# Patient Record
Sex: Female | Born: 1935 | Race: White | Hispanic: No | Smoking: Never smoker
Health system: Southern US, Community
[De-identification: ages and names within clinical notes are randomized; demographics above are authoritative.]

## PROBLEM LIST (undated history)

## (undated) DIAGNOSIS — N6019 Diffuse cystic mastopathy of unspecified breast: Secondary | ICD-10-CM

## (undated) DIAGNOSIS — F0390 Unspecified dementia without behavioral disturbance: Secondary | ICD-10-CM

## (undated) DIAGNOSIS — M6250 Muscle wasting and atrophy, not elsewhere classified, unspecified site: Secondary | ICD-10-CM

## (undated) DIAGNOSIS — M0600 Rheumatoid arthritis without rheumatoid factor, unspecified site: Secondary | ICD-10-CM

## (undated) DIAGNOSIS — C801 Malignant (primary) neoplasm, unspecified: Secondary | ICD-10-CM

## (undated) DIAGNOSIS — M1990 Unspecified osteoarthritis, unspecified site: Secondary | ICD-10-CM

## (undated) DIAGNOSIS — F03A0 Unspecified dementia, mild, without behavioral disturbance, psychotic disturbance, mood disturbance, and anxiety: Secondary | ICD-10-CM

## (undated) DIAGNOSIS — Z8669 Personal history of other diseases of the nervous system and sense organs: Secondary | ICD-10-CM

## (undated) DIAGNOSIS — Z87898 Personal history of other specified conditions: Secondary | ICD-10-CM

## (undated) DIAGNOSIS — K219 Gastro-esophageal reflux disease without esophagitis: Secondary | ICD-10-CM

## (undated) DIAGNOSIS — Z8619 Personal history of other infectious and parasitic diseases: Secondary | ICD-10-CM

## (undated) DIAGNOSIS — I82409 Acute embolism and thrombosis of unspecified deep veins of unspecified lower extremity: Secondary | ICD-10-CM

## (undated) DIAGNOSIS — I10 Essential (primary) hypertension: Secondary | ICD-10-CM

## (undated) DIAGNOSIS — Z853 Personal history of malignant neoplasm of breast: Secondary | ICD-10-CM

## (undated) DIAGNOSIS — E785 Hyperlipidemia, unspecified: Secondary | ICD-10-CM

## (undated) DIAGNOSIS — M810 Age-related osteoporosis without current pathological fracture: Secondary | ICD-10-CM

## (undated) HISTORY — DX: Muscle wasting and atrophy, not elsewhere classified, unspecified site: M62.50

## (undated) HISTORY — DX: Unspecified osteoarthritis, unspecified site: M19.90

## (undated) HISTORY — DX: Personal history of other specified conditions: Z87.898

## (undated) HISTORY — PX: TOTAL ABDOMINAL HYSTERECTOMY W/ BILATERAL SALPINGOOPHORECTOMY: SHX83

## (undated) HISTORY — PX: CHOLECYSTECTOMY: SHX55

## (undated) HISTORY — DX: Unspecified dementia without behavioral disturbance: F03.90

## (undated) HISTORY — DX: Personal history of malignant neoplasm of breast: Z85.3

## (undated) HISTORY — DX: Age-related osteoporosis without current pathological fracture: M81.0

## (undated) HISTORY — DX: Personal history of other diseases of the nervous system and sense organs: Z86.69

## (undated) HISTORY — DX: Rheumatoid arthritis without rheumatoid factor, unspecified site: M06.00

## (undated) HISTORY — PX: LUMBAR LAMINECTOMY: SHX95

## (undated) HISTORY — DX: Diffuse cystic mastopathy of unspecified breast: N60.19

## (undated) HISTORY — PX: APPENDECTOMY: SHX54

## (undated) HISTORY — PX: MASTECTOMY: SHX3

## (undated) HISTORY — DX: Unspecified dementia, mild, without behavioral disturbance, psychotic disturbance, mood disturbance, and anxiety: F03.A0

## (undated) HISTORY — PX: VASCULAR SURGERY: SHX849

## (undated) HISTORY — DX: Hyperlipidemia, unspecified: E78.5

## (undated) HISTORY — DX: Gastro-esophageal reflux disease without esophagitis: K21.9

## (undated) HISTORY — DX: Personal history of other infectious and parasitic diseases: Z86.19

## (undated) HISTORY — DX: Acute embolism and thrombosis of unspecified deep veins of unspecified lower extremity: I82.409

---

## 1995-06-28 HISTORY — PX: LAPAROSCOPIC NISSEN FUNDOPLICATION: SHX1932

## 1999-12-29 DIAGNOSIS — Z87898 Personal history of other specified conditions: Secondary | ICD-10-CM

## 1999-12-29 HISTORY — DX: Personal history of other specified conditions: Z87.898

## 2004-06-03 ENCOUNTER — Ambulatory Visit: Payer: Self-pay

## 2004-10-09 ENCOUNTER — Emergency Department: Payer: Self-pay | Admitting: Unknown Physician Specialty

## 2004-10-10 ENCOUNTER — Other Ambulatory Visit: Payer: Self-pay

## 2005-05-30 ENCOUNTER — Ambulatory Visit: Payer: Self-pay | Admitting: Ophthalmology

## 2005-06-27 DIAGNOSIS — I82409 Acute embolism and thrombosis of unspecified deep veins of unspecified lower extremity: Secondary | ICD-10-CM

## 2005-06-27 HISTORY — DX: Acute embolism and thrombosis of unspecified deep veins of unspecified lower extremity: I82.409

## 2005-07-12 ENCOUNTER — Observation Stay: Payer: Self-pay | Admitting: Internal Medicine

## 2005-07-17 ENCOUNTER — Ambulatory Visit: Payer: Self-pay | Admitting: Internal Medicine

## 2006-03-01 ENCOUNTER — Ambulatory Visit: Payer: Self-pay | Admitting: Internal Medicine

## 2006-05-04 ENCOUNTER — Ambulatory Visit: Payer: Self-pay | Admitting: Rheumatology

## 2007-01-09 ENCOUNTER — Ambulatory Visit: Payer: Self-pay

## 2007-01-18 ENCOUNTER — Ambulatory Visit: Payer: Self-pay

## 2007-09-03 ENCOUNTER — Ambulatory Visit: Payer: Self-pay | Admitting: Internal Medicine

## 2007-11-18 ENCOUNTER — Encounter: Admission: RE | Admit: 2007-11-18 | Discharge: 2007-11-18 | Payer: Self-pay | Admitting: Orthopedic Surgery

## 2007-12-12 ENCOUNTER — Observation Stay (HOSPITAL_COMMUNITY): Admission: RE | Admit: 2007-12-12 | Discharge: 2007-12-14 | Payer: Self-pay | Admitting: Orthopedic Surgery

## 2008-01-14 ENCOUNTER — Encounter: Payer: Self-pay | Admitting: Orthopedic Surgery

## 2008-01-28 ENCOUNTER — Encounter: Payer: Self-pay | Admitting: Orthopedic Surgery

## 2008-07-08 ENCOUNTER — Ambulatory Visit: Payer: Self-pay | Admitting: Internal Medicine

## 2008-07-24 ENCOUNTER — Ambulatory Visit: Payer: Self-pay | Admitting: Unknown Physician Specialty

## 2008-07-27 ENCOUNTER — Ambulatory Visit: Payer: Self-pay | Admitting: Unknown Physician Specialty

## 2008-07-27 HISTORY — PX: ORIF DISTAL RADIUS FRACTURE: SUR927

## 2009-01-05 ENCOUNTER — Ambulatory Visit: Payer: Self-pay | Admitting: Rheumatology

## 2009-09-04 ENCOUNTER — Emergency Department: Payer: Self-pay | Admitting: Emergency Medicine

## 2010-07-12 NOTE — H&P (Signed)
NAME:  [name] [name], [name] [name]             ACCOUNT NO.:  [id number]   MEDICAL RECORD NO.:  [id number]          PATIENT TYPE:  INP   LOCATION:                               FACILITY:  [facility]   PHYSICIAN:  Georges Lynch. Gioffre, M.D.DATE OF BIRTH:  05/18/1930   DATE OF ADMISSION:  12/12/2007  DATE OF DISCHARGE:                              HISTORY & PHYSICAL   CHIEF COMPLAINT:  Lower back with left leg pain.   HISTORY OF PRESENT ILLNESS:  The patient is a 77 year old female with  long term history related to lower back pain.  She developed left leg  pain.  She also has a little bit of right leg pain.  She has a chronic  tired feeling in both legs.  Evaluation with a CT myelogram shows that  she has severe spinal stenosis at L4-5 with a herniated disk at L4-5 on  the left migrating caudally.  The patient has elected to proceed with  the central decompression at L4-5 with a microdiskectomy at L4-5 on the  left by Dr. Darrelyn Hillock at [facility] on October 15.   LABORATORY DATA:  CODEINE, SULFA, PENICILLIN.   CURRENT MEDICATIONS:  1. Amitriptyline 25 mg p.o. q.h.s.  2. Methotrexate 2.5 mg tablets 6 tablets every Tuesday.  3. Folic acid 1 mg a day.  4. Cozaar 100 mg once a day.  5. Metoprolol 100 mg b.i.d.  6. Aspirin 81 mg a day.  7. Vitamin E.  8. Vitamin C.  9. Calcium plus vitamin D.   PAST MEDICAL HISTORY:  1. Hypertension.  Last stress test 5 years previous.  2. Difficulty sleeping, insomnia.  3. 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  4. History of right lower extremity blood clot in 2007 treated with      heparin and Coumadin.  5. History of breast cancer with bilateral mastectomy.  6. History of jaundice 40 years previous, no history of hepatitis.   REVIEW OF SYSTEMS:  NEUROLOGIC:  Negative for any neurologic issues at  this time.  PULMONARY:  Unremarkable.  CARDIOVASCULAR:  Unremarkable for  any recent chest pain, shortness of breath, last stress test 5 years  previous  was reported normal.  Blood pressure medicines have been  stable.  GI:  She has had esophageal hernia repaired in the past.  She  has had jaundice in the past without any other complaints.  GU:  Unremarkable.  ENDOCRINE:  Unremarkable.  HEMATOLOGIC:  She has had a  blood clot, right lower extremity in 2007, treated with heparin and  Coumadin.   PAST SURGICAL HISTORY:  Includes bilateral mastectomy, cholecystectomy,  hysterectomy, esophageal hernia repair venous stripping in her right  lower extremity and bilateral cataracts removed.  Only complication with  anesthesia was significant nausea and vomiting.   FAMILY MEDICAL HISTORY:  Father is deceased from pancreatic cancer.  Mother is deceased from congestive heart failure.   SOCIAL HISTORY:  The patient is a widow.  She has never smoked or  alcohol.  She lives alone in a [place] home.  She will have friends  helping her postoperatively.   PHYSICAL  EXAMINATION:  VITAL SIGNS:  Height 5 feet 5 inches, weight 162,  blood pressure 140/70, pulse 74 and regular, respiratory rate 12 and  unlabored.  The patient is afebrile.  GENERAL:  The patient is a healthy-appearing female, conscious, alert  and appropriate.  She ambulates with a slow gait.  She easily gets  herself on and off the exam table.  HEENT:  Head was normocephalic.  Pupils equal.  Gross hearing was  intact.  NECK:  Supple, no palpable lymphadenopathy, good range of motion.  CHEST:  She had a slightly hyperkyphotic type appearance.  She had equal  and clear lung sounds.  HEART:  Regular rate and rhythm.  ABDOMEN:  Soft, bowel sounds present.  EXTREMITIES:  Upper extremities had good range of motion at shoulders,  elbows and wrists.  Lower extremities had good range of motion of both  hips, knees and ankles.  Straight leg raises were unremarkable.  NEUROLOGIC:  The patient was conscious, alert and appropriate.  She did  have pain in the lower back with range of motion.  She  was intact to  light touch sensation.  She did have a slight weakness in her hip  flexors on the left and knee extension on the left.  She did have a  jerky type muscle strength release in both lower extremities and upper  extremities.  PERIPHERAL VASCULAR:  Carotid pulses were 2+ no bruits.  Radial pulses  were 2+.  Dorsalis pedis pulses were 1+.  She had some diffuse  varicosities.  Calves were soft and nontender.  She had no pigmentation  changes.  BREAST, RECTAL, GU: Exams deferred at this time.   IMPRESSION:  1. Severe spinal stenosis at L4-5 with disk at L4-5 on the left.  2. Hypertension.  3. Insomnia.  4. 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  5. History of deep venous thrombosis right lower extremity in 2007      treated with Coumadin and heparin.  6. History of breast cancer with bilateral mastectomy.  7. Esophageal hernia repair.  8. History of jaundice 40 years previous.  9. Stress test 5 years previous, normal.   PLAN:  The patient will undergo all routine labs and tests prior to  having a central decompressive lumbar laminectomy at L4-5 for spinal  stenosis and a microdiskectomy at L4-5 on the left for a herniated disk  by Windy Fast A. Darrelyn Hillock, M.D. at [facility] on December 12, 2007.  This patient has been evaluated by her primary care physician at  [facility] and she has been cleared for this surgical procedure.      Jamelle Rushing, P.A.    ______________________________  Georges Lynch Darrelyn Hillock, M.D.    RWK/MEDQ  D:  12/10/2007  T:  12/10/2007  Job:  [id number]

## 2010-07-12 NOTE — Op Note (Signed)
NAMESHONI, QUIJAS              ACCOUNT NO.:  [id number]   MEDICAL RECORD NO.:  [id number]          PATIENT TYPE:  INP   LOCATION:  1621                         FACILITY:  [facility]   PHYSICIAN:  Georges Lynch. Gioffre, M.D.DATE OF BIRTH:  1931-01-28   DATE OF PROCEDURE:  12/12/2007  DATE OF DISCHARGE:                               OPERATIVE REPORT   SURGEON:  Georges Lynch. Darrelyn Hillock, M.D.   ASSISTANT:  Marlowe Kays, M.D.   PREOPERATIVE DIAGNOSES:  1. Spinal stenosis at L3-4.  2. Spinal stenosis at L4-5.  3. Questionable soft herniated disk at L4-5.   POSTOPERATIVE DIAGNOSES:  1. Severe spinal stenosis at L3-4.  2. Severe spinal stenosis at L4-5.   OPERATIONS:  1. Decompressive lumbar laminectomy at L3-4.  2. Decompressive lumbar laminectomy at L4-5.  3. Bilateral foraminotomies at both levels.   PROCEDURE IN DETAIL:  Under general anesthesia with the patient on  spinal frame she had 1 gram of IV Ancef.  At this time a sterile prep  and draping of the back was carried out.  Two needles were placed in the  back for localization purposes and an x-ray was taken.  We first  identified the L3-4, L4-5 spaces.  We then separated the muscle from the  lamina and spinous processes bilaterally.  Following that we went down  and first took another x-ray to verify our position.  We then went down  and inserted self-retaining McCullough retractors.  We then removed the  spinous processes of L3 and L4 at this time.  We then went down and  completed our central hemilaminectomies and identified the ligamentum  flavum.  It was quite tight at L3-4 as well as at 4-5.  Following that  we went down and brought the microscope in, removed the ligamentum  flavum, identified the dura and protected the dura.  We then went and  made sure we decompressed both levels.  We were able now to easily get  the hockey-stick proximally and distally without any resistance.  So we  were wide open proximally and distally.   We went out laterally and  cleaned out the lateral recesses.  We did foraminotomies at bilaterally  as well.  We inspected the 4-5 disk.  There was no herniated disk.  This  was all hard material and mainly from the facets.  We cleaned that out  well.  The nerve roots now were free.  There were no other obstructions.  We thoroughly irrigated out the area, loosely applied some  thrombin-soaked Gelfoam and closed the wound in layers in the usual  fashion except I left a small deep distal part of the wound open for  drainage purposes.  Subcu was closed with 0 Vicryl, skin was closed with  metal staples.  A sterile Neosporin dressing was applied.           ______________________________  Georges Lynch Darrelyn Hillock, M.D.     RAG/MEDQ  D:  12/12/2007  T:  12/13/2007  Job:  [id number]   cc:   Dr Alonna Buckler  [facility]  [place]

## 2010-08-03 ENCOUNTER — Ambulatory Visit: Payer: Self-pay | Admitting: Internal Medicine

## 2010-11-29 LAB — COMPREHENSIVE METABOLIC PANEL
Potassium: 3.9
Sodium: 142
Total Bilirubin: 0.6
Total Protein: 6.2

## 2010-11-29 LAB — URINE CULTURE: Colony Count: NO GROWTH

## 2010-11-29 LAB — URINALYSIS, ROUTINE W REFLEX MICROSCOPIC
Glucose, UA: NEGATIVE
Ketones, ur: NEGATIVE
Specific Gravity, Urine: 1.015
Urobilinogen, UA: 0.2

## 2010-11-29 LAB — HEMOGLOBIN AND HEMATOCRIT, BLOOD
HCT: 36.1
HCT: 39.6
Hemoglobin: 12.3
Hemoglobin: 13.3

## 2010-11-29 LAB — CBC
MCV: 116.5 — ABNORMAL HIGH
Platelets: 213
RDW: 19.8 — ABNORMAL HIGH
WBC: 7

## 2010-11-29 LAB — DIFFERENTIAL
Eosinophils Absolute: 0.1
Neutro Abs: 5
Neutrophils Relative %: 71

## 2010-11-29 LAB — URINE MICROSCOPIC-ADD ON

## 2010-11-29 LAB — PROTIME-INR: INR: 1

## 2011-02-17 ENCOUNTER — Emergency Department: Payer: Self-pay | Admitting: Emergency Medicine

## 2011-02-24 ENCOUNTER — Inpatient Hospital Stay: Payer: Self-pay | Admitting: Internal Medicine

## 2011-03-01 LAB — CREATININE, SERUM: EGFR (African American): >60

## 2011-11-17 ENCOUNTER — Emergency Department: Payer: Self-pay | Admitting: Unknown Physician Specialty

## 2011-11-17 LAB — CBC
MCH: 40.4 pg — ABNORMAL HIGH (ref 26.0–34.0)
MCHC: 35 g/dL (ref 32.0–36.0)
MCV: 116 fL — ABNORMAL HIGH (ref 80–100)
Platelet: 142 10*3/uL — ABNORMAL LOW (ref 150–440)
RDW: 15.5 % — ABNORMAL HIGH (ref 11.5–14.5)
WBC: 6.8 10*3/uL (ref 3.6–11.0)

## 2011-11-17 LAB — URINALYSIS, COMPLETE
Glucose,UR: NEGATIVE mg/dL (ref 0–75)
Protein: 30
RBC,UR: 8 /HPF (ref 0–5)
Specific Gravity: 1.028 (ref 1.003–1.030)
WBC UR: 9 /HPF (ref 0–5)

## 2011-11-17 LAB — COMPREHENSIVE METABOLIC PANEL
Alkaline Phosphatase: 79 U/L (ref 50–136)
BUN: 20 mg/dL — ABNORMAL HIGH (ref 7–18)
Bilirubin,Total: 0.5 mg/dL (ref 0.2–1.0)
Chloride: 99 mmol/L (ref 98–107)
Co2: 28 mmol/L (ref 21–32)
Creatinine: 0.91 mg/dL (ref 0.60–1.30)
EGFR (African American): >60
EGFR (Non-African Amer.): >60
Glucose: 103 mg/dL — ABNORMAL HIGH (ref 65–99)
Osmolality: 275 (ref 275–301)
Sodium: 136 mmol/L (ref 136–145)
Total Protein: 7.5 g/dL (ref 6.4–8.2)

## 2012-05-04 ENCOUNTER — Emergency Department: Payer: Self-pay | Admitting: Internal Medicine

## 2012-05-04 LAB — URINALYSIS, COMPLETE
Blood: NEGATIVE
Glucose,UR: NEGATIVE mg/dL (ref 0–75)
Ketone: NEGATIVE
Nitrite: NEGATIVE
Ph: 7 (ref 4.5–8.0)
Protein: NEGATIVE
RBC,UR: <1 /HPF (ref 0–5)
Squamous Epithelial: <1

## 2012-05-04 LAB — COMPREHENSIVE METABOLIC PANEL
Albumin: 2.7 g/dL — ABNORMAL LOW (ref 3.4–5.0)
Alkaline Phosphatase: 64 U/L (ref 50–136)
BUN: 14 mg/dL (ref 7–18)
Calcium, Total: 6.7 mg/dL — CL (ref 8.5–10.1)
EGFR (African American): >60
SGOT(AST): 24 U/L (ref 15–37)
SGPT (ALT): 23 U/L (ref 12–78)

## 2012-05-04 LAB — CBC
HGB: 12 g/dL (ref 12.0–16.0)
MCH: 40.1 pg — ABNORMAL HIGH (ref 26.0–34.0)
MCHC: 33.8 g/dL (ref 32.0–36.0)
MCV: 118 fL — ABNORMAL HIGH (ref 80–100)
RDW: 15.1 % — ABNORMAL HIGH (ref 11.5–14.5)
WBC: 10.1 10*3/uL (ref 3.6–11.0)

## 2012-05-04 LAB — CK TOTAL AND CKMB (NOT AT ARMC): CK, Total: 48 U/L (ref 21–215)

## 2013-05-13 ENCOUNTER — Ambulatory Visit: Payer: Self-pay | Admitting: Rheumatology

## 2013-07-08 DIAGNOSIS — M48062 Spinal stenosis, lumbar region with neurogenic claudication: Secondary | ICD-10-CM | POA: Insufficient documentation

## 2013-07-08 DIAGNOSIS — M47816 Spondylosis without myelopathy or radiculopathy, lumbar region: Secondary | ICD-10-CM | POA: Insufficient documentation

## 2013-07-08 DIAGNOSIS — M5416 Radiculopathy, lumbar region: Secondary | ICD-10-CM | POA: Insufficient documentation

## 2013-10-01 ENCOUNTER — Emergency Department: Payer: Self-pay | Admitting: Emergency Medicine

## 2013-10-01 LAB — CBC WITH DIFFERENTIAL/PLATELET
BASOS ABS: 0.2 10*3/uL — AB (ref 0.0–0.1)
Basophil %: 2.2 %
EOS PCT: 3.3 %
Eosinophil #: 0.3 10*3/uL (ref 0.0–0.7)
HCT: 44.8 % (ref 35.0–47.0)
HGB: 14.5 g/dL (ref 12.0–16.0)
Lymphocyte #: 1.7 10*3/uL (ref 1.0–3.6)
Lymphocyte %: 18 %
MCH: 38.7 pg — ABNORMAL HIGH (ref 26.0–34.0)
MCHC: 32.4 g/dL (ref 32.0–36.0)
MCV: 120 fL — ABNORMAL HIGH (ref 80–100)
MONO ABS: 1.1 x10 3/mm — AB (ref 0.2–0.9)
Monocyte %: 12.2 %
NEUTROS ABS: 6 10*3/uL (ref 1.4–6.5)
Neutrophil %: 64.3 %
Platelet: 210 10*3/uL (ref 150–440)
RBC: 3.75 10*6/uL — AB (ref 3.80–5.20)
RDW: 15.6 % — ABNORMAL HIGH (ref 11.5–14.5)
WBC: 9.3 10*3/uL (ref 3.6–11.0)

## 2013-10-01 LAB — URINALYSIS, COMPLETE
Bacteria: NONE SEEN
Bilirubin,UR: NEGATIVE
Blood: NEGATIVE
Glucose,UR: NEGATIVE mg/dL (ref 0–75)
Ketone: NEGATIVE
Leukocyte Esterase: NEGATIVE
Nitrite: NEGATIVE
PROTEIN: NEGATIVE
Ph: 7 (ref 4.5–8.0)
SPECIFIC GRAVITY: 1.006 (ref 1.003–1.030)
Squamous Epithelial: NONE SEEN

## 2013-10-01 LAB — BASIC METABOLIC PANEL
Anion Gap: 8 (ref 7–16)
BUN: 13 mg/dL (ref 7–18)
CALCIUM: 8.6 mg/dL (ref 8.5–10.1)
CHLORIDE: 105 mmol/L (ref 98–107)
CREATININE: 0.91 mg/dL (ref 0.60–1.30)
Co2: 25 mmol/L (ref 21–32)
GFR CALC NON AF AMER: 59 — AB
Glucose: 95 mg/dL (ref 65–99)
OSMOLALITY: 276 (ref 275–301)
Potassium: 3.8 mmol/L (ref 3.5–5.1)
Sodium: 138 mmol/L (ref 136–145)

## 2013-10-01 LAB — TROPONIN I

## 2013-10-06 DIAGNOSIS — M810 Age-related osteoporosis without current pathological fracture: Secondary | ICD-10-CM | POA: Insufficient documentation

## 2013-10-06 DIAGNOSIS — K219 Gastro-esophageal reflux disease without esophagitis: Secondary | ICD-10-CM | POA: Insufficient documentation

## 2013-10-06 DIAGNOSIS — I10 Essential (primary) hypertension: Secondary | ICD-10-CM | POA: Insufficient documentation

## 2013-10-06 DIAGNOSIS — E785 Hyperlipidemia, unspecified: Secondary | ICD-10-CM | POA: Insufficient documentation

## 2013-10-21 ENCOUNTER — Emergency Department: Payer: Self-pay | Admitting: Emergency Medicine

## 2013-11-26 ENCOUNTER — Ambulatory Visit: Payer: Self-pay | Admitting: Internal Medicine

## 2013-12-03 ENCOUNTER — Ambulatory Visit: Payer: Self-pay | Admitting: Internal Medicine

## 2014-06-21 NOTE — H&P (Signed)
PATIENT NAME:  [name] [name], [name] [name] MR#:  [id number] DATE OF BIRTH:  Jul 17, 1930  DATE OF ADMISSION:  02/24/2011  ER REFERRING PHYSICIAN: Lenise Arena, MD   PRIMARY CARE PHYSICIAN: Apolonio Schneiders, MD    CHIEF COMPLAINT: Unsteady gait, weakness, generalized weakness, status post fall last week.   HISTORY OF PRESENT ILLNESS: The patient is a 81 year old white female who reports that she was thought to have early dementia and was started on Aricept and Namenda a few weeks ago. She had significant side effects to these medications, and Dr. Arline Asp told her to stop these; but she reports that she had the medications in her medication box and is not sure if she is still taking these or not. However, last week she reports that she was trying to walk and carrying a whole bunch of stuff in both hands and lost balance and fell backwards on her head. She came to the Emergency Department and had a CT scan of the head which was negative. She was sent home. The patient lives at home alone and since then has not done well. The patient reports that her blood pressure has been elevated in the 200s. She reports that she is not able to ambulate, and when she does ambulate she is very unsteady on her feet and she is very weak. She came to the ED today and reported that she had difficulty with voiding. They checked a bladder scan, and she was noted to have urinary retention greater than 400 mL, so a Foley was placed. The patient also had a CT scan of the head which did not show any acute abnormalities. Due to her having difficulty with walking, unsteady gait with a fall last week, I am asked to admit the patient. She otherwise reports that she has not had any fevers.  She had chills earlier this morning. She was unsure if it was chills or just shaking from being very weak. She has not had any cough, no chest pains, no palpitations, no PND. No orthopnea. No abdominal pain. She does report liquid bowel movements for the past few days,  but she reports that she goes once or twice. She denies any urinary burning. She does report that earlier today she had felt like she was unable to void. She otherwise denies any swelling in the legs. No asymmetrical weakness. No visual difficulties.   PAST MEDICAL HISTORY: Significant for: 1. Breast cancer, status post bilateral mastectomy. 2. Osteoporosis. 3. Rheumatoid arthritis. 4. Hypertension.  5. Possible early dementia.   PAST SURGICAL HISTORY:  1. Status post bilateral mastectomy.  2. Status post right leg vein stripping. 3. Status post cholecystectomy.  4. Status post hysterectomy.  5. Status post appendectomy.    ALLERGIES: Codeine, Disalcid, Evista,  Fosamax, Lipitor, penicillin, sulfa, Voltaren and tape.   HOME MEDICATIONS:    1. Folic acid 1 mg daily.  2. Celexa 10 mg daily.  3. Amitriptyline 75 mg daily.  4. Losartan 100 mg, 1 tab p.o. daily.  5. Methotrexate 2.5 mg tabs weekly.  6. Metoprolol tartrate 100 mg, 1 tab p.o. b.i.d.  7. Vitamin C 1000 mg, 1 tab p.o. daily.   SOCIAL HISTORY: She does not smoke. She does not drink. No drugs.   FAMILY HISTORY: Mother died from heart failure. Father died of a massive heart attack.   REVIEW OF SYSTEMS: CONSTITUTIONAL: Denies any fevers. Complains of generalized fatigue, weakness. No pain. No weight loss. No weight gain. EYES: No blurred or double vision. No pain.  No redness. No inflammation. No glaucoma. No cataracts. ENT: No tinnitus. No ear pain. No hearing loss. No seasonal or year-round allergies. No nasal discharge. No snoring. No difficulty with swallowing. RESPIRATORY: No cough. No wheezing. No hemoptysis. No chronic obstructive pulmonary disease. No tuberculosis. CARDIOVASCULAR: No chest pain. No orthopnea. No edema. No arrhythmia. No syncope. GASTROINTESTINAL: No nausea, vomiting, diarrhea. No abdominal pain. No hematemesis. No melena. No ulcers. GU: Denies any dysuria, hematuria, renal calculus, frequency or  incontinence. ENDOCRINE: Denies any polydipsia, nocturia, or thyroid problems. No increase in sweating, heat or cold intolerance. HEME/LYMPH: Denies any anemia, easy bruisability, or bleeding. SKIN: No acne. No rash. No changes in mole, hair or skin.  MUSCULOSKELETAL: Denies pain in the neck, back or shoulder. NEUROLOGICAL: No numbness. No cerebrovascular accident. No transient ischemic attack. No seizure. Does have some memory loss. PSYCHIATRIC: No anxiety. No insomnia. No ADD.   PHYSICAL EXAMINATION:  VITAL SIGNS: Temperature 98, pulse 74, respiratory rate 22. Initial blood pressure on presentation was 224/87.   GENERAL: The patient appears her stated age, appears very weak.   HEENT: Head atraumatic, normocephalic. Pupils are equally round, reactive to light and accommodation. Extraocular movements are intact. Oropharynx is clear without any exudates. Nasal exam shows no erythema or swelling. Ear exam shows no erythema or swelling.   NECK: Atraumatic. No carotid bruits. No thyromegaly.   CARDIOVASCULAR: Regular rate and rhythm. No murmurs, rubs, clicks, or gallops. PMI is not displaced.   LUNGS: Clear to auscultation bilaterally without any rales, rhonchi, wheezing. No accessory muscle usage.   ABDOMEN: Soft, nontender, nondistended. Positive bowel sounds x4.   EXTREMITIES: No clubbing, cyanosis, or edema.   NEUROLOGICAL: Awake, alert, oriented x3. Cranial nerves II through XII are grossly intact. Has generalized weakness but no asymmetrical weakness. Reflexes are 2+.   SKIN: There is no rash.   LYMPHATICS: No lymph nodes palpable.   VASCULAR: Good DP, PT pulses.   GU: Deferred.   RECTAL: Deferred.  PSYCHIATRIC: Not anxious or depressed.  ASSESSMENT AND PLAN: The patient is a 81 year old white female with possible early dementia who has had generalized weakness, difficulty with walking, and fell last week.   1. Progressive weakness and unsteadiness on her feet:  Possibly due to  medications. The patient reports that she was recently started on Namenda and Aricept, which she was told to stop due to side effects, but was possibly still taking this due to these medications being in her medication box, so we will not continue these. The patient also could have a generalized weakness and unsteadiness due to some dehydration. We will give her IV fluids. I will check a TSH and B12 level to make sure there is no metabolic cause for her symptoms.  2. Urinary retention: Could be just due to the patient not being mobile and not getting around too much. At this time she has a Foley in place. She will need a voiding trial in the a.m. She has been on amitriptyline, which can cause urinary retention. I will hold amitriptyline for the time being.  3. Hypertension with wide pulse pressures: Chest x-ray is negative for any mediastinal enlargement. At this time, this could be related to the patient being anxious about her condition. We will continue losartan, metoprolol. We will place her on p.r.n. hydralazine, adjust her regimen as needed.  4. Possible early dementia: Hold Aricept and Namenda for the time being.  5. Rheumatoid arthritis: Continue methotrexate and folic acid as taking at home.  CODE STATUS: FULL CODE.   TIME SPENT: 35 minutes.  ____________________________ Lafonda Mosses Posey Pronto, MD shp:cbb D: 02/24/2011 14:32:04 ET T: 02/24/2011 15:11:17 ET JOB#: [id number]  cc: Rasheedah Reis H. Posey Pronto, MD, <Dictator> Vianne Bulls. Arline Asp, MD Alric Seton MD ELECTRONICALLY SIGNED 03/04/2011 15:22

## 2014-06-21 NOTE — Discharge Summary (Signed)
PATIENT NAME:  [name] [name], [name] [name] MR#:  [id number] DATE OF BIRTH:  [birth year]  DATE OF ADMISSION:  02/27/2011 DATE OF DISCHARGE:  03/01/2011  ADMITTING DIAGNOSIS: Generalized weakness.   DISCHARGE DIAGNOSES:  1. Generalized weakness of unclear etiology, probably deconditioning, questionable brain concussion related. No CVA on MRI status post fall.  2. Abnormal gait. 3. Hypertension, malignant.  4. Renal insufficiency, resolved on IV fluid rehydration.  5. History of breast carcinoma.  6. Osteoporosis.  7. Rheumatoid arthritis. 8. Osteoarthritis. 9. Dementia.  10. Depression.   DISCHARGE CONDITION: Stable.   DISCHARGE MEDICATIONS: The patient is to resume her outpatient medications which are:  1. Methotrexate 2.5 mg tablet 6 pills once a week. 2. Folic acid 1 mg p.o. daily. 3. Vitamin C 1000 mg daily. 4. Losartan 100 mg p.o. daily. 5. Metoprolol tartrate 100 mg p.o. twice daily. 6. Alendronate, unknown dose, weekly.   ADDITIONAL MEDICATIONS:  1. Celexa 20 mg p.o. daily. This is a new dose up from 10 mg p.o. daily.  2. Norvasc 10 mg p.o. daily.  3. Tylenol 650 mg p.o. every four hours as needed.  4. Catapres-TTS one topically weekly.  5. Hydralazine 50 mg p.o. 4 times daily.  DO NOT TAKE: The patient was advised not to take amitriptyline because of concerns of falls and weakness.   HOME OXYGEN: None.   DIET: 2 gram salt, mechanical soft.   PHYSICAL ACTIVITY LIMITATIONS: As tolerated.   REFERRAL: Physical therapy 2 to 7 times a week.  FOLLOW-UP: Follow-up appointment with Dr. Apolonio Schneiders in two days after discharge.    CONSULTANTS:  1. Care Management   2. Physical therapy    RADIOLOGIC STUDIES:  1. Chest x-ray, portable, single view, 02/24/2011, no acute cardiopulmonary abnormality.  2. CT of head without contrast 02/24/2011 showed chronic involutional changes without evidence of acute abnormalities. A scalp hematoma is appreciated along the posterior aspect on the  left. 3. MRI of brain without contrast 02/26/2011 showed no acute intracranial abnormality.  REASON FOR ADMISSION: The patient is a 81 year old Caucasian female with past medical history significant for history of rheumatoid arthritis and osteoarthritis who presented to the hospital with complaints of unsteady gait, weakness, generalized weakness as well as fall a week ago prior to coming to the hospital. Please refer to Dr. Serita Grit admission note on 02/24/2011.   PHYSICAL EXAMINATION: On arrival to the Emergency Room, the patient's temperature was 98, pulse 74, respiratory rate 22, blood pressure 224/87. Physical exam was unremarkable.   LABORATORY, DIAGNOSTIC, AND RADIOLOGICAL DATA: The patient's chest x-ray was unremarkable as well as her head CT. Lab data 02/24/2011 showed glucose elevated to 100, otherwise unremarkable BMP. The patient's liver enzymes were remarkable for mildly elevated AST to 43 and total bilirubin to 1.2. Cardiac enzymes, one set, was within normal limits. CK total was normal at 141. TSH was normal at 0.976. CBC white blood cell count 9.8, hemoglobin 16.0, platelets 209 with high MCV at 117. Influenza test was negative. Urinalysis was unremarkable. Vitamin B12 level was normal at 472. EKG showed normal sinus rhythm at 68 beats per minute, left axis deviation, moderate voltage criteria for LVH, no acute ST-T changes noted.   HOSPITAL COURSE: The patient was admitted to the hospital. Because of her generalized weakness as well as poor gait, physical therapist was consulted who recommended rehab placement. The patient was exercised while she was in the hospital. It was felt that the patient's generalized weakness possibly was related to rheumatoid arthritis, osteoarthritis, deconditioning,  as well as fall and questionable concussion. However, no stroke was noted on CT scan of her head as well as MRI of her brain. The patient is to continue aspirin therapy as well as physical therapy  eventually with hopes of returning back home. She will be discharged to skilled nursing facility today on 03/01/2011. On the day of discharge, the patient's temperature is 98.7, pulse 66, respiration rate 20, blood pressure 131/66, saturation 96% on room air at rest.  1. The patient was noted to have malignant hypertension on arrival to the hospital. Her blood pressure was poorly controlled very long time even despite advancing her blood pressure medications. It was felt that the patient's severe malignant hypertension requiring multiple medications should be addressed maybe even radiologically. The patient may benefit from evaluation of her renal arteries at some point. However, with the addition of all new blood pressure medications her blood pressure improved and today, as mentioned above, the patient's blood pressure systolic was around 976. It is recommended to advance the patient's blood pressure medications as necessary to maintain her at reasonable blood pressure readings.  2. In regards to osteoarthritis as well as rheumatoid arthritis, no changes were made in medication management. The patient did complain of some intermittent pains such as right foot, however, those pains did not preclude her to ambulate and it seemed to be that it was well controlled with Tylenol as needed.  3. The patient also seemed to be depressed. The patient's Celexa was advanced to 20 mg p.o. daily dose with improvement of her depressive mood.  4. As mentioned above, the patient had mild elevation of AST. Initially on arrival to the hospital, 02/24/2011, the patient's AST was slightly elevated to 43. With following the patient's liver enzymes, the patient's AST normalized to 35 by 02/26/2011. It was unclear why the patient's AST was elevated, however, it could have been that patient had mild elevation of CK total which was reflected by elevation of AST.   The patient is being discharged in stable condition with the  above-mentioned medications and follow-up.      TIME SPENT: 40 minutes.   ____________________________ Theodoro Grist, MD rv:drc D: 03/01/2011 12:46:06 ET T: 03/01/2011 13:12:53 ET JOB#: [id number]  cc: Theodoro Grist, MD, <Dictator> Vianne Bulls. Arline Asp, MD Theodoro Grist MD ELECTRONICALLY SIGNED 03/27/2011 7:15

## 2015-03-10 DIAGNOSIS — M0600 Rheumatoid arthritis without rheumatoid factor, unspecified site: Secondary | ICD-10-CM | POA: Diagnosis not present

## 2015-04-05 DIAGNOSIS — H1859 Other hereditary corneal dystrophies: Secondary | ICD-10-CM | POA: Diagnosis not present

## 2015-04-05 DIAGNOSIS — H018 Other specified inflammations of eyelid: Secondary | ICD-10-CM | POA: Diagnosis not present

## 2015-05-26 DIAGNOSIS — M0600 Rheumatoid arthritis without rheumatoid factor, unspecified site: Secondary | ICD-10-CM | POA: Diagnosis not present

## 2015-05-27 DIAGNOSIS — M0600 Rheumatoid arthritis without rheumatoid factor, unspecified site: Secondary | ICD-10-CM | POA: Diagnosis not present

## 2015-06-02 DIAGNOSIS — G5601 Carpal tunnel syndrome, right upper limb: Secondary | ICD-10-CM | POA: Diagnosis not present

## 2015-06-02 DIAGNOSIS — M150 Primary generalized (osteo)arthritis: Secondary | ICD-10-CM | POA: Diagnosis not present

## 2015-06-02 DIAGNOSIS — M0600 Rheumatoid arthritis without rheumatoid factor, unspecified site: Secondary | ICD-10-CM | POA: Diagnosis not present

## 2015-06-02 DIAGNOSIS — M810 Age-related osteoporosis without current pathological fracture: Secondary | ICD-10-CM | POA: Diagnosis not present

## 2015-09-08 DIAGNOSIS — M0600 Rheumatoid arthritis without rheumatoid factor, unspecified site: Secondary | ICD-10-CM | POA: Diagnosis not present

## 2015-11-09 ENCOUNTER — Emergency Department: Payer: PPO

## 2015-11-09 ENCOUNTER — Observation Stay
Admission: EM | Admit: 2015-11-09 | Discharge: 2015-11-10 | Disposition: A | Discharge status: Home / Self Care | Payer: PPO | Attending: Internal Medicine | Admitting: Internal Medicine

## 2015-11-09 DIAGNOSIS — Z90710 Acquired absence of both cervix and uterus: Secondary | ICD-10-CM | POA: Diagnosis not present

## 2015-11-09 DIAGNOSIS — E785 Hyperlipidemia, unspecified: Secondary | ICD-10-CM | POA: Insufficient documentation

## 2015-11-09 DIAGNOSIS — K219 Gastro-esophageal reflux disease without esophagitis: Secondary | ICD-10-CM | POA: Diagnosis not present

## 2015-11-09 DIAGNOSIS — R079 Chest pain, unspecified: Secondary | ICD-10-CM | POA: Diagnosis present

## 2015-11-09 DIAGNOSIS — R0789 Other chest pain: Principal | ICD-10-CM | POA: Insufficient documentation

## 2015-11-09 DIAGNOSIS — Z880 Allergy status to penicillin: Secondary | ICD-10-CM | POA: Insufficient documentation

## 2015-11-09 DIAGNOSIS — M069 Rheumatoid arthritis, unspecified: Secondary | ICD-10-CM | POA: Insufficient documentation

## 2015-11-09 DIAGNOSIS — Z809 Family history of malignant neoplasm, unspecified: Secondary | ICD-10-CM | POA: Diagnosis not present

## 2015-11-09 DIAGNOSIS — Z86718 Personal history of other venous thrombosis and embolism: Secondary | ICD-10-CM | POA: Diagnosis not present

## 2015-11-09 DIAGNOSIS — F329 Major depressive disorder, single episode, unspecified: Secondary | ICD-10-CM | POA: Insufficient documentation

## 2015-11-09 DIAGNOSIS — I700 Atherosclerosis of aorta: Secondary | ICD-10-CM | POA: Diagnosis not present

## 2015-11-09 DIAGNOSIS — Z882 Allergy status to sulfonamides status: Secondary | ICD-10-CM | POA: Diagnosis not present

## 2015-11-09 DIAGNOSIS — I517 Cardiomegaly: Secondary | ICD-10-CM | POA: Diagnosis not present

## 2015-11-09 DIAGNOSIS — E119 Type 2 diabetes mellitus without complications: Secondary | ICD-10-CM | POA: Diagnosis not present

## 2015-11-09 DIAGNOSIS — I209 Angina pectoris, unspecified: Secondary | ICD-10-CM | POA: Diagnosis not present

## 2015-11-09 DIAGNOSIS — Z8249 Family history of ischemic heart disease and other diseases of the circulatory system: Secondary | ICD-10-CM | POA: Insufficient documentation

## 2015-11-09 DIAGNOSIS — Z7982 Long term (current) use of aspirin: Secondary | ICD-10-CM | POA: Insufficient documentation

## 2015-11-09 DIAGNOSIS — Z79899 Other long term (current) drug therapy: Secondary | ICD-10-CM | POA: Diagnosis not present

## 2015-11-09 DIAGNOSIS — I10 Essential (primary) hypertension: Secondary | ICD-10-CM | POA: Insufficient documentation

## 2015-11-09 DIAGNOSIS — Z9049 Acquired absence of other specified parts of digestive tract: Secondary | ICD-10-CM | POA: Diagnosis not present

## 2015-11-09 DIAGNOSIS — R0602 Shortness of breath: Secondary | ICD-10-CM | POA: Diagnosis not present

## 2015-11-09 HISTORY — DX: Essential (primary) hypertension: I10

## 2015-11-09 HISTORY — DX: Malignant (primary) neoplasm, unspecified: C80.1

## 2015-11-09 LAB — BASIC METABOLIC PANEL
Anion gap: 9 (ref 5–15)
BUN: 13 mg/dL (ref 6–20)
CHLORIDE: 103 mmol/L (ref 101–111)
CO2: 26 mmol/L (ref 22–32)
CREATININE: 0.66 mg/dL (ref 0.44–1.00)
Calcium: 9.3 mg/dL (ref 8.9–10.3)
GFR calc Af Amer: >60 mL/min (ref >60)
GFR calc non Af Amer: >60 mL/min (ref >60)
Glucose, Bld: 114 mg/dL — ABNORMAL HIGH (ref 65–99)
Potassium: 3.9 mmol/L (ref 3.5–5.1)
SODIUM: 138 mmol/L (ref 135–145)

## 2015-11-09 LAB — TROPONIN I
Troponin I: <0.03 ng/mL (ref <0.03)
Troponin I: <0.03 ng/mL (ref <0.03)
Troponin I: <0.03 ng/mL (ref <0.03)

## 2015-11-09 LAB — CBC
HCT: 47.1 % — ABNORMAL HIGH (ref 35.0–47.0)
Hemoglobin: 16.6 g/dL — ABNORMAL HIGH (ref 12.0–16.0)
MCH: 40.9 pg — AB (ref 26.0–34.0)
MCHC: 35.2 g/dL (ref 32.0–36.0)
MCV: 116.3 fL — AB (ref 80.0–100.0)
PLATELETS: 196 10*3/uL (ref 150–440)
RBC: 4.05 MIL/uL (ref 3.80–5.20)
RDW: 15.2 % — AB (ref 11.5–14.5)
WBC: 13.6 10*3/uL — ABNORMAL HIGH (ref 3.6–11.0)

## 2015-11-09 MED ORDER — ASPIRIN EC 81 MG PO TBEC
81.0000 mg | DELAYED_RELEASE_TABLET | Freq: Every day | ORAL | Status: DC
  Administered 2015-11-09: 81 mg via ORAL
  Filled 2015-11-09 (×2): qty 1

## 2015-11-09 MED ORDER — FOLIC ACID 1 MG PO TABS
1.0000 mg | ORAL_TABLET | Freq: Every day | ORAL | Status: DC
  Administered 2015-11-10: 1 mg via ORAL
  Filled 2015-11-09: qty 1

## 2015-11-09 MED ORDER — HYDRALAZINE HCL 20 MG/ML IJ SOLN
10.0000 mg | Freq: Four times a day (QID) | INTRAMUSCULAR | Status: DC | PRN
  Administered 2015-11-09: 10 mg via INTRAVENOUS
  Filled 2015-11-09: qty 1

## 2015-11-09 MED ORDER — ENOXAPARIN SODIUM 40 MG/0.4ML ~~LOC~~ SOLN
40.0000 mg | SUBCUTANEOUS | Status: DC
  Administered 2015-11-09: 40 mg via SUBCUTANEOUS
  Filled 2015-11-09: qty 0.4

## 2015-11-09 MED ORDER — ASPIRIN EC 325 MG PO TBEC
325.0000 mg | DELAYED_RELEASE_TABLET | Freq: Once | ORAL | Status: AC
Start: 2015-11-09 — End: 2015-11-09
  Administered 2015-11-09: 325 mg via ORAL
  Filled 2015-11-09: qty 1

## 2015-11-09 MED ORDER — LOSARTAN POTASSIUM 50 MG PO TABS
100.0000 mg | ORAL_TABLET | Freq: Every day | ORAL | Status: DC
  Administered 2015-11-10: 100 mg via ORAL
  Filled 2015-11-09: qty 2

## 2015-11-09 MED ORDER — ONDANSETRON HCL 4 MG/2ML IJ SOLN: 4.0000 mg | Freq: Four times a day (QID) | INTRAMUSCULAR | Status: DC | PRN

## 2015-11-09 MED ORDER — ACETAMINOPHEN 325 MG PO TABS: 650.0000 mg | ORAL_TABLET | ORAL | Status: DC | PRN

## 2015-11-09 MED ORDER — ISOSORBIDE MONONITRATE ER 30 MG PO TB24: 30.0000 mg | ORAL_TABLET | Freq: Every day | ORAL | Status: DC

## 2015-11-09 MED ORDER — AMLODIPINE BESYLATE 10 MG PO TABS
10.0000 mg | ORAL_TABLET | Freq: Every day | ORAL | Status: DC
  Administered 2015-11-10: 10 mg via ORAL
  Filled 2015-11-09: qty 1

## 2015-11-09 MED ORDER — METHOTREXATE 2.5 MG PO TABS: 15.0000 mg | ORAL_TABLET | ORAL | Status: DC

## 2015-11-09 MED ORDER — METOPROLOL TARTRATE 50 MG PO TABS
100.0000 mg | ORAL_TABLET | Freq: Two times a day (BID) | ORAL | Status: DC
  Administered 2015-11-09 – 2015-11-10 (×2): 100 mg via ORAL
  Filled 2015-11-09 (×2): qty 2

## 2015-11-09 MED ORDER — SERTRALINE HCL 100 MG PO TABS
100.0000 mg | ORAL_TABLET | Freq: Every day | ORAL | Status: DC
  Administered 2015-11-10: 100 mg via ORAL
  Filled 2015-11-09: qty 1

## 2015-11-09 MED ORDER — NITROGLYCERIN 2 % TD OINT
1.0000 [in_us] | TOPICAL_OINTMENT | Freq: Once | TRANSDERMAL | Status: AC
  Administered 2015-11-09: 1 [in_us] via TOPICAL
  Filled 2015-11-09: qty 1

## 2015-11-09 MED ORDER — VITAMIN C 500 MG PO TABS
1000.0000 mg | ORAL_TABLET | Freq: Every day | ORAL | Status: DC
  Administered 2015-11-10: 1000 mg via ORAL
  Filled 2015-11-09: qty 2

## 2015-11-09 NOTE — ED Triage Notes (Signed)
Pt to ED from home via EMS c/o chest pain. Per EMS pt woke up about 1AM with mid sternal chest pain without radiation. EMS administered 1 nitro with some relief, and stated BP was 212/70. Pt alert and oriented in no acute distress at this time.

## 2015-11-09 NOTE — Care Management (Signed)
Placed in observation for chest pain.  Troponins so far are negative and cardiology consult is pending

## 2015-11-09 NOTE — H&P (Addendum)
[facility] [place] NAME: [name] [name]    MR#:  [id number]  DATE OF BIRTH:  06/12/1930  DATE OF ADMISSION:  11/09/2015  PRIMARY CARE PHYSICIAN: SPARKS,JEFFREY D, MD   REQUESTING/REFERRING PHYSICIAN: Dr Mariea Clonts  CHIEF COMPLAINT:  Chest pressure at 3 AM today  HISTORY OF PRESENT ILLNESS:  [name] [name]  is a 82 y.o. female with a known history of Hypertension, GERD, rheumatoid arthritis, hyperlipidemia DVT in May 2007 comes in the emergency room after she woken up at 3 AM with chest pressure. Patient symptoms lasted a few minutes. She called EMS when she woke up again at 6:00 feeling uneasy and some lingering chest pressure. Came to the emergency room for several troponin negative EKG does not show acute changes she is being admitted for chest pain rule out. Patient was noted to have systolic blood pressure at 170 to 190s in the ER. She is chest pain-free at present.  PAST MEDICAL HISTORY:   Past Medical History:  Diagnosis Date  . Cancer ([place])   . Hypertension     PAST SURGICAL HISTOIRY:   Past Surgical History:  Procedure Laterality Date  . ABDOMINAL HYSTERECTOMY    . CHOLECYSTECTOMY    . MASTECTOMY    . VASCULAR SURGERY      SOCIAL HISTORY:   Social History  Substance Use Topics  . Smoking status: Never Smoker  . Smokeless tobacco: Never Used  . Alcohol use No    FAMILY HISTORY:   Family History  Problem Relation Age of Onset  . Heart failure Mother   . Cancer Father   . Heart attack Sister     DRUG ALLERGIES:   Allergies  Allergen Reactions  . Sulfa Antibiotics     Pt doesn't remember  . Penicillins Itching    REVIEW OF SYSTEMS:  Review of Systems  Constitutional: Negative for chills, fever and weight loss.  HENT: Negative for ear discharge, ear pain and nosebleeds.   Eyes: Negative for blurred vision, pain and discharge.  Respiratory: Negative for sputum production, shortness of breath, wheezing  and stridor.   Cardiovascular: Positive for chest pain. Negative for palpitations, orthopnea and PND.  Gastrointestinal: Negative for abdominal pain, diarrhea, nausea and vomiting.  Genitourinary: Negative for frequency and urgency.  Musculoskeletal: Negative for back pain and joint pain.  Neurological: Positive for weakness. Negative for sensory change, speech change and focal weakness.  Psychiatric/Behavioral: Negative for depression and hallucinations. The patient is not nervous/anxious.      MEDICATIONS AT HOME:   Prior to Admission medications   Medication Sig Start Date End Date Taking? Authorizing Provider  acetaminophen (TYLENOL) 650 MG CR tablet Take 1,300 mg by mouth 2 (two) times daily.   Yes Historical Provider, MD  amLODipine (NORVASC) 10 MG tablet Take 1 tablet by mouth daily. 10/29/15  Yes Historical Provider, MD  Ascorbic Acid (VITAMIN C) 1000 MG tablet Take 1,000 mg by mouth daily.   Yes Historical Provider, MD  aspirin EC 81 MG tablet Take 81 mg by mouth daily.   Yes Historical Provider, MD  folic acid (FOLVITE) 1 MG tablet Take 1 mg by mouth daily.   Yes Historical Provider, MD  isosorbide mononitrate (IMDUR) 30 MG 24 hr tablet Take 30 mg by mouth daily.   Yes Historical Provider, MD  losartan (COZAAR) 100 MG tablet Take 100 mg by mouth daily.   Yes Historical Provider, MD  methotrexate (RHEUMATREX) 2.5 MG tablet Take 6 tablets  by mouth once a week. 10/28/15  Yes Historical Provider, MD  metoprolol (LOPRESSOR) 100 MG tablet Take 100 mg by mouth 2 (two) times daily.   Yes Historical Provider, MD  sertraline (ZOLOFT) 100 MG tablet Take 100 mg by mouth daily.   Yes Historical Provider, MD      VITAL SIGNS:  Blood pressure (!) 168/54, pulse (!) 49, temperature 98.2 F (36.8 C), temperature source Oral, resp. rate 14, height 5' 3.5" (1.613 m), weight 72.6 kg (160 lb), SpO2 95 %.  PHYSICAL EXAMINATION:  GENERAL:  82 y.o.-year-old patient lying in the bed with no acute  distress.  EYES: Pupils equal, round, reactive to light and accommodation. No scleral icterus. Extraocular muscles intact.  HEENT: Head atraumatic, normocephalic. Oropharynx and nasopharynx clear.  NECK:  Supple, no jugular venous distention. No thyroid enlargement, no tenderness.  LUNGS: Normal breath sounds bilaterally, no wheezing, rales,rhonchi or crepitation. No use of accessory muscles of respiration.  CARDIOVASCULAR: S1, S2 normal. No murmurs, rubs, or gallops.  ABDOMEN: Soft, nontender, nondistended. Bowel sounds present. No organomegaly or mass.  EXTREMITIES: No pedal edema, cyanosis, or clubbing.  NEUROLOGIC: Cranial nerves II through XII are intact. Muscle strength 5/5 in all extremities. Sensation intact. Gait not checked.  PSYCHIATRIC: The patient is alert and oriented x 3.  SKIN: No obvious rash, lesion, or ulcer.   LABORATORY PANEL:   CBC  Recent Labs Lab 11/09/15 1005  WBC 13.6*  HGB 16.6*  HCT 47.1*  PLT 196   ------------------------------------------------------------------------------------------------------------------  Chemistries   Recent Labs Lab 11/09/15 1005  NA 138  K 3.9  CL 103  CO2 26  GLUCOSE 114*  BUN 13  CREATININE 0.66  CALCIUM 9.3   ------------------------------------------------------------------------------------------------------------------  Cardiac Enzymes  Recent Labs Lab 11/09/15 1005  TROPONINI <0.03   ------------------------------------------------------------------------------------------------------------------  RADIOLOGY:  Dg Chest 2 View  Result Date: 11/09/2015 CLINICAL DATA:  Midsternal chest pain beginning around 1 a.m. today associated with shortness of breath. History of hypertension and unspecified malignancy. EXAM: CHEST  2 VIEW COMPARISON:  Chest x-ray of October 01, 2013 FINDINGS: The lungs are adequately inflated. There is no focal infiltrate. The cardiac silhouette is enlarged. The central pulmonary  vascularity is prominent without cephalization. There is calcification in the wall of the thoracic aorta. There is no pleural effusion. The bony structures exhibit no acute abnormalities. IMPRESSION: Mild chronic bronchitic changes without significant hyperinflation nor evidence of pneumonia. Mild cardiomegaly without pulmonary vascular congestion. Aortic atherosclerosis. Electronically Signed   By: David  Martinique M.D.   On: 11/09/2015 10:30    EKG:   NSR, no ST elevation or depression IMPRESSION AND PLAN:   [name] [name]  is a 82 y.o. female with a known history of Hypertension, GERD, rheumatoid arthritis, hyperlipidemia DVT in May 2007 comes in the emergency room after she woken up at 3 AM with chest pressure. Patient symptoms lasted a few minutes. She called EMS when she woke up again at 6:00 feeling uneasy and some lingering chest pressure. Came to the emergency room for several troponin negative EKG does not show acute changes she is being admitted for chest pain rule out.  1. Chest pain  -Risk factor hypertension  and hyperlipidemia -Admit to telemetry floor -Sublingual nitroglycerin aspirin when necessary morphine if needed continue statins -Cardiology consultation with Dr. Clayborn Bigness -Cycle cardiac enzymes 3 -Myoview stress test in the morning if patient remains asymptomatic and troponins are negative  2. malignanr Hypertension continue amlodipine, imdur -prn hydralazine  3. Hyperlipidemia on statins  4. GERD continue PPI  5. Rheumatoid arthritis on methotrexate and folic acid  6. DVT prophylaxis subcutaneous Lovenox All the records are reviewed and case discussed with ED provider. Management plans discussed with the patient, family and they are in agreement.  CODE STATUS: Full TOTAL TIME TAKING CARE OF THIS PATIENT: 45 minutes.    Haeleigh Streiff M.D on 11/09/2015 at 12:23 PM  Between 7am to 6pm - Pager - [phone]  After 6pm go to [email or web address] - password [email or web address]  [facility]  Office  [phone]  CC: Primary care physician; Idelle Crouch, MD

## 2015-11-09 NOTE — Progress Notes (Addendum)
Patient admitted to unit. Oriented to room, call bell, and staff. Bed in lowest position. Fall safety plan reviewed. Full assessment to Epic. Skin assessment verified with Maddie Himes RN. Telemetry box verification with tele clerk and Jamie NT- Box#: [id number]. Will continue to monitor.  Stress test scheduled for tomorrow - per Nuclear Medicine, RN needs to place order for 2 more troponins to be drawn.    *Wallet, cash, cards, keys locked in security - key and form on paper chart. Send at discharge*

## 2015-11-09 NOTE — ED Provider Notes (Signed)
[facility] Emergency Department Provider Note  ____________________________________________  Time seen: Approximately 10:43 AM  I have reviewed the triage vital signs and the nursing notes.   HISTORY  Chief Complaint Chest Pain    HPI [name] [name] is a 82 y.o. female with a history of HTN, remote breast see a, presenting with chest pain. The patient reports that she awoke at 1 AM with a midsternal chest pain that did not radiate, was not associated with shortness of breath, diaphoresis, nausea or vomiting, palpitations, lightheadedness or syncope. It lasted for approximately 2 hours, she went to sleep, and when she awoke at 6:15 the pain had come back. She did take all of her morning medications including her blood pressure medications. On EMS arrival, the patient was given nitroglycerin and her pain completely resolved. She is a symptomatically at this time.  The patient has never had a cardiac catheterization and her last stress test was years ago.  Past Medical History:  Diagnosis Date  . Cancer ([place])   . Hypertension     There are no active problems to display for this patient.   Past Surgical History:  Procedure Laterality Date  . ABDOMINAL HYSTERECTOMY    . CHOLECYSTECTOMY    . MASTECTOMY    . VASCULAR SURGERY        Allergies Sulfa antibiotics and Penicillins  Family History  Problem Relation Age of Onset  . Heart failure Mother   . Cancer Father   . Heart attack Sister     Social History Social History  Substance Use Topics  . Smoking status: Never Smoker  . Smokeless tobacco: Never Used  . Alcohol use No    Review of Systems Constitutional: No fever/chills.No lightheadedness or syncope. Eyes: No visual changes. No blurred or double vision ENT: No sore throat. No congestion or rhinorrhea. Cardiovascular: Denies chest pain. Denies palpitations. Respiratory: Denies shortness of breath.  No cough. Gastrointestinal: No  abdominal pain.  No nausea, no vomiting.  No diarrhea.  No constipation. Genitourinary: Negative for dysuria. Musculoskeletal: Negative for back pain. Skin: Negative for rash. Neurological: Negative for headaches. No focal numbness, tingling or weakness.   10-point ROS otherwise negative.  ____________________________________________   PHYSICAL EXAM:  VITAL SIGNS: ED Triage Vitals  Enc Vitals Group     BP 11/09/15 0944 (!) 190/61     Pulse Rate 11/09/15 0944 62     Resp 11/09/15 0944 16     Temp 11/09/15 0944 98.2 F (36.8 C)     Temp Source 11/09/15 0944 Oral     SpO2 11/09/15 0944 96 %     Weight 11/09/15 0945 160 lb (72.6 kg)     Height 11/09/15 0945 5' 3.5" (1.613 m)     Head Circumference --      Peak Flow --      Pain Score --      Pain Loc --      Pain Edu? --      Excl. in [place]? --     Constitutional: Alert and oriented. Well appearing and in no acute distress. Answers questions appropriately. Eyes: Conjunctivae are normal.  EOMI. No scleral icterus. Head: Atraumatic. Nose: No congestion/rhinnorhea. Mouth/Throat: Mucous membranes are moist.  Neck: No stridor.  Supple.  No JVD. Cardiovascular: Normal rate, regular rhythm. No murmurs, rubs or gallops.  Respiratory: Normal respiratory effort.  No accessory muscle use or retractions. Lungs CTAB.  No wheezes, rales or ronchi. Gastrointestinal: Soft, nontender and nondistended.  No guarding or rebound.  No peritoneal signs. Musculoskeletal: No LE edema. No ttp in the calves or palpable cords.  Negative Homan's sign. Neurologic:  A&Ox3.  Speech is clear.  Face and smile are symmetric.  EOMI.  Moves all extremities well. Skin:  Skin is warm, dry and intact. No rash noted. Psychiatric: Mood and affect are normal. Speech and behavior are normal.  Normal judgement.  ____________________________________________   LABS (all labs ordered are listed, but only abnormal results are displayed)  Labs Reviewed  BASIC METABOLIC  PANEL - Abnormal; Notable for the following:       Result Value   Glucose, Bld 114 (*)    All other components within normal limits  CBC - Abnormal; Notable for the following:    WBC 13.6 (*)    Hemoglobin 16.6 (*)    HCT 47.1 (*)    MCV 116.3 (*)    MCH 40.9 (*)    RDW 15.2 (*)    All other components within normal limits  TROPONIN I   ____________________________________________  EKG  ED ECG REPORT I, Eula Listen, the attending physician, personally viewed and interpreted this ECG.   Date: 11/09/2015  EKG Time: 943  Rate: 61  Rhythm: normal sinus rhythm  Axis: leftward  Intervals:none  ST&T Change: Nonspecific T-wave inversion in V1. No ST elevation.  ____________________________________________  RADIOLOGY  Dg Chest 2 View  Result Date: 11/09/2015 CLINICAL DATA:  Midsternal chest pain beginning around 1 a.m. today associated with shortness of breath. History of hypertension and unspecified malignancy. EXAM: CHEST  2 VIEW COMPARISON:  Chest x-ray of October 01, 2013 FINDINGS: The lungs are adequately inflated. There is no focal infiltrate. The cardiac silhouette is enlarged. The central pulmonary vascularity is prominent without cephalization. There is calcification in the wall of the thoracic aorta. There is no pleural effusion. The bony structures exhibit no acute abnormalities. IMPRESSION: Mild chronic bronchitic changes without significant hyperinflation nor evidence of pneumonia. Mild cardiomegaly without pulmonary vascular congestion. Aortic atherosclerosis. Electronically Signed   By: David  Martinique M.D.   On: 11/09/2015 10:30    ____________________________________________   PROCEDURES  Procedure(s) performed: None  Procedures  Critical Care performed: No ____________________________________________   INITIAL IMPRESSION / ASSESSMENT AND PLAN / ED COURSE  Pertinent labs & imaging results that were available during my care of the patient were  reviewed by me and considered in my medical decision making (see chart for details).  82 y.o. female with history of hypertension presenting with chest pain in the setting of hypertension. At this time, the patient's pain has completely resolved. She does have her reassuring cardiopulmonary examination, but has not had any recent risk stratification studies. No plan to treat the patient's hypertension, and prevent ongoing chest pain, with nitroglycerin paste and she will also receive aspirin in the emergency department. Today, do not see any evidence that would be suggestive of PE, aortic pathology, or infection.  ----------------------------------------- 11:25 AM on 11/09/2015 -----------------------------------------  The patient remains hemodynamically stable. Her labs are reassuring with a negative troponin. We'll plan admission to the hospital at this time. ____________________________________________  FINAL CLINICAL IMPRESSION(S) / ED DIAGNOSES  Final diagnoses:  Chest pain, unspecified chest pain type    Clinical Course      NEW MEDICATIONS STARTED DURING THIS VISIT:  New Prescriptions   No medications on file      Eula Listen, MD 11/09/15 1125

## 2015-11-09 NOTE — ED Notes (Signed)
Patient transported to X-ray 

## 2015-11-10 ENCOUNTER — Encounter: Payer: Self-pay | Admitting: Radiology

## 2015-11-10 ENCOUNTER — Encounter: Payer: PPO | Attending: Internal Medicine

## 2015-11-10 DIAGNOSIS — I10 Essential (primary) hypertension: Secondary | ICD-10-CM | POA: Diagnosis not present

## 2015-11-10 DIAGNOSIS — R0789 Other chest pain: Secondary | ICD-10-CM | POA: Insufficient documentation

## 2015-11-10 DIAGNOSIS — R079 Chest pain, unspecified: Secondary | ICD-10-CM | POA: Insufficient documentation

## 2015-11-10 DIAGNOSIS — M069 Rheumatoid arthritis, unspecified: Secondary | ICD-10-CM | POA: Diagnosis not present

## 2015-11-10 DIAGNOSIS — E119 Type 2 diabetes mellitus without complications: Secondary | ICD-10-CM | POA: Diagnosis not present

## 2015-11-10 LAB — NM MYOCAR MULTI W/SPECT W/WALL MOTION / EF
CHL CUP MPHR: 1317 {beats}/min
CHL CUP NUCLEAR SDS: 0
CSEPED: 1 min
CSEPEDS: 1 s
CSEPHR: 62 %
Estimated workload: 1 METS
LV sys vol: 21 mL
LVDIAVOL: 66 mL (ref 46–106)
Peak HR: 85 {beats}/min
Rest HR: 64 {beats}/min
SRS: 15
SSS: 13
TID: 1.08

## 2015-11-10 MED ORDER — TECHNETIUM TC 99M TETROFOSMIN IV KIT
31.0200 | PACK | Freq: Once | INTRAVENOUS | Status: AC | PRN
  Administered 2015-11-10: 31.02 via INTRAVENOUS

## 2015-11-10 MED ORDER — ISOSORBIDE MONONITRATE ER 60 MG PO TB24: 60.0000 mg | ORAL_TABLET | Freq: Every day | ORAL | Status: DC

## 2015-11-10 MED ORDER — REGADENOSON 0.4 MG/5ML IV SOLN
0.4000 mg | Freq: Once | INTRAVENOUS | Status: AC
  Administered 2015-11-10: 0.4 mg via INTRAVENOUS

## 2015-11-10 MED ORDER — TECHNETIUM TC 99M TETROFOSMIN IV KIT
13.0000 | PACK | Freq: Once | INTRAVENOUS | Status: AC | PRN
  Administered 2015-11-10: 12.09 via INTRAVENOUS

## 2015-11-10 MED ORDER — ISOSORBIDE MONONITRATE ER 60 MG PO TB24
60.0000 mg | ORAL_TABLET | Freq: Every day | ORAL | 0 refills | Status: DC
Start: 2015-11-11 — End: 2017-04-05

## 2015-11-10 NOTE — Progress Notes (Signed)
Patient is  being discharged home, discharged home, discharge instruction provided, iv removed tele removed, patient discharged home

## 2015-11-10 NOTE — Discharge Instructions (Signed)
Electrocardiography Electrocardiography is a test that records the electrical impulses of the heart. It assesses many aspects of heart health, including:  Heart function.  Heart rhythm.  Heart muscle thickness. Electrocardiography can be done as a routine part of a physical exam. It can also be done to evaluate symptoms such as severe chest pain and heart palpitations. PROCEDURE   Electrocardiography is simple, safe, and painless. No electricity goes through your body during the procedure.  You will be asked to remove your clothes from the waist up and lie on your back for the test.  Sticky patches (electrodes) will be placed on your chest, arms, and legs. The electrodes will be attached by wires to the electrocardiography machine.  You will be asked to relax and lie still for a few seconds while the electrocardiography machine records the electrical activity of your heart. AFTER THE PROCEDURE  If electrocardiography is part of a routine physical exam, you may return to normal activities as told by your health care provider.  Your health care provider or a heart doctor (cardiologist) will interpret the recording.  The test result may not be available during your visit. If your test result is not back during the visit, make an appointment with your health care provider to find out the result. It is your responsibility to get your test results.   This information is not intended to replace advice given to you by your health care provider. Make sure you discuss any questions you have with your health care provider.   Document Released: 02/11/2000 Document Revised: 03/06/2014 Document Reviewed: 06/26/2011 Elsevier Interactive Patient Education 2016 Elsevier Inc.  

## 2015-11-10 NOTE — Discharge Summary (Signed)
[place] [place] NAME: [name] [name]    MR#:  [id number]  DATE OF BIRTH:  1930-06-28  DATE OF ADMISSION:  11/09/2015 ADMITTING PHYSICIAN: Fritzi Mandes, MD  DATE OF DISCHARGE: 11/10/2015  PRIMARY CARE PHYSICIAN: SPARKS,JEFFREY D, MD    ADMISSION DIAGNOSIS:  Essential hypertension [I10] Chest pain, unspecified chest pain type [R07.9]  DISCHARGE DIAGNOSIS:  Active Problems:   Chest pain   SECONDARY DIAGNOSIS:   Past Medical History:  Diagnosis Date  . Cancer ([place])   . Hypertension     HOSPITAL COURSE:   82 year old female with rheumatoid arthritis  who presented with atypical chest pain.  1. Chest pain, atypical: Patient was admitted to telemetry service. Troponins 2 are negative. EKG showed no acute changes. Patient had consultation with cardiology. She underwent cardiac stress test which showed no evidence of cardiac ischemia and EF of 60%.   2. Malignant hypertension: Patient will continue on Norvasc, losartan and metoprolol and due to elevation in blood pressure isosorbide was increased to 60 mg daily. She needs close follow-up with her primary care physician. Goal systolic blood pressure less than 150.  3. Depression: Continue Zoloft.  4. Rheumatoid arthritis: Patient will continue methotrexate and folic acid.   DISCHARGE CONDITIONS AND DIET:  Stable for discharge on heart healthy diet  CONSULTS OBTAINED:    DRUG ALLERGIES:   Allergies  Allergen Reactions  . Sulfa Antibiotics     Pt doesn't remember  . Penicillins Itching    DISCHARGE MEDICATIONS:   Current Discharge Medication List    CONTINUE these medications which have CHANGED   Details  isosorbide mononitrate (IMDUR) 60 MG 24 hr tablet Take 1 tablet (60 mg total) by mouth daily. Qty: 30 tablet, Refills: 0      CONTINUE these medications which have NOT CHANGED   Details  acetaminophen (TYLENOL) 650 MG CR tablet Take 1,300 mg by mouth 2 (two)  times daily.    amLODipine (NORVASC) 10 MG tablet Take 1 tablet by mouth daily.    Ascorbic Acid (VITAMIN C) 1000 MG tablet Take 1,000 mg by mouth daily.    aspirin EC 81 MG tablet Take 81 mg by mouth daily.    folic acid (FOLVITE) 1 MG tablet Take 1 mg by mouth daily.    losartan (COZAAR) 100 MG tablet Take 100 mg by mouth daily.    methotrexate (RHEUMATREX) 2.5 MG tablet Take 6 tablets by mouth once a week.    metoprolol (LOPRESSOR) 100 MG tablet Take 100 mg by mouth 2 (two) times daily.    sertraline (ZOLOFT) 100 MG tablet Take 100 mg by mouth daily.              Today   CHIEF COMPLAINT:   Patient seen this morning. She voices no complains. She voices no chest pain or shortness of breath.   VITAL SIGNS:  Blood pressure (!) 174/65, pulse 63, temperature 98.4 F (36.9 C), temperature source Oral, resp. rate 18, height 5\' 3"  (1.6 m), weight 66.4 kg (146 lb 6.4 oz), SpO2 96 %.   REVIEW OF SYSTEMS:  Review of Systems  Constitutional: Negative.  Negative for chills, fever and malaise/fatigue.  HENT: Negative.  Negative for ear discharge, ear pain, hearing loss, nosebleeds and sore throat.   Eyes: Negative.  Negative for blurred vision and pain.  Respiratory: Negative.  Negative for cough, hemoptysis, shortness of breath and wheezing.   Cardiovascular: Negative.  Negative for chest pain, palpitations and  leg swelling.  Gastrointestinal: Negative.  Negative for abdominal pain, blood in stool, diarrhea, nausea and vomiting.  Genitourinary: Negative.  Negative for dysuria.  Musculoskeletal: Negative for back pain.       Arthritis  Skin: Negative.   Neurological: Negative for dizziness, tremors, speech change, focal weakness, seizures and headaches.  Endo/Heme/Allergies: Negative.  Does not bruise/bleed easily.  Psychiatric/Behavioral: Negative.  Negative for depression, hallucinations and suicidal ideas.     PHYSICAL EXAMINATION:  GENERAL:  82 y.o.-year-old patient  lying in the bed with no acute distress.  NECK:  Supple, no jugular venous distention. No thyroid enlargement, no tenderness.  LUNGS: Normal breath sounds bilaterally, no wheezing, rales,rhonchi  No use of accessory muscles of respiration.  CARDIOVASCULAR: S1, S2 normal. No murmurs, rubs, or gallops.  ABDOMEN: Soft, non-tender, non-distended. Bowel sounds present. No organomegaly or mass.  EXTREMITIES: No pedal edema, cyanosis, or clubbing.  PSYCHIATRIC: The patient is alert and oriented x 3.  SKIN: No Synovitis mild hypertrophic changes distally.   DATA REVIEW:   CBC  Recent Labs Lab 11/09/15 1005  WBC 13.6*  HGB 16.6*  HCT 47.1*  PLT 196    Chemistries   Recent Labs Lab 11/09/15 1005  NA 138  K 3.9  CL 103  CO2 26  GLUCOSE 114*  BUN 13  CREATININE 0.66  CALCIUM 9.3    Cardiac Enzymes  Recent Labs Lab 11/09/15 1005 11/09/15 1416 11/09/15 2009  TROPONINI <0.03 <0.03 <0.03    Microbiology Results  @[email or web address] @  RADIOLOGY:  Dg Chest 2 View  Result Date: 11/09/2015 CLINICAL DATA:  Midsternal chest pain beginning around 1 a.m. today associated with shortness of breath. History of hypertension and unspecified malignancy. EXAM: CHEST  2 VIEW COMPARISON:  Chest x-ray of October 01, 2013 FINDINGS: The lungs are adequately inflated. There is no focal infiltrate. The cardiac silhouette is enlarged. The central pulmonary vascularity is prominent without cephalization. There is calcification in the wall of the thoracic aorta. There is no pleural effusion. The bony structures exhibit no acute abnormalities. IMPRESSION: Mild chronic bronchitic changes without significant hyperinflation nor evidence of pneumonia. Mild cardiomegaly without pulmonary vascular congestion. Aortic atherosclerosis. Electronically Signed   By: David  Martinique M.D.   On: 11/09/2015 10:30      Management plans discussed with the patient and she is in agreement. Stable for discharge   Patient should  follow up with pcp  CODE STATUS:     Code Status Orders        Start     Ordered   11/09/15 1304  Full code  Continuous     11/09/15 1304    Code Status History    Date Active Date Inactive Code Status Order ID Comments User Context   This patient has a current code status but no historical code status.      TOTAL TIME TAKING CARE OF THIS PATIENT: 36 minutes.    Note: This dictation was prepared with Dragon dictation along with smaller phrase technology. Any transcriptional errors that result from this process are unintentional.  Lashun Mccants M.D on 11/10/2015 at 11:17 AM  Between 7am to 6pm - Pager - [phone] After 6pm go to [email or web address] - password [facility]  Office  [phone]  CC: Primary care physician; Idelle Crouch, MD

## 2015-11-10 NOTE — Progress Notes (Signed)
Patient belonging that was in the safe all returned to patient at this time.

## 2015-11-11 NOTE — Consult Note (Signed)
Reason for Consult: Chest pain angina Referring Physician: Dr. Fritzi Mandes hospitalist, Dr. Georgie Chard primary  Starlite Baden Mohr is an 82 y.o. female.  HPI: Patient's 82 year old female with history of hypertension GERD rheumatoid arthritis hyperlipidemia DVT came to emergency room after she woke up at 3 AM with chest pressure. Patient denies previous symptoms as chest pain lasted for a few minutes she called EMS at around 6 AM, she still felt uneasy with a lingering pressure came to emergency room with negative troponin and EKG chest pain that improved her blood pressures elevated. Patient was then admitted to telemetry with serial troponins and EKGs. Patient scheduled for Myoview in the morning for further evaluation. Denies previous history of cardiac problems  Past Medical History:  Diagnosis Date  . Cancer ([place])   . Hypertension     Past Surgical History:  Procedure Laterality Date  . ABDOMINAL HYSTERECTOMY    . CHOLECYSTECTOMY    . MASTECTOMY    . VASCULAR SURGERY      Family History  Problem Relation Age of Onset  . Heart failure Mother   . Cancer Father   . Heart attack Sister     Social History:  reports that she has never smoked. She has never used smokeless tobacco. She reports that she does not drink alcohol or use drugs.  Allergies:  Allergies  Allergen Reactions  . Sulfa Antibiotics     Pt doesn't remember  . Penicillins Itching    Medications: I have reviewed the patient's current medications.  Results for orders placed or performed during the hospital encounter of 11/09/15 (from the past 48 hour(s))  Troponin I     Status: None   Collection Time: 11/09/15  2:16 PM  Result Value Ref Range   Troponin I <0.03 <0.03 ng/mL  Troponin I     Status: None   Collection Time: 11/09/15  8:09 PM  Result Value Ref Range   Troponin I <0.03 <0.03 ng/mL    Nm Myocar Multi W/spect W/wall Motion / Ef  Result Date: 11/10/2015  There was no ST segment deviation noted  during stress.  No T wave inversion was noted during stress.  Normal LVF EF=68%  The study is normal.  This is a low risk study.  The left ventricular ejection fraction is normal (55-65%).  Nuclear stress EF: 68%.     Review of Systems  Constitutional: Positive for malaise/fatigue.  HENT: Negative.   Eyes: Negative.   Respiratory: Positive for shortness of breath.   Cardiovascular: Positive for chest pain and palpitations.  Gastrointestinal: Negative.   Genitourinary: Negative.   Musculoskeletal: Negative.   Skin: Negative.   Neurological: Negative.   Endo/Heme/Allergies: Negative.   Psychiatric/Behavioral: Negative.    Blood pressure (!) 177/49, pulse 66, temperature 98.1 F (36.7 C), temperature source Oral, resp. rate 18, height 5\' 3"  (1.6 m), weight 66.4 kg (146 lb 6.4 oz), SpO2 97 %. Physical Exam  Nursing note and vitals reviewed. Constitutional: She is oriented to person, place, and time. She appears well-developed and well-nourished.  HENT:  Head: Normocephalic and atraumatic.  Eyes: Conjunctivae and EOM are normal. Pupils are equal, round, and reactive to light.  Neck: Normal range of motion. Neck supple.  Cardiovascular: Normal rate and regular rhythm.   Murmur heard. Respiratory: Effort normal and breath sounds normal.  GI: Soft. Bowel sounds are normal.  Musculoskeletal: Normal range of motion.  Neurological: She is alert and oriented to person, place, and time. She has normal reflexes.  Skin: Skin is warm and dry.  Psychiatric: She has a normal mood and affect.    Assessment/Plan: Angina Chest pain GERD Hyperlipidemia DVT Hypertension Rheumatoid Arthritis . PLAN Agree with admission for rule out MI Agree with telemetry EKGs and troponins Continue hypertension control with amlodipine and Imdur hydralazine Continue methotrexate and folic acid for rheumatoid arthritis Continue statin therapy for hyperlipidemia Agree with proton pump inhibitors for  GERD Recommend functional study for evaluation of ischemia Consider echocardiogram for left ventricular function and murmur  Owin Vignola D. 11/11/2015, 12:57 PM

## 2015-11-25 DIAGNOSIS — F5104 Psychophysiologic insomnia: Secondary | ICD-10-CM | POA: Diagnosis not present

## 2015-11-25 DIAGNOSIS — M0600 Rheumatoid arthritis without rheumatoid factor, unspecified site: Secondary | ICD-10-CM | POA: Diagnosis not present

## 2015-11-25 DIAGNOSIS — E7800 Pure hypercholesterolemia, unspecified: Secondary | ICD-10-CM | POA: Diagnosis not present

## 2015-11-25 DIAGNOSIS — M4806 Spinal stenosis, lumbar region: Secondary | ICD-10-CM | POA: Diagnosis not present

## 2015-11-25 DIAGNOSIS — I10 Essential (primary) hypertension: Secondary | ICD-10-CM | POA: Diagnosis not present

## 2015-12-09 DIAGNOSIS — Z79899 Other long term (current) drug therapy: Secondary | ICD-10-CM | POA: Diagnosis not present

## 2015-12-09 DIAGNOSIS — G629 Polyneuropathy, unspecified: Secondary | ICD-10-CM | POA: Diagnosis not present

## 2015-12-09 DIAGNOSIS — M0600 Rheumatoid arthritis without rheumatoid factor, unspecified site: Secondary | ICD-10-CM | POA: Diagnosis not present

## 2016-01-03 DIAGNOSIS — E7800 Pure hypercholesterolemia, unspecified: Secondary | ICD-10-CM | POA: Diagnosis not present

## 2016-01-03 DIAGNOSIS — I10 Essential (primary) hypertension: Secondary | ICD-10-CM | POA: Diagnosis not present

## 2016-01-03 DIAGNOSIS — Z1329 Encounter for screening for other suspected endocrine disorder: Secondary | ICD-10-CM | POA: Diagnosis not present

## 2016-01-03 DIAGNOSIS — Z79899 Other long term (current) drug therapy: Secondary | ICD-10-CM | POA: Diagnosis not present

## 2016-01-14 DIAGNOSIS — H43813 Vitreous degeneration, bilateral: Secondary | ICD-10-CM | POA: Diagnosis not present

## 2016-04-11 DIAGNOSIS — M0600 Rheumatoid arthritis without rheumatoid factor, unspecified site: Secondary | ICD-10-CM | POA: Diagnosis not present

## 2016-04-11 DIAGNOSIS — R011 Cardiac murmur, unspecified: Secondary | ICD-10-CM | POA: Diagnosis not present

## 2016-04-11 DIAGNOSIS — Z79899 Other long term (current) drug therapy: Secondary | ICD-10-CM | POA: Diagnosis not present

## 2016-04-11 DIAGNOSIS — E7800 Pure hypercholesterolemia, unspecified: Secondary | ICD-10-CM | POA: Diagnosis not present

## 2016-04-11 DIAGNOSIS — I10 Essential (primary) hypertension: Secondary | ICD-10-CM | POA: Diagnosis not present

## 2016-06-01 DIAGNOSIS — Z79899 Other long term (current) drug therapy: Secondary | ICD-10-CM | POA: Diagnosis not present

## 2016-06-01 DIAGNOSIS — M0600 Rheumatoid arthritis without rheumatoid factor, unspecified site: Secondary | ICD-10-CM | POA: Diagnosis not present

## 2016-06-08 DIAGNOSIS — Z79899 Other long term (current) drug therapy: Secondary | ICD-10-CM | POA: Diagnosis not present

## 2016-06-08 DIAGNOSIS — M79641 Pain in right hand: Secondary | ICD-10-CM | POA: Diagnosis not present

## 2016-06-08 DIAGNOSIS — M0600 Rheumatoid arthritis without rheumatoid factor, unspecified site: Secondary | ICD-10-CM | POA: Diagnosis not present

## 2016-06-08 DIAGNOSIS — R011 Cardiac murmur, unspecified: Secondary | ICD-10-CM | POA: Diagnosis not present

## 2016-06-08 DIAGNOSIS — R252 Cramp and spasm: Secondary | ICD-10-CM | POA: Diagnosis not present

## 2016-06-19 DIAGNOSIS — I10 Essential (primary) hypertension: Secondary | ICD-10-CM | POA: Diagnosis not present

## 2016-06-19 DIAGNOSIS — E7800 Pure hypercholesterolemia, unspecified: Secondary | ICD-10-CM | POA: Diagnosis not present

## 2016-06-19 DIAGNOSIS — I272 Pulmonary hypertension, unspecified: Secondary | ICD-10-CM | POA: Diagnosis not present

## 2016-06-23 ENCOUNTER — Encounter: Payer: Self-pay | Admitting: Emergency Medicine

## 2016-06-23 ENCOUNTER — Emergency Department: Payer: PPO

## 2016-06-23 ENCOUNTER — Emergency Department
Admission: EM | Admit: 2016-06-23 | Discharge: 2016-06-23 | Disposition: A | Discharge status: Home / Self Care | Payer: PPO | Attending: Emergency Medicine | Admitting: Emergency Medicine

## 2016-06-23 DIAGNOSIS — R0602 Shortness of breath: Secondary | ICD-10-CM | POA: Insufficient documentation

## 2016-06-23 DIAGNOSIS — I10 Essential (primary) hypertension: Secondary | ICD-10-CM | POA: Diagnosis not present

## 2016-06-23 DIAGNOSIS — R1084 Generalized abdominal pain: Secondary | ICD-10-CM | POA: Diagnosis not present

## 2016-06-23 DIAGNOSIS — Z7982 Long term (current) use of aspirin: Secondary | ICD-10-CM | POA: Insufficient documentation

## 2016-06-23 DIAGNOSIS — Z853 Personal history of malignant neoplasm of breast: Secondary | ICD-10-CM | POA: Insufficient documentation

## 2016-06-23 DIAGNOSIS — R531 Weakness: Secondary | ICD-10-CM | POA: Diagnosis not present

## 2016-06-23 DIAGNOSIS — R109 Unspecified abdominal pain: Secondary | ICD-10-CM | POA: Diagnosis not present

## 2016-06-23 DIAGNOSIS — Z79899 Other long term (current) drug therapy: Secondary | ICD-10-CM | POA: Insufficient documentation

## 2016-06-23 LAB — BASIC METABOLIC PANEL
ANION GAP: 7 (ref 5–15)
BUN: 14 mg/dL (ref 6–20)
CHLORIDE: 105 mmol/L (ref 101–111)
CO2: 25 mmol/L (ref 22–32)
CREATININE: 0.6 mg/dL (ref 0.44–1.00)
Calcium: 8.4 mg/dL — ABNORMAL LOW (ref 8.9–10.3)
GFR calc non Af Amer: >60 mL/min (ref >60)
Glucose, Bld: 106 mg/dL — ABNORMAL HIGH (ref 65–99)
POTASSIUM: 3.5 mmol/L (ref 3.5–5.1)
SODIUM: 137 mmol/L (ref 135–145)

## 2016-06-23 LAB — HEPATIC FUNCTION PANEL
ALT: 14 U/L (ref 14–54)
AST: 25 U/L (ref 15–41)
Albumin: 3.3 g/dL — ABNORMAL LOW (ref 3.5–5.0)
Alkaline Phosphatase: 66 U/L (ref 38–126)
BILIRUBIN INDIRECT: 0.2 mg/dL — AB (ref 0.3–0.9)
Bilirubin, Direct: 0.1 mg/dL (ref 0.1–0.5)
TOTAL PROTEIN: 6.4 g/dL — AB (ref 6.5–8.1)
Total Bilirubin: 0.3 mg/dL (ref 0.3–1.2)

## 2016-06-23 LAB — FIBRIN DERIVATIVES D-DIMER (ARMC ONLY): Fibrin derivatives D-dimer (ARMC): 1376.71 — ABNORMAL HIGH (ref 0.00–499.00)

## 2016-06-23 LAB — LIPASE, BLOOD: Lipase: 20 U/L (ref 11–51)

## 2016-06-23 LAB — CBC
HCT: 40.4 % (ref 35.0–47.0)
HEMOGLOBIN: 13.4 g/dL (ref 12.0–16.0)
MCH: 38.6 pg — ABNORMAL HIGH (ref 26.0–34.0)
MCHC: 33.1 g/dL (ref 32.0–36.0)
MCV: 116.7 fL — ABNORMAL HIGH (ref 80.0–100.0)
Platelets: 298 10*3/uL (ref 150–440)
RBC: 3.46 MIL/uL — AB (ref 3.80–5.20)
RDW: 14.3 % (ref 11.5–14.5)
WBC: 11.2 10*3/uL — ABNORMAL HIGH (ref 3.6–11.0)

## 2016-06-23 LAB — TROPONIN I: Troponin I: <0.03 ng/mL (ref <0.03)

## 2016-06-23 LAB — TSH: TSH: 1.412 u[IU]/mL (ref 0.350–4.500)

## 2016-06-23 LAB — BRAIN NATRIURETIC PEPTIDE: B NATRIURETIC PEPTIDE 5: 276 pg/mL — AB (ref 0.0–100.0)

## 2016-06-23 MED ORDER — IOPAMIDOL (ISOVUE-370) INJECTION 76%
100.0000 mL | Freq: Once | INTRAVENOUS | Status: AC | PRN
  Administered 2016-06-23: 100 mL via INTRAVENOUS

## 2016-06-23 MED ORDER — IOPAMIDOL (ISOVUE-300) INJECTION 61%
30.0000 mL | Freq: Once | INTRAVENOUS | Status: AC | PRN
  Administered 2016-06-23: 30 mL via ORAL

## 2016-06-23 NOTE — ED Notes (Signed)
Pt in CT at this time.

## 2016-06-23 NOTE — ED Triage Notes (Signed)
Pt here from home via [facility] with c/o [facility] today, EMS reports RA sat 91%.

## 2016-06-23 NOTE — ED Notes (Signed)
Pt returned to tx room at this time

## 2016-06-23 NOTE — ED Notes (Signed)
MD stafford at bedside at this time.  

## 2016-06-23 NOTE — ED Notes (Signed)
Pt. Verbalizes understanding of d/c instructions and follow-up. VS stable and pain controlled per pt.  Pt. In NAD at time of d/c and denies further concerns regarding this visit. Pt. Stable at the time of departure from the unit, departing unit by the safest and most appropriate manner per that pt condition and limitations. Pt advised to return to the ED at any time for emergent concerns, or for new/worsening symptoms.   

## 2016-06-23 NOTE — ED Provider Notes (Signed)
[facility] Emergency Department Provider Note  ____________________________________________  Time seen: Approximately 5:57 PM  I have reviewed the triage vital signs and the nursing notes.   HISTORY  Chief Complaint Shortness of Breath    HPI [name] [name] is a 83 y.o. female who complains of gradual onset shortness of breath worsening over the past week. Also with generalized weakness that has been worsening as well. Denies chest pain abdominal pain or back pain. No cough fever or chills. Never had anything like this before. Denies any history of pulmonary disease. No orthopnea PND or peripheral edema. Worse with exertion. No alleviating factors. Not positional. Patient reports severe intensity.  She refers she's had a double mastectomy due to "leaking" breast implants and denies a history of breast cancer.  Review of electronic medical records shows a history of breast cancer as well as a history of DVT in 2007.   Past Medical History:  Diagnosis Date  . Cancer ([place])   . Hypertension   DVT Breast cancer Dementia   Patient Active Problem List   Diagnosis Date Noted  . Chest pain 11/09/2015     Past Surgical History:  Procedure Laterality Date  . ABDOMINAL HYSTERECTOMY    . CHOLECYSTECTOMY    . MASTECTOMY    . VASCULAR SURGERY       Prior to Admission medications   Medication Sig Start Date End Date Taking? Authorizing Provider  acetaminophen (TYLENOL) 650 MG CR tablet Take 1,300 mg by mouth 2 (two) times daily.   Yes Historical Provider, MD  amLODipine (NORVASC) 10 MG tablet Take 1 tablet by mouth daily. 10/29/15  Yes Historical Provider, MD  Ascorbic Acid (VITAMIN C) 1000 MG tablet Take 1,000 mg by mouth daily.   Yes Historical Provider, MD  aspirin EC 81 MG tablet Take 81 mg by mouth daily.   Yes Historical Provider, MD  folic acid (FOLVITE) 1 MG tablet Take 1 mg by mouth daily.   Yes Historical Provider, MD  isosorbide mononitrate  (IMDUR) 30 MG 24 hr tablet Take 30 mg by mouth daily. 06/20/16  Yes Historical Provider, MD  losartan (COZAAR) 100 MG tablet Take 100 mg by mouth daily.   Yes Historical Provider, MD  methotrexate (RHEUMATREX) 2.5 MG tablet Take 6 tablets by mouth once a week. 10/28/15  Yes Historical Provider, MD  metoprolol (LOPRESSOR) 100 MG tablet Take 100 mg by mouth 2 (two) times daily.   Yes Historical Provider, MD  Multiple Vitamin (MULTI-VITAMINS) TABS Take 1 tablet by mouth daily.   Yes Historical Provider, MD  sertraline (ZOLOFT) 100 MG tablet Take 100 mg by mouth daily.   Yes Historical Provider, MD  traZODone (DESYREL) 50 MG tablet Take 50 mg by mouth daily. 06/20/16  Yes Historical Provider, MD  isosorbide mononitrate (IMDUR) 60 MG 24 hr tablet Take 1 tablet (60 mg total) by mouth daily. Patient not taking: Reported on 06/23/2016 11/11/15   [name], MD     Allergies Atorvastatin; Bisphosphonates; Codeine; Memantine; Omeprazole; Other; Salsalate; Sulfa antibiotics; and Penicillins   Family History  Problem Relation Age of Onset  . Heart failure Mother   . Cancer Father   . Heart attack Sister     Social History Social History  Substance Use Topics  . Smoking status: Never Smoker  . Smokeless tobacco: Never Used  . Alcohol use No    Review of Systems  Constitutional:   No fever or chills.  ENT:   No sore throat. No  rhinorrhea. Lymphatic: No swollen glands, No extremity swelling Endocrine: No hot/cold flashes. No significant weight change. No neck swelling. Cardiovascular:   No chest pain or syncope. Respiratory:   Positive shortness of breath without cough. Gastrointestinal:   Negative for abdominal pain, vomiting and diarrhea.  Genitourinary:   Negative for dysuria or difficulty urinating. Musculoskeletal:   Negative for focal pain or swelling Neurological:   Negative for headaches or weakness. All other systems reviewed and are negative except as documented above in ROS and  HPI.  ____________________________________________   PHYSICAL EXAM:  VITAL SIGNS: ED Triage Vitals  Enc Vitals Group     BP 06/23/16 1734 (!) 168/63     Pulse Rate 06/23/16 1734 70     Resp 06/23/16 1734 18     Temp 06/23/16 1734 97.4 F (36.3 C)     Temp Source 06/23/16 1734 Oral     SpO2 06/23/16 1734 94 %     Weight 06/23/16 1734 154 lb (69.9 kg)     Height 06/23/16 1734 5\' 3"  (1.6 m)     Head Circumference --      Peak Flow --      Pain Score 06/23/16 1739 0     Pain Loc --      Pain Edu? --      Excl. in [place]? --     Vital signs reviewed, nursing assessments reviewed.   Constitutional:   Alert and oriented. Not in distress. Eyes:   No scleral icterus. No conjunctival pallor. PERRL. EOMI.  No nystagmus. ENT   Head:   Normocephalic and atraumatic.   Nose:   No congestion/rhinnorhea. No septal hematoma   Mouth/Throat:   MMM, no pharyngeal erythema. No peritonsillar mass.    Neck:   No stridor. No SubQ emphysema. No meningismus. Hematological/Lymphatic/Immunilogical:   No cervical lymphadenopathy. Cardiovascular:   RRR. Symmetric bilateral radial and DP pulses.  No murmurs.  Respiratory:   Normal respiratory effort without tachypnea nor retractions. Slightly diminished breath sounds at left base. No wheezes or crackles Gastrointestinal:   Soft and nontender. Non distended. There is no CVA tenderness.  No rebound, rigidity, or guarding. Genitourinary:   deferred Musculoskeletal:   Normal range of motion in all extremities. No joint effusions.  No lower extremity tenderness.  No edema. Neurologic:   Normal speech and language.  CN 2-10 normal. Motor grossly intact. No gross focal neurologic deficits are appreciated.  Skin:    Skin is warm, dry and intact. No rash noted.  No petechiae, purpura, or bullae.  ____________________________________________    LABS (pertinent positives/negatives) (all labs ordered are listed, but only abnormal results are  displayed) Labs Reviewed  CBC - Abnormal; Notable for the following:       Result Value   WBC 11.2 (*)    RBC 3.46 (*)    MCV 116.7 (*)    MCH 38.6 (*)    All other components within normal limits  BASIC METABOLIC PANEL - Abnormal; Notable for the following:    Glucose, Bld 106 (*)    Calcium 8.4 (*)    All other components within normal limits  BRAIN NATRIURETIC PEPTIDE - Abnormal; Notable for the following:    B Natriuretic Peptide 276.0 (*)    All other components within normal limits  HEPATIC FUNCTION PANEL - Abnormal; Notable for the following:    Total Protein 6.4 (*)    Albumin 3.3 (*)    Indirect Bilirubin 0.2 (*)    All other  components within normal limits  FIBRIN DERIVATIVES D-DIMER ([facility] ONLY) - Abnormal; Notable for the following:    Fibrin derivatives D-dimer [facility]) 1,376.71 (*)    All other components within normal limits  LIPASE, BLOOD  TROPONIN I  TROPONIN I  TSH   ____________________________________________   EKG  Interpreted by me Sinus rhythm rate of 70. Left axis. Normal intervals. Normal QRS ST segments and T waves  ____________________________________________    RADIOLOGY  Dg Chest 2 View  Result Date: 06/23/2016 CLINICAL DATA:  Weakness, fatigue, shortness of breath EXAM: CHEST  2 VIEW COMPARISON:  11/09/2015 FINDINGS: There is diffuse bilateral chronic interstitial thickening. There is no focal parenchymal opacity. There is no pleural effusion or pneumothorax. There is stable cardiomegaly. There is thoracic aortic atherosclerosis. The osseous structures are unremarkable. IMPRESSION: No active cardiopulmonary disease. Electronically Signed   By: Kathreen Devoid   On: 06/23/2016 18:14   Ct Angio Chest Pe W And/or Wo Contrast  Result Date: 06/23/2016 CLINICAL DATA:  Shortness of breath and generalized abdominal pain. Low oxygen saturation. EXAM: CT ANGIOGRAPHY CHEST WITH CONTRAST TECHNIQUE: Multidetector CT imaging of the chest was performed using  the standard protocol during bolus administration of intravenous contrast. Multiplanar CT image reconstructions and MIPs were obtained to evaluate the vascular anatomy. CONTRAST:  100 cc Isovue 370 COMPARISON:  Chest radiography same day FINDINGS: Cardiovascular: Pulmonary arterial opacification is excellent. No pulmonary emboli. There is aortic atherosclerosis. No aneurysm or dissection. Extensive coronary artery calcification. The heart is mildly enlarged. No pericardial fluid. Mediastinum/Nodes: Mildly prominent mediastinal lymph nodes, likely reactive. Lungs/Pleura: Mild interstitial edema pattern. There may be mild pulmonary scarring as well. No consolidation or collapse. No effusion. Upper Abdomen: See results of abdominal scan. Musculoskeletal: Ordinary degenerative changes affect the spine. Lower thoracic hemangioma. Review of the MIP images confirms the above findings. IMPRESSION: No pulmonary emboli. Aortic atherosclerosis. Coronary artery calcification. Cardiomegaly. Suspicion of mild interstitial edema pattern. No consolidation or collapse. Electronically Signed   By: Nelson Chimes M.D.   On: 06/23/2016 20:45   Ct Abdomen Pelvis W Contrast  Result Date: 06/23/2016 CLINICAL DATA:  Shortness of breath in generalized abdominal pain today. EXAM: CT ABDOMEN AND PELVIS WITH CONTRAST TECHNIQUE: Multidetector CT imaging of the abdomen and pelvis was performed using the standard protocol following bolus administration of intravenous contrast. CONTRAST:  100 cc Isovue 370 COMPARISON:  12/03/2013 FINDINGS: Lower chest: See results of complete chest study. Hepatobiliary: Normal appearance of the hepatic parenchyma. Previous cholecystectomy. No ductal dilatation. Pancreas: Normal Spleen: Normal Adrenals/Urinary Tract: Mild adrenal prominence suggesting hyperplasia. No focal mass. Renal parenchyma is normal bilaterally. No obstruction. Bladder appears normal. Stomach/Bowel: No acute bowel finding. Extensive sigmoid  diverticulosis but without evidence of diverticulitis. No evidence of bowel obstruction. Vascular/Lymphatic: Aortic atherosclerosis. No aneurysm. IVC is normal. No retroperitoneal mass or adenopathy. Reproductive: Previous hysterectomy.  No pelvic mass. Other: No free fluid or air. Musculoskeletal: Chronic lumbar degenerative changes. Anterolisthesis at L4-5 a 1 cm due to facet arthropathy on the right an unilateral pars defect on the left. IMPRESSION: No acute abdominal finding.  No cause of pain identified. Sigmoid diverticulosis without evidence of diverticulitis. Aortic atherosclerosis. Lower lumbar degenerative changes including 1 cm anterolisthesis at L4-5 with likely stenosis. Electronically Signed   By: Nelson Chimes M.D.   On: 06/23/2016 20:50    ____________________________________________   PROCEDURES Procedures  ____________________________________________   INITIAL IMPRESSION / ASSESSMENT AND PLAN / ED COURSE  Pertinent labs & imaging results that were available  during my care of the patient were reviewed by me and considered in my medical decision making (see chart for details).  Patient presents with generalized weakness and shortness of breath. Differential includes myocardial infarction, pulmonary embolism, pneumonia pneumothorax, dehydration. IV fluids, check labs chest x-ray. If the initial workup is negative the patient will need a CT angiogram of the chest to evaluate for PE. Presentation not consistent with unstable angina. We'll follow-up troponin.     Clinical Course as of Jun 24 2254  Fri Jun 23, 2016  1934 Cxr neg. D dimer high. Will get CTA chest.   [PS]  2101 CT neg. Will check serial trop.   [PS]  2145 Patient updated. She does note that her symptoms today were worse after she walked a block to the grocery, went shopping and then walked to block home. She does not use a cane or walker.  Thyroid panel was performed November 2017 and was normal.  [PS]     Clinical Course User Index [PS] Carrie Mew, MD     ----------------------------------------- 10:56 PM on 06/23/2016 -----------------------------------------  Second troponin negative. Discharge home to follow up with primary care.  ____________________________________________   FINAL CLINICAL IMPRESSION(S) / ED DIAGNOSES  Final diagnoses:  Generalized weakness  Fatigue     New Prescriptions   No medications on file     Portions of this note were generated with dragon dictation software. Dictation errors may occur despite best attempts at proofreading.    Carrie Mew, MD 06/23/16 2256

## 2016-06-23 NOTE — Discharge Instructions (Signed)
Your lab tests and CT scans today were unremarkable.  There is no apparent cause for your symptoms. Stay hydrated, eat regularly, get lots of sleep, and follow up with your doctor on Monday. Return to the ER if you have new or worsening symptoms.

## 2016-06-27 DIAGNOSIS — I272 Pulmonary hypertension, unspecified: Secondary | ICD-10-CM | POA: Diagnosis not present

## 2016-06-27 DIAGNOSIS — I10 Essential (primary) hypertension: Secondary | ICD-10-CM | POA: Diagnosis not present

## 2016-06-27 DIAGNOSIS — E7800 Pure hypercholesterolemia, unspecified: Secondary | ICD-10-CM | POA: Diagnosis not present

## 2016-07-03 ENCOUNTER — Ambulatory Visit (INDEPENDENT_AMBULATORY_CARE_PROVIDER_SITE_OTHER): Payer: PPO | Admitting: Podiatry

## 2016-07-03 ENCOUNTER — Encounter: Payer: Self-pay | Admitting: Podiatry

## 2016-07-03 DIAGNOSIS — L600 Ingrowing nail: Secondary | ICD-10-CM | POA: Diagnosis not present

## 2016-07-03 DIAGNOSIS — M1990 Unspecified osteoarthritis, unspecified site: Secondary | ICD-10-CM | POA: Insufficient documentation

## 2016-07-03 DIAGNOSIS — M0600 Rheumatoid arthritis without rheumatoid factor, unspecified site: Secondary | ICD-10-CM | POA: Insufficient documentation

## 2016-07-03 MED ORDER — NEOMYCIN-POLYMYXIN-HC 1 % OT SOLN: OTIC | 1 refills | Status: DC

## 2016-07-03 NOTE — Patient Instructions (Signed)

## 2016-07-03 NOTE — Progress Notes (Signed)
She presents today with her sister with a chief complaint of pain to the medial border of the left hallux. She states this been red and swollen and painful since her nails were done just the other day.  Objective: Vital signs are stable alert and oriented 3. Pulses are palpable [name] [name] neurologic disorder is intact. Degenerative flexors are intact. Muscle strength is 5 over 5 dorsiflexion plantar flexors and inverters everters on his musculature is intact. Orthopedic evaluation of [name] [name] joints distal to the ankle for range of motion or crepitation. Cutaneous evaluation demonstrates of well-hydrated cutis there is no erythema edema cellulitis drainage or odor. Sharp radial nail margin along the tibial border of the hallux left is exquisitely painful. There is no purulence no malodor.  Assessment: Ingrown nail paronychia abscess hallux left.  Plan: Chemical X x-rays performed today of her local anesthesia was administered. She tolerated the procedure well without complications. Applied phenol and neutralized with isopropyl alcohol. Dressed a compressive dressing was applied. She was given both oral and written home-going instructions for care and soaking of her toe twice daily. She is also provided with a prescription for Cortisporin Otic be applied twice daily after soaking. I will follow-up with her in 2 weeks.

## 2016-07-12 ENCOUNTER — Ambulatory Visit (INDEPENDENT_AMBULATORY_CARE_PROVIDER_SITE_OTHER): Payer: PPO | Admitting: Podiatry

## 2016-07-12 ENCOUNTER — Encounter: Payer: Self-pay | Admitting: Podiatry

## 2016-07-12 DIAGNOSIS — L600 Ingrowing nail: Secondary | ICD-10-CM

## 2016-07-12 NOTE — Patient Instructions (Signed)

## 2016-07-12 NOTE — Progress Notes (Signed)
She presents today for follow-up of her matrixectomy hallux right. She denies fever chills nausea vomiting basis doing very well. No pain.  Objective: Vital signs are stable she's alert and oriented 3 no erythema edema cellulitis drainage or odor.  Assessment: Well-healing surgical toe hallux right.  Plan: Follow-up with me as needed. Recommended that she continue to soak for the next week.

## 2016-08-16 DIAGNOSIS — Z0000 Encounter for general adult medical examination without abnormal findings: Secondary | ICD-10-CM | POA: Diagnosis not present

## 2016-08-16 DIAGNOSIS — M48062 Spinal stenosis, lumbar region with neurogenic claudication: Secondary | ICD-10-CM | POA: Diagnosis not present

## 2016-08-16 DIAGNOSIS — M0600 Rheumatoid arthritis without rheumatoid factor, unspecified site: Secondary | ICD-10-CM | POA: Diagnosis not present

## 2016-08-16 DIAGNOSIS — M150 Primary generalized (osteo)arthritis: Secondary | ICD-10-CM | POA: Diagnosis not present

## 2016-08-16 DIAGNOSIS — I10 Essential (primary) hypertension: Secondary | ICD-10-CM | POA: Diagnosis not present

## 2016-08-16 DIAGNOSIS — E7800 Pure hypercholesterolemia, unspecified: Secondary | ICD-10-CM | POA: Diagnosis not present

## 2016-08-16 DIAGNOSIS — Z79899 Other long term (current) drug therapy: Secondary | ICD-10-CM | POA: Diagnosis not present

## 2016-09-07 DIAGNOSIS — M0600 Rheumatoid arthritis without rheumatoid factor, unspecified site: Secondary | ICD-10-CM | POA: Diagnosis not present

## 2016-09-07 DIAGNOSIS — I10 Essential (primary) hypertension: Secondary | ICD-10-CM | POA: Diagnosis not present

## 2016-09-07 DIAGNOSIS — E7800 Pure hypercholesterolemia, unspecified: Secondary | ICD-10-CM | POA: Diagnosis not present

## 2016-09-07 DIAGNOSIS — Z79899 Other long term (current) drug therapy: Secondary | ICD-10-CM | POA: Diagnosis not present

## 2016-11-02 DIAGNOSIS — H35372 Puckering of macula, left eye: Secondary | ICD-10-CM | POA: Diagnosis not present

## 2016-12-07 DIAGNOSIS — M48062 Spinal stenosis, lumbar region with neurogenic claudication: Secondary | ICD-10-CM | POA: Diagnosis not present

## 2016-12-07 DIAGNOSIS — M0600 Rheumatoid arthritis without rheumatoid factor, unspecified site: Secondary | ICD-10-CM | POA: Diagnosis not present

## 2016-12-07 DIAGNOSIS — M5416 Radiculopathy, lumbar region: Secondary | ICD-10-CM | POA: Diagnosis not present

## 2016-12-07 DIAGNOSIS — Z79899 Other long term (current) drug therapy: Secondary | ICD-10-CM | POA: Diagnosis not present

## 2017-01-08 DIAGNOSIS — G629 Polyneuropathy, unspecified: Secondary | ICD-10-CM | POA: Diagnosis not present

## 2017-01-08 DIAGNOSIS — M0600 Rheumatoid arthritis without rheumatoid factor, unspecified site: Secondary | ICD-10-CM | POA: Diagnosis not present

## 2017-01-08 DIAGNOSIS — Z79899 Other long term (current) drug therapy: Secondary | ICD-10-CM | POA: Diagnosis not present

## 2017-01-25 DIAGNOSIS — M48062 Spinal stenosis, lumbar region with neurogenic claudication: Secondary | ICD-10-CM | POA: Diagnosis not present

## 2017-01-25 DIAGNOSIS — M5136 Other intervertebral disc degeneration, lumbar region: Secondary | ICD-10-CM | POA: Diagnosis not present

## 2017-01-25 DIAGNOSIS — M5416 Radiculopathy, lumbar region: Secondary | ICD-10-CM | POA: Diagnosis not present

## 2017-03-07 DIAGNOSIS — M48062 Spinal stenosis, lumbar region with neurogenic claudication: Secondary | ICD-10-CM | POA: Diagnosis not present

## 2017-03-07 DIAGNOSIS — M5416 Radiculopathy, lumbar region: Secondary | ICD-10-CM | POA: Diagnosis not present

## 2017-03-18 ENCOUNTER — Emergency Department: Payer: PPO

## 2017-03-18 ENCOUNTER — Emergency Department
Admission: EM | Admit: 2017-03-18 | Discharge: 2017-03-20 | Disposition: A | Discharge status: Home / Self Care | Payer: PPO | Attending: Emergency Medicine | Admitting: Emergency Medicine

## 2017-03-18 ENCOUNTER — Encounter: Payer: Self-pay | Admitting: *Deleted

## 2017-03-18 ENCOUNTER — Other Ambulatory Visit: Payer: Self-pay

## 2017-03-18 DIAGNOSIS — J439 Emphysema, unspecified: Secondary | ICD-10-CM | POA: Diagnosis not present

## 2017-03-18 DIAGNOSIS — S99911A Unspecified injury of right ankle, initial encounter: Secondary | ICD-10-CM | POA: Diagnosis not present

## 2017-03-18 DIAGNOSIS — S0990XA Unspecified injury of head, initial encounter: Secondary | ICD-10-CM | POA: Diagnosis present

## 2017-03-18 DIAGNOSIS — Y929 Unspecified place or not applicable: Secondary | ICD-10-CM | POA: Diagnosis not present

## 2017-03-18 DIAGNOSIS — Y9389 Activity, other specified: Secondary | ICD-10-CM | POA: Diagnosis not present

## 2017-03-18 DIAGNOSIS — Z853 Personal history of malignant neoplasm of breast: Secondary | ICD-10-CM | POA: Diagnosis not present

## 2017-03-18 DIAGNOSIS — M542 Cervicalgia: Secondary | ICD-10-CM | POA: Diagnosis not present

## 2017-03-18 DIAGNOSIS — Z7982 Long term (current) use of aspirin: Secondary | ICD-10-CM | POA: Insufficient documentation

## 2017-03-18 DIAGNOSIS — Y999 Unspecified external cause status: Secondary | ICD-10-CM | POA: Insufficient documentation

## 2017-03-18 DIAGNOSIS — S0083XA Contusion of other part of head, initial encounter: Secondary | ICD-10-CM | POA: Diagnosis not present

## 2017-03-18 DIAGNOSIS — S92351A Displaced fracture of fifth metatarsal bone, right foot, initial encounter for closed fracture: Secondary | ICD-10-CM | POA: Diagnosis not present

## 2017-03-18 DIAGNOSIS — S92901A Unspecified fracture of right foot, initial encounter for closed fracture: Secondary | ICD-10-CM | POA: Diagnosis not present

## 2017-03-18 DIAGNOSIS — W1789XA Other fall from one level to another, initial encounter: Secondary | ICD-10-CM | POA: Insufficient documentation

## 2017-03-18 DIAGNOSIS — I10 Essential (primary) hypertension: Secondary | ICD-10-CM | POA: Diagnosis not present

## 2017-03-18 DIAGNOSIS — Z79899 Other long term (current) drug therapy: Secondary | ICD-10-CM | POA: Insufficient documentation

## 2017-03-18 DIAGNOSIS — S6991XA Unspecified injury of right wrist, hand and finger(s), initial encounter: Secondary | ICD-10-CM | POA: Diagnosis not present

## 2017-03-18 DIAGNOSIS — S91311A Laceration without foreign body, right foot, initial encounter: Secondary | ICD-10-CM | POA: Diagnosis not present

## 2017-03-18 DIAGNOSIS — S199XXA Unspecified injury of neck, initial encounter: Secondary | ICD-10-CM | POA: Diagnosis not present

## 2017-03-18 DIAGNOSIS — S92322A Displaced fracture of second metatarsal bone, left foot, initial encounter for closed fracture: Secondary | ICD-10-CM | POA: Diagnosis not present

## 2017-03-18 DIAGNOSIS — S92411A Displaced fracture of proximal phalanx of right great toe, initial encounter for closed fracture: Secondary | ICD-10-CM | POA: Diagnosis not present

## 2017-03-18 DIAGNOSIS — N390 Urinary tract infection, site not specified: Secondary | ICD-10-CM | POA: Diagnosis not present

## 2017-03-18 DIAGNOSIS — S4991XA Unspecified injury of right shoulder and upper arm, initial encounter: Secondary | ICD-10-CM | POA: Diagnosis not present

## 2017-03-18 DIAGNOSIS — S0003XA Contusion of scalp, initial encounter: Secondary | ICD-10-CM | POA: Diagnosis not present

## 2017-03-18 MED ORDER — MORPHINE SULFATE (PF) 4 MG/ML IV SOLN
INTRAVENOUS | Status: AC
  Filled 2017-03-18: qty 1

## 2017-03-18 MED ORDER — LIDOCAINE HCL (PF) 1 % IJ SOLN
INTRAMUSCULAR | Status: AC
  Filled 2017-03-18: qty 5

## 2017-03-18 MED ORDER — MORPHINE SULFATE (PF) 4 MG/ML IV SOLN
4.0000 mg | Freq: Once | INTRAVENOUS | Status: AC
  Administered 2017-03-18: 4 mg via INTRAMUSCULAR
  Filled 2017-03-18: qty 1

## 2017-03-18 NOTE — ED Notes (Signed)
Right foot wound cleansed, dressing applied with kling wrap prior to placement of post op shoe. Pt tolerated procedure well. md notified of foot wound and cleansing with dressing placement.

## 2017-03-18 NOTE — Discharge Instructions (Signed)
You should follow-up with Dr. Sabra Heck from orthopedics within the next week to reevaluate your foot, as well as the tenderness and possible fracture in the wrist.  Keep the splint on at all times until you follow-up.  You should use the hard shoe whenever walking, but can bear weight as tolerated until you follow-up.  Return to the emergency department for new or worsening pain, worsening or persistent bleeding, inability to walk, weakness or lightheadedness, or any other new or worsening symptoms that concern you.
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
## 2017-03-18 NOTE — ED Notes (Signed)
Ed techs attempting to ambulate pt with walker, foot wound that was dressed is now bleeding. md notified. md states he will place 2-3 stiches over wound. Will attempt to ambulate pt after suturing.

## 2017-03-18 NOTE — ED Notes (Signed)
Pt ambulated four steps to toliet, became unsteady and states she felt too weak to continue. Pt assisted back to bed, md notified.

## 2017-03-18 NOTE — ED Notes (Signed)
md in to suture wound.
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
## 2017-03-18 NOTE — ED Notes (Signed)
Pt to ct and xray

## 2017-03-18 NOTE — ED Notes (Signed)
Report from iris, rn. 

## 2017-03-18 NOTE — ED Notes (Signed)
md in to speak with pt and visitors.

## 2017-03-19 ENCOUNTER — Encounter
Admission: RE | Admit: 2017-03-19 | Discharge: 2017-03-19 | Disposition: A | Discharge status: Home / Self Care | Payer: PPO | Source: Ambulatory Visit | Attending: Internal Medicine | Admitting: Internal Medicine

## 2017-03-19 ENCOUNTER — Emergency Department: Payer: PPO

## 2017-03-19 DIAGNOSIS — J439 Emphysema, unspecified: Secondary | ICD-10-CM | POA: Diagnosis not present

## 2017-03-19 DIAGNOSIS — R410 Disorientation, unspecified: Secondary | ICD-10-CM | POA: Insufficient documentation

## 2017-03-19 LAB — BASIC METABOLIC PANEL
Anion gap: 10 (ref 5–15)
BUN: 20 mg/dL (ref 6–20)
CHLORIDE: 106 mmol/L (ref 101–111)
CO2: 23 mmol/L (ref 22–32)
CREATININE: 0.75 mg/dL (ref 0.44–1.00)
Calcium: 8.5 mg/dL — ABNORMAL LOW (ref 8.9–10.3)
GFR calc Af Amer: >60 mL/min (ref >60)
GFR calc non Af Amer: >60 mL/min (ref >60)
Glucose, Bld: 126 mg/dL — ABNORMAL HIGH (ref 65–99)
Potassium: 4.1 mmol/L (ref 3.5–5.1)
Sodium: 139 mmol/L (ref 135–145)

## 2017-03-19 LAB — CBC WITH DIFFERENTIAL/PLATELET
BASOS PCT: 1 %
Basophils Absolute: 0.2 10*3/uL — ABNORMAL HIGH (ref 0–0.1)
EOS PCT: 1 %
Eosinophils Absolute: 0.2 10*3/uL (ref 0–0.7)
HCT: 41.7 % (ref 35.0–47.0)
Hemoglobin: 13.8 g/dL (ref 12.0–16.0)
LYMPHS ABS: 1.5 10*3/uL (ref 1.0–3.6)
Lymphocytes Relative: 7 %
MCH: 39 pg — AB (ref 26.0–34.0)
MCHC: 33 g/dL (ref 32.0–36.0)
MCV: 118.1 fL — AB (ref 80.0–100.0)
MONO ABS: 2.1 10*3/uL — AB (ref 0.2–0.9)
Monocytes Relative: 10 %
Neutro Abs: 17.1 10*3/uL — ABNORMAL HIGH (ref 1.4–6.5)
Neutrophils Relative %: 81 %
Platelets: 239 10*3/uL (ref 150–440)
RBC: 3.53 MIL/uL — ABNORMAL LOW (ref 3.80–5.20)
RDW: 16.4 % — AB (ref 11.5–14.5)
WBC: 21.1 10*3/uL — ABNORMAL HIGH (ref 3.6–11.0)

## 2017-03-19 LAB — URINALYSIS, COMPLETE (UACMP) WITH MICROSCOPIC
Bilirubin Urine: NEGATIVE
Glucose, UA: NEGATIVE mg/dL
Hgb urine dipstick: NEGATIVE
KETONES UR: NEGATIVE mg/dL
Leukocytes, UA: NEGATIVE
Nitrite: NEGATIVE
PH: 6 (ref 5.0–8.0)
PROTEIN: NEGATIVE mg/dL
Specific Gravity, Urine: 1.015 (ref 1.005–1.030)

## 2017-03-19 MED ORDER — OXYCODONE-ACETAMINOPHEN 5-325 MG PO TABS
1.0000 | ORAL_TABLET | Freq: Once | ORAL | Status: AC
Start: 2017-03-19 — End: 2017-03-19
  Administered 2017-03-19: 1 via ORAL
  Filled 2017-03-19: qty 1

## 2017-03-19 MED ORDER — OXYCODONE-ACETAMINOPHEN 5-325 MG PO TABS
1.0000 | ORAL_TABLET | Freq: Once | ORAL | Status: AC
  Administered 2017-03-19: 1 via ORAL
  Filled 2017-03-19: qty 1

## 2017-03-19 NOTE — Clinical Social Work Note (Addendum)
CSW met with pt at bedside to address social work consult for "weakness and disposition." CM Cheryl accompanied CSW. Pt lives alone at home. Pt states she would like to return home and has a friend that is willing to stay with her for as long as pt needs. Pt was evaluated by P/T this morning and CSW awaiting recommendations.   2pm- P/T recommended SNF. CSW met with pt at bedside to update. Sister-In-Law-[name] present at bedside. Pt agreeable to SNF and prefers [facility] . CSW provided SNF list and pt agreeable to fax out. CSW completed FL-2 and faxed out via the [facility]. PASRR# already exists. CSW initiated insurance auth with [employer]). CSW spoke with Tammy [phone]) at [employer] who stated insurance auth decision will likely be provided Tuesday 1/22. CSW received a call from Stuckey at [facility] [phone]) confirming bed offer. Pt, RN, and EDP have been updated. CSW updated pt's sister/POA [name] [name]-[phone], at pt's request. CSW continuing to follow for discharge needs.   [name] [name], Latanya Presser, [place] Social Worker-ED [phone]

## 2017-03-19 NOTE — ED Notes (Signed)
Report to sylvia, rn.  

## 2017-03-19 NOTE — ED Notes (Signed)
Lunch tray with juice given to pt

## 2017-03-19 NOTE — ED Notes (Signed)
Pt visitor left to go home and get her clothing - pt is in bed resting with eyes closed
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
## 2017-03-19 NOTE — ED Notes (Signed)
SW at bedside.

## 2017-03-19 NOTE — ED Notes (Signed)
md notified of elevated wbc, order for ua and chest xray received.

## 2017-03-19 NOTE — NC FL2 (Signed)
[place] LEVEL OF CARE SCREENING TOOL     IDENTIFICATION  Patient Name: [name] [name] Birthdate: Jun 06, 1930 Sex: female Admission Date (Current Location): 03/18/2017  [place] and [place] Number:  Engineering geologist and Address:  [facility], [address], [place], [place] [address]      Provider Number: [id number]  Attending Physician Name and Address:  No att. providers found  Relative Name and Phone Number:       Current Level of Care: Hospital Recommended Level of Care: [place] Prior Approval Number:    Date Approved/Denied:   PASRR Number: [id number]  Discharge Plan: SNF    Current Diagnoses: Patient Active Problem List   Diagnosis Date Noted  . Osteoarthritis 07/03/2016  . Rheumatoid arthritis without elevated rheumatoid factor ([place]) 07/03/2016  . Pulmonary HTN ([place]) 06/19/2016  . Chest pain 11/09/2015  . GERD (gastroesophageal reflux disease) 10/06/2013  . HTN (hypertension) 10/06/2013  . Hyperlipidemia, unspecified 10/06/2013  . Osteoporosis 10/06/2013  . Lumbar radiculitis 07/08/2013  . Lumbar spondylosis 07/08/2013  . Lumbar stenosis with neurogenic claudication 07/08/2013    Orientation RESPIRATION BLADDER Height & Weight     Self, Time, Situation, Place  Normal Continent Weight: 154 lb (69.9 kg) Height:  5\' 3"  (160 cm)  BEHAVIORAL SYMPTOMS/MOOD NEUROLOGICAL BOWEL NUTRITION STATUS      Continent Diet(Heart healthy diet)  AMBULATORY STATUS COMMUNICATION OF NEEDS Skin   Extensive Assist Verbally Normal                       Personal Care Assistance Level of Assistance  Bathing, Feeding, Dressing Bathing Assistance: Maximum assistance Feeding assistance: Limited assistance Dressing Assistance: Maximum assistance     Functional Limitations Info  Sight, Hearing, Speech Sight Info: Adequate Hearing Info: Adequate Speech Info: Adequate    SPECIAL CARE FACTORS FREQUENCY  PT (By  licensed PT)     PT Frequency: 5x              Contractures Contractures Info: Not present    Additional Factors Info  Code Status, Allergies Code Status Info: Full Allergies Info: Atorvastatin, Bisphosphonates, Codeine, Memantine, Omeprazole, Other, Salsalate, Sulfa Antibiotics, Penicillins           Current Medications (03/19/2017):  This is the current hospital active medication list No current facility-administered medications for this encounter.    Current Outpatient Medications  Medication Sig Dispense Refill  . amLODipine (NORVASC) 10 MG tablet Take 1 tablet by mouth daily.    . Ascorbic Acid (VITAMIN C) 1000 MG tablet Take 1,000 mg by mouth daily.    Marland Kitchen aspirin EC 81 MG tablet Take 81 mg by mouth daily.    . folic acid (FOLVITE) 1 MG tablet Take 1 mg by mouth daily.    Marland Kitchen losartan (COZAAR) 100 MG tablet Take 100 mg by mouth daily.    . methotrexate (RHEUMATREX) 2.5 MG tablet Take 6 tablets by mouth once a week.    . metoprolol (LOPRESSOR) 100 MG tablet Take 100 mg by mouth 2 (two) times daily.    . Multiple Vitamin (MULTI-VITAMINS) TABS Take 1 tablet by mouth daily.    . sertraline (ZOLOFT) 100 MG tablet Take 100 mg by mouth daily.    Marland Kitchen acetaminophen (TYLENOL) 650 MG CR tablet Take 1,300 mg by mouth 2 (two) times daily.    . isosorbide mononitrate (IMDUR) 60 MG 24 hr tablet Take 1 tablet (60 mg total) by mouth daily. (Patient  not taking: Reported on 06/23/2016) 30 tablet 0  . NEOMYCIN-POLYMYXIN-HYDROCORTISONE (CORTISPORIN) 1 % SOLN otic solution Apply 1-2 drops to toe BID after soaking (Patient not taking: Reported on 03/19/2017) 10 mL 1     Discharge Medications: Please see discharge summary for a list of discharge medications.  Relevant Imaging Results:  Relevant Lab Results:   Additional Information SSN: [id number]  Truitt Merle, LCSW

## 2017-03-19 NOTE — ED Notes (Addendum)
Pt requesting pain medication for her foot. Pt encouraged to leave oxygen in place. Po fluids at bedside. Pt repositioned in bed for comfort.

## 2017-03-19 NOTE — ED Notes (Signed)
Went to pt room to check on pt and she had climbed out the foot of the bed and stated that she did not know where she was or where she needed to be - pt reoriented to surroundings and placed back in bed - pt )2 sat 88% ra - placed on 2L via n/c

## 2017-03-19 NOTE — ED Notes (Signed)
PT at bedside.

## 2017-03-19 NOTE — ED Notes (Signed)
Oxygen at 2lpm placed for pox on ra of 89%.

## 2017-03-19 NOTE — ED Notes (Signed)
Pt assisted with 2 person assist to the toilet to void and then assisted from reclining chair back to bed per pt request - pt desat'ing to 90% on RA - placed on 2L viz n/c - O2 sat improved to 97%

## 2017-03-19 NOTE — ED Notes (Signed)
Pt given supper tray and juice

## 2017-03-19 NOTE — ED Notes (Signed)
Pt given warm blankets and repositioned - lights turned out for resting

## 2017-03-19 NOTE — ED Notes (Signed)
Pt is awake, alert and working denies any pain at present, declines need to use restroom, informed pt she had a friend calling to check on her, RN provided pt with phone. Informed pt at present waiting on PT and SW consult to evaluate plan of care.

## 2017-03-20 DIAGNOSIS — S82891D Other fracture of right lower leg, subsequent encounter for closed fracture with routine healing: Secondary | ICD-10-CM | POA: Diagnosis not present

## 2017-03-20 DIAGNOSIS — W19XXXA Unspecified fall, initial encounter: Secondary | ICD-10-CM | POA: Diagnosis present

## 2017-03-20 DIAGNOSIS — I10 Essential (primary) hypertension: Secondary | ICD-10-CM | POA: Diagnosis not present

## 2017-03-20 DIAGNOSIS — R410 Disorientation, unspecified: Secondary | ICD-10-CM | POA: Diagnosis not present

## 2017-03-20 DIAGNOSIS — Z7982 Long term (current) use of aspirin: Secondary | ICD-10-CM | POA: Diagnosis not present

## 2017-03-20 DIAGNOSIS — M6281 Muscle weakness (generalized): Secondary | ICD-10-CM | POA: Diagnosis not present

## 2017-03-20 DIAGNOSIS — M810 Age-related osteoporosis without current pathological fracture: Secondary | ICD-10-CM | POA: Diagnosis not present

## 2017-03-20 DIAGNOSIS — M069 Rheumatoid arthritis, unspecified: Secondary | ICD-10-CM | POA: Diagnosis not present

## 2017-03-20 DIAGNOSIS — S63601A Unspecified sprain of right thumb, initial encounter: Secondary | ICD-10-CM | POA: Diagnosis not present

## 2017-03-20 DIAGNOSIS — A419 Sepsis, unspecified organism: Secondary | ICD-10-CM | POA: Diagnosis not present

## 2017-03-20 DIAGNOSIS — M48062 Spinal stenosis, lumbar region with neurogenic claudication: Secondary | ICD-10-CM | POA: Diagnosis not present

## 2017-03-20 DIAGNOSIS — Z9181 History of falling: Secondary | ICD-10-CM | POA: Diagnosis not present

## 2017-03-20 DIAGNOSIS — I272 Pulmonary hypertension, unspecified: Secondary | ICD-10-CM | POA: Diagnosis not present

## 2017-03-20 DIAGNOSIS — G8911 Acute pain due to trauma: Secondary | ICD-10-CM | POA: Diagnosis not present

## 2017-03-20 DIAGNOSIS — Z8249 Family history of ischemic heart disease and other diseases of the circulatory system: Secondary | ICD-10-CM | POA: Diagnosis not present

## 2017-03-20 DIAGNOSIS — Z882 Allergy status to sulfonamides status: Secondary | ICD-10-CM | POA: Diagnosis not present

## 2017-03-20 DIAGNOSIS — Z9010 Acquired absence of unspecified breast and nipple: Secondary | ICD-10-CM | POA: Diagnosis not present

## 2017-03-20 DIAGNOSIS — K5900 Constipation, unspecified: Secondary | ICD-10-CM | POA: Diagnosis not present

## 2017-03-20 DIAGNOSIS — Z90710 Acquired absence of both cervix and uterus: Secondary | ICD-10-CM | POA: Diagnosis not present

## 2017-03-20 DIAGNOSIS — S92411D Displaced fracture of proximal phalanx of right great toe, subsequent encounter for fracture with routine healing: Secondary | ICD-10-CM | POA: Diagnosis not present

## 2017-03-20 DIAGNOSIS — S6991XD Unspecified injury of right wrist, hand and finger(s), subsequent encounter: Secondary | ICD-10-CM | POA: Diagnosis not present

## 2017-03-20 DIAGNOSIS — S62360A Nondisplaced fracture of neck of second metacarpal bone, right hand, initial encounter for closed fracture: Secondary | ICD-10-CM | POA: Diagnosis not present

## 2017-03-20 DIAGNOSIS — F3341 Major depressive disorder, recurrent, in partial remission: Secondary | ICD-10-CM | POA: Diagnosis not present

## 2017-03-20 DIAGNOSIS — Z853 Personal history of malignant neoplasm of breast: Secondary | ICD-10-CM | POA: Diagnosis not present

## 2017-03-20 DIAGNOSIS — G9340 Encephalopathy, unspecified: Secondary | ICD-10-CM | POA: Diagnosis not present

## 2017-03-20 DIAGNOSIS — S0083XA Contusion of other part of head, initial encounter: Secondary | ICD-10-CM | POA: Diagnosis not present

## 2017-03-20 DIAGNOSIS — L03115 Cellulitis of right lower limb: Secondary | ICD-10-CM | POA: Diagnosis not present

## 2017-03-20 DIAGNOSIS — D72829 Elevated white blood cell count, unspecified: Secondary | ICD-10-CM | POA: Diagnosis not present

## 2017-03-20 DIAGNOSIS — M79671 Pain in right foot: Secondary | ICD-10-CM | POA: Diagnosis present

## 2017-03-20 DIAGNOSIS — S92401A Displaced unspecified fracture of right great toe, initial encounter for closed fracture: Secondary | ICD-10-CM | POA: Diagnosis not present

## 2017-03-20 DIAGNOSIS — N390 Urinary tract infection, site not specified: Secondary | ICD-10-CM | POA: Diagnosis not present

## 2017-03-20 DIAGNOSIS — R262 Difficulty in walking, not elsewhere classified: Secondary | ICD-10-CM | POA: Diagnosis not present

## 2017-03-20 DIAGNOSIS — S92411A Displaced fracture of proximal phalanx of right great toe, initial encounter for closed fracture: Secondary | ICD-10-CM | POA: Diagnosis not present

## 2017-03-20 DIAGNOSIS — K219 Gastro-esophageal reflux disease without esophagitis: Secondary | ICD-10-CM | POA: Diagnosis not present

## 2017-03-20 DIAGNOSIS — Z66 Do not resuscitate: Secondary | ICD-10-CM | POA: Diagnosis not present

## 2017-03-20 LAB — URINALYSIS, COMPLETE (UACMP) WITH MICROSCOPIC
BILIRUBIN URINE: NEGATIVE
Bacteria, UA: NONE SEEN
Glucose, UA: NEGATIVE mg/dL
KETONES UR: NEGATIVE mg/dL
Leukocytes, UA: NEGATIVE
NITRITE: NEGATIVE
PROTEIN: NEGATIVE mg/dL
Specific Gravity, Urine: 1.025 (ref 1.005–1.030)
pH: 5 (ref 5.0–8.0)

## 2017-03-20 MED ORDER — CEFTRIAXONE SODIUM IN DEXTROSE 20 MG/ML IV SOLN: 1.0000 g | Freq: Once | INTRAVENOUS | Status: DC

## 2017-03-20 MED ORDER — OXYCODONE-ACETAMINOPHEN 5-325 MG PO TABS: 1.0000 | ORAL_TABLET | ORAL | 0 refills | Status: DC | PRN

## 2017-03-20 MED ORDER — CEFPODOXIME PROXETIL 100 MG PO TABS: 200.0000 mg | ORAL_TABLET | Freq: Two times a day (BID) | ORAL | 0 refills | Status: DC

## 2017-03-20 MED ORDER — OXYCODONE-ACETAMINOPHEN 5-325 MG PO TABS
1.0000 | ORAL_TABLET | Freq: Four times a day (QID) | ORAL | Status: DC | PRN
  Administered 2017-03-20 (×2): 1 via ORAL
  Filled 2017-03-20 (×2): qty 1

## 2017-03-20 MED ORDER — CEFPODOXIME PROXETIL 200 MG PO TABS
200.0000 mg | ORAL_TABLET | Freq: Once | ORAL | Status: AC
  Administered 2017-03-20: 200 mg via ORAL
  Filled 2017-03-20 (×2): qty 1

## 2017-03-20 NOTE — Clinical Social Work Placement (Signed)
   CLINICAL SOCIAL WORK PLACEMENT  NOTE  Date:  03/20/2017  Patient Details  Name: [name] [name] MRN: [id number] Date of Birth: 06-08-1930  Clinical Social Work is seeking post-discharge placement for this patient at the [place] level of care (*CSW will initial, date and re-position this form in  chart as items are completed):  Yes   Patient/family provided with [place] Work Department's list of facilities offering this level of care within the geographic area requested by the patient (or if unable, by the patient's family).  Yes   Patient/family informed of their freedom to choose among providers that offer the needed level of care, that participate in Medicare, Medicaid or managed care program needed by the patient, have an available bed and are willing to accept the patient.  Yes   Patient/family informed of [place]'s ownership interest in [facility] and [facility], as well as of the fact that they are under no obligation to receive care at these facilities.  PASRR submitted to EDS on       PASRR number received on       Existing PASRR number confirmed on 03/19/17     FL2 transmitted to all facilities in geographic area requested by pt/family on 03/19/17     FL2 transmitted to all facilities within larger geographic area on       Patient informed that his/her managed care company has contracts with or will negotiate with certain facilities, including the following:        Yes   Patient/family informed of bed offers received.  Patient chooses bed at [facility])     Physician recommends and patient chooses bed at (sNF)    Patient to be transferred to [facility]) on 03/20/17.  Patient to be transferred to facility by (family or EMS)     Patient family notified on 03/20/17 of transfer.  Name of family member notified:  (patient's sister)     PHYSICIAN       Additional Comment:     _______________________________________________ Shela Leff, LCSW 03/20/2017, 11:47 AM

## 2017-03-20 NOTE — ED Notes (Signed)
Pt does not wear oxygen at home. Pt taken off O2 to see how she tolerates being off O2.

## 2017-03-20 NOTE — ED Notes (Signed)
Pt called out to use the restroom, pt unable to ambulate to toilet so placed pt on bed pan. Pt warm to touch. Rectal temperature taken. Patient rectal temp 100.4. Dr. Joni Fears notified. Will send urine for UA and culture.

## 2017-03-20 NOTE — ED Notes (Signed)
Spoke with pt family, they would like for EMS to transport patient

## 2017-03-20 NOTE — ED Notes (Signed)
Transportation is here to pick pt up.  Pt continues to be alert and oriented.  Report was called to SNF by previous shift RN.  Belongings will be taken by ems to SNF (2 white patient belongings bags are being sent with family)

## 2017-03-20 NOTE — ED Notes (Signed)
Pt given meal tray.

## 2017-03-20 NOTE — ED Provider Notes (Signed)
Patient had been discharged prior to my shift by Dr. Joni Fears and was in the ED waiting for transport. She has an acute foot fracture and was suppose to have prescription for percocet written by Dr. Joni Fears prior to leaving to rehab but he forgot to leave the script and has requested me to provide her with the script. I did not participate in the care of this patient but I did provide her with a short term prescription for percocet.   Alfred Levins, [place], MD 03/20/17 1600

## 2017-03-20 NOTE — ED Notes (Signed)
Pt 2 person assist to restroom with moderate difficulty.

## 2017-03-20 NOTE — Clinical Social Work Note (Signed)
Tammy with [employer] has informed CSW that they have approved for patient to go to [facility] today with auth: [id number]. CSW is waiting for room and report number from [place] at [place]. Shela Leff MSW,LCSW [phone]

## 2017-03-20 NOTE — ED Provider Notes (Signed)
Patient remains well appearing, not in distress, unremarkable vital signs except for a temperature came back at 100.4. Patient was reassessed, no change in symptoms, not in distress. Lungs are clear to auscultation bilaterally. Not tachycardic, good distal perfusion with normal capillary refill and warm extremities and normal skin color.  Urinalysis was checked which shows so a small amount of white blood cells. I'll treat her for urinary tract infection at this point. She is well-appearing, nontoxic, does not require hospitalization. Not septic. She does have a leukocytosis of 21,000 without any apparent source and may be pain related with her recent fracture. Prescription for cefpodoxime for 1 week provided. Initial oral dose given in the ED.  Final diagnoses:  Contusion of face, initial encounter  Closed fracture of right foot, initial encounter  Injury of right wrist, initial encounter  Lower urinary tract infectious disease      Carrie Mew, MD 03/20/17 1514

## 2017-03-20 NOTE — Clinical Social Work Note (Addendum)
Sharyn Lull at [place] [facility] has assigned patient to [facility] with report being [phone]. CSW has informed patient's nurse now, Caryl Pina, as of 11am of the above information. CSW explained that family is deciding between EMS or family transport and Caryl Pina state she would follow up with family. CSW notified patient's sister of patient's discharge.  Shela Leff MSW,LCSW [phone]

## 2017-03-20 NOTE — ED Notes (Signed)
Pt placed on bed pan again to try and obtain urine sample

## 2017-03-21 ENCOUNTER — Other Ambulatory Visit: Payer: Self-pay

## 2017-03-21 DIAGNOSIS — G9340 Encephalopathy, unspecified: Secondary | ICD-10-CM | POA: Diagnosis not present

## 2017-03-21 LAB — URINE CULTURE

## 2017-03-21 MED ORDER — OXYCODONE HCL 5 MG PO TABS: 5.0000 mg | ORAL_TABLET | ORAL | 0 refills | Status: DC | PRN

## 2017-03-21 NOTE — Telephone Encounter (Signed)
Rx sent to [facility] phone : [phone] , fax : [phone]  

## 2017-03-22 ENCOUNTER — Other Ambulatory Visit: Payer: Self-pay

## 2017-03-22 DIAGNOSIS — R410 Disorientation, unspecified: Secondary | ICD-10-CM | POA: Diagnosis not present

## 2017-03-22 DIAGNOSIS — G9340 Encephalopathy, unspecified: Secondary | ICD-10-CM | POA: Diagnosis not present

## 2017-03-22 LAB — COMPREHENSIVE METABOLIC PANEL
ALBUMIN: 3.7 g/dL (ref 3.5–5.0)
ALK PHOS: 63 U/L (ref 38–126)
ALT: 17 U/L (ref 14–54)
AST: 27 U/L (ref 15–41)
Anion gap: 10 (ref 5–15)
BILIRUBIN TOTAL: 1.1 mg/dL (ref 0.3–1.2)
BUN: 17 mg/dL (ref 6–20)
CALCIUM: 9 mg/dL (ref 8.9–10.3)
CO2: 24 mmol/L (ref 22–32)
CREATININE: 0.72 mg/dL (ref 0.44–1.00)
Chloride: 103 mmol/L (ref 101–111)
GFR calc Af Amer: >60 mL/min (ref >60)
GFR calc non Af Amer: >60 mL/min (ref >60)
GLUCOSE: 94 mg/dL (ref 65–99)
Potassium: 4.2 mmol/L (ref 3.5–5.1)
Sodium: 137 mmol/L (ref 135–145)
TOTAL PROTEIN: 6.8 g/dL (ref 6.5–8.1)

## 2017-03-22 LAB — CBC WITH DIFFERENTIAL/PLATELET
BAND NEUTROPHILS: 0 %
BLASTS: 0 %
Basophils Absolute: 0 10*3/uL (ref 0–0.1)
Basophils Relative: 0 %
EOS ABS: 0.4 10*3/uL (ref 0–0.7)
Eosinophils Relative: 2 %
HEMATOCRIT: 39.6 % (ref 35.0–47.0)
Hemoglobin: 13.8 g/dL (ref 12.0–16.0)
Lymphocytes Relative: 8 %
Lymphs Abs: 1.5 10*3/uL (ref 1.0–3.6)
MCH: 41.2 pg — ABNORMAL HIGH (ref 26.0–34.0)
MCHC: 34.9 g/dL (ref 32.0–36.0)
MCV: 118.1 fL — ABNORMAL HIGH (ref 80.0–100.0)
MONOS PCT: 19 %
Metamyelocytes Relative: 0 %
Monocytes Absolute: 3.5 10*3/uL — ABNORMAL HIGH (ref 0.2–0.9)
Myelocytes: 0 %
NEUTROS ABS: 12.8 10*3/uL — AB (ref 1.4–6.5)
Neutrophils Relative %: 71 %
Other: 0 %
PROMYELOCYTES ABS: 0 %
Platelets: 233 10*3/uL (ref 150–440)
RBC: 3.35 MIL/uL — AB (ref 3.80–5.20)
RDW: 16.2 % — ABNORMAL HIGH (ref 11.5–14.5)
WBC: 18.2 10*3/uL — AB (ref 3.6–11.0)
nRBC: 0 /100 WBC

## 2017-03-22 LAB — URINALYSIS, COMPLETE (UACMP) WITH MICROSCOPIC
Bilirubin Urine: NEGATIVE
GLUCOSE, UA: NEGATIVE mg/dL
Hgb urine dipstick: NEGATIVE
KETONES UR: NEGATIVE mg/dL
Leukocytes, UA: NEGATIVE
Nitrite: NEGATIVE
PH: 6 (ref 5.0–8.0)
Protein, ur: NEGATIVE mg/dL
SPECIFIC GRAVITY, URINE: 1.005 (ref 1.005–1.030)

## 2017-03-22 LAB — PATHOLOGIST SMEAR REVIEW

## 2017-03-22 MED ORDER — OXYCODONE HCL 5 MG PO TABS: 5.0000 mg | ORAL_TABLET | ORAL | 0 refills | Status: DC | PRN

## 2017-03-22 NOTE — Telephone Encounter (Signed)
Rx sent to [facility] phone : [phone] , fax : [phone]  

## 2017-03-23 ENCOUNTER — Other Ambulatory Visit
Admission: RE | Admit: 2017-03-23 | Discharge: 2017-03-23 | Disposition: A | Discharge status: Home / Self Care | Payer: PPO | Source: Ambulatory Visit | Attending: Internal Medicine | Admitting: Internal Medicine

## 2017-03-23 DIAGNOSIS — S62360A Nondisplaced fracture of neck of second metacarpal bone, right hand, initial encounter for closed fracture: Secondary | ICD-10-CM | POA: Diagnosis not present

## 2017-03-23 DIAGNOSIS — S63601A Unspecified sprain of right thumb, initial encounter: Secondary | ICD-10-CM | POA: Diagnosis not present

## 2017-03-23 DIAGNOSIS — S92411A Displaced fracture of proximal phalanx of right great toe, initial encounter for closed fracture: Secondary | ICD-10-CM | POA: Diagnosis not present

## 2017-03-23 DIAGNOSIS — R410 Disorientation, unspecified: Secondary | ICD-10-CM | POA: Diagnosis not present

## 2017-03-23 LAB — CBC WITH DIFFERENTIAL/PLATELET
BASOS ABS: 0.1 10*3/uL (ref 0–0.1)
Basophils Relative: 1 %
EOS ABS: 0.3 10*3/uL (ref 0–0.7)
Eosinophils Relative: 2 %
HCT: 36.6 % (ref 35.0–47.0)
HEMOGLOBIN: 11.9 g/dL — AB (ref 12.0–16.0)
LYMPHS ABS: 1.3 10*3/uL (ref 1.0–3.6)
Lymphocytes Relative: 12 %
MCH: 38.5 pg — AB (ref 26.0–34.0)
MCHC: 32.5 g/dL (ref 32.0–36.0)
MCV: 118.7 fL — ABNORMAL HIGH (ref 80.0–100.0)
Monocytes Absolute: 1.8 10*3/uL — ABNORMAL HIGH (ref 0.2–0.9)
Monocytes Relative: 16 %
NEUTROS PCT: 69 %
Neutro Abs: 8.1 10*3/uL — ABNORMAL HIGH (ref 1.4–6.5)
PLATELETS: 240 10*3/uL (ref 150–440)
RBC: 3.09 MIL/uL — AB (ref 3.80–5.20)
RDW: 15.9 % — ABNORMAL HIGH (ref 11.5–14.5)
WBC: 11.6 10*3/uL — AB (ref 3.6–11.0)

## 2017-03-23 LAB — URINE CULTURE

## 2017-03-26 ENCOUNTER — Other Ambulatory Visit
Admission: RE | Admit: 2017-03-26 | Discharge: 2017-03-26 | Disposition: A | Discharge status: Home / Self Care | Payer: PPO | Source: Ambulatory Visit | Attending: Internal Medicine | Admitting: Internal Medicine

## 2017-03-26 ENCOUNTER — Non-Acute Institutional Stay (SKILLED_NURSING_FACILITY): Payer: PPO | Admitting: Gerontology

## 2017-03-26 ENCOUNTER — Emergency Department: Payer: PPO

## 2017-03-26 ENCOUNTER — Inpatient Hospital Stay
Admission: EM | Admit: 2017-03-26 | Discharge: 2017-04-03 | DRG: 872 | Disposition: A | Discharge status: Skilled Nursing Facility | Payer: PPO | Attending: Family Medicine | Admitting: Family Medicine

## 2017-03-26 ENCOUNTER — Other Ambulatory Visit: Payer: Self-pay

## 2017-03-26 ENCOUNTER — Encounter: Payer: Self-pay | Admitting: Emergency Medicine

## 2017-03-26 DIAGNOSIS — Z8249 Family history of ischemic heart disease and other diseases of the circulatory system: Secondary | ICD-10-CM

## 2017-03-26 DIAGNOSIS — W19XXXA Unspecified fall, initial encounter: Secondary | ICD-10-CM | POA: Diagnosis present

## 2017-03-26 DIAGNOSIS — S92414A Nondisplaced fracture of proximal phalanx of right great toe, initial encounter for closed fracture: Secondary | ICD-10-CM | POA: Diagnosis not present

## 2017-03-26 DIAGNOSIS — D72829 Elevated white blood cell count, unspecified: Secondary | ICD-10-CM | POA: Diagnosis not present

## 2017-03-26 DIAGNOSIS — Z853 Personal history of malignant neoplasm of breast: Secondary | ICD-10-CM

## 2017-03-26 DIAGNOSIS — S92401A Displaced unspecified fracture of right great toe, initial encounter for closed fracture: Secondary | ICD-10-CM | POA: Diagnosis not present

## 2017-03-26 DIAGNOSIS — A419 Sepsis, unspecified organism: Secondary | ICD-10-CM | POA: Diagnosis not present

## 2017-03-26 DIAGNOSIS — M6281 Muscle weakness (generalized): Secondary | ICD-10-CM | POA: Diagnosis not present

## 2017-03-26 DIAGNOSIS — Z7401 Bed confinement status: Secondary | ICD-10-CM | POA: Diagnosis not present

## 2017-03-26 DIAGNOSIS — F0390 Unspecified dementia without behavioral disturbance: Secondary | ICD-10-CM | POA: Diagnosis not present

## 2017-03-26 DIAGNOSIS — M0600 Rheumatoid arthritis without rheumatoid factor, unspecified site: Secondary | ICD-10-CM | POA: Diagnosis not present

## 2017-03-26 DIAGNOSIS — K5900 Constipation, unspecified: Secondary | ICD-10-CM | POA: Diagnosis not present

## 2017-03-26 DIAGNOSIS — Z66 Do not resuscitate: Secondary | ICD-10-CM | POA: Diagnosis present

## 2017-03-26 DIAGNOSIS — Z882 Allergy status to sulfonamides status: Secondary | ICD-10-CM

## 2017-03-26 DIAGNOSIS — K219 Gastro-esophageal reflux disease without esophagitis: Secondary | ICD-10-CM | POA: Diagnosis not present

## 2017-03-26 DIAGNOSIS — M069 Rheumatoid arthritis, unspecified: Secondary | ICD-10-CM | POA: Diagnosis not present

## 2017-03-26 DIAGNOSIS — Z9181 History of falling: Secondary | ICD-10-CM | POA: Diagnosis not present

## 2017-03-26 DIAGNOSIS — Z9010 Acquired absence of unspecified breast and nipple: Secondary | ICD-10-CM | POA: Diagnosis not present

## 2017-03-26 DIAGNOSIS — Z7982 Long term (current) use of aspirin: Secondary | ICD-10-CM | POA: Diagnosis not present

## 2017-03-26 DIAGNOSIS — I10 Essential (primary) hypertension: Secondary | ICD-10-CM | POA: Diagnosis present

## 2017-03-26 DIAGNOSIS — S6991XD Unspecified injury of right wrist, hand and finger(s), subsequent encounter: Secondary | ICD-10-CM | POA: Diagnosis not present

## 2017-03-26 DIAGNOSIS — F3341 Major depressive disorder, recurrent, in partial remission: Secondary | ICD-10-CM | POA: Diagnosis not present

## 2017-03-26 DIAGNOSIS — M810 Age-related osteoporosis without current pathological fracture: Secondary | ICD-10-CM | POA: Diagnosis not present

## 2017-03-26 DIAGNOSIS — S92411A Displaced fracture of proximal phalanx of right great toe, initial encounter for closed fracture: Secondary | ICD-10-CM | POA: Diagnosis not present

## 2017-03-26 DIAGNOSIS — S92411D Displaced fracture of proximal phalanx of right great toe, subsequent encounter for fracture with routine healing: Secondary | ICD-10-CM | POA: Diagnosis not present

## 2017-03-26 DIAGNOSIS — I272 Pulmonary hypertension, unspecified: Secondary | ICD-10-CM | POA: Diagnosis not present

## 2017-03-26 DIAGNOSIS — Z90710 Acquired absence of both cervix and uterus: Secondary | ICD-10-CM

## 2017-03-26 DIAGNOSIS — L03115 Cellulitis of right lower limb: Secondary | ICD-10-CM | POA: Diagnosis present

## 2017-03-26 DIAGNOSIS — G8911 Acute pain due to trauma: Secondary | ICD-10-CM | POA: Diagnosis not present

## 2017-03-26 DIAGNOSIS — S92314A Nondisplaced fracture of first metatarsal bone, right foot, initial encounter for closed fracture: Secondary | ICD-10-CM | POA: Diagnosis not present

## 2017-03-26 DIAGNOSIS — S82891D Other fracture of right lower leg, subsequent encounter for closed fracture with routine healing: Secondary | ICD-10-CM | POA: Diagnosis not present

## 2017-03-26 DIAGNOSIS — M79671 Pain in right foot: Secondary | ICD-10-CM | POA: Diagnosis present

## 2017-03-26 DIAGNOSIS — M48062 Spinal stenosis, lumbar region with neurogenic claudication: Secondary | ICD-10-CM | POA: Diagnosis not present

## 2017-03-26 DIAGNOSIS — R262 Difficulty in walking, not elsewhere classified: Secondary | ICD-10-CM | POA: Diagnosis not present

## 2017-03-26 LAB — BASIC METABOLIC PANEL
Anion gap: 9 (ref 5–15)
BUN: 19 mg/dL (ref 6–20)
CALCIUM: 8.4 mg/dL — AB (ref 8.9–10.3)
CO2: 25 mmol/L (ref 22–32)
CREATININE: 0.66 mg/dL (ref 0.44–1.00)
Chloride: 103 mmol/L (ref 101–111)
Glucose, Bld: 113 mg/dL — ABNORMAL HIGH (ref 65–99)
Potassium: 4 mmol/L (ref 3.5–5.1)
Sodium: 137 mmol/L (ref 135–145)

## 2017-03-26 LAB — CBC WITH DIFFERENTIAL/PLATELET
Basophils Absolute: 0.1 10*3/uL (ref 0–0.1)
Basophils Relative: 1 %
EOS ABS: 0.2 10*3/uL (ref 0–0.7)
EOS PCT: 2 %
HCT: 36.8 % (ref 35.0–47.0)
HEMOGLOBIN: 12.2 g/dL (ref 12.0–16.0)
LYMPHS ABS: 1.3 10*3/uL (ref 1.0–3.6)
LYMPHS PCT: 12 %
MCH: 39.6 pg — AB (ref 26.0–34.0)
MCHC: 33.2 g/dL (ref 32.0–36.0)
MCV: 119.3 fL — AB (ref 80.0–100.0)
MONOS PCT: 17 %
Monocytes Absolute: 1.8 10*3/uL — ABNORMAL HIGH (ref 0.2–0.9)
NEUTROS PCT: 68 %
Neutro Abs: 7.6 10*3/uL — ABNORMAL HIGH (ref 1.4–6.5)
Platelets: 267 10*3/uL (ref 150–440)
RBC: 3.09 MIL/uL — ABNORMAL LOW (ref 3.80–5.20)
RDW: 15.8 % — ABNORMAL HIGH (ref 11.5–14.5)
WBC: 10.9 10*3/uL (ref 3.6–11.0)

## 2017-03-26 LAB — URINALYSIS, COMPLETE (UACMP) WITH MICROSCOPIC
BACTERIA UA: NONE SEEN
BILIRUBIN URINE: NEGATIVE
Glucose, UA: NEGATIVE mg/dL
Hgb urine dipstick: NEGATIVE
KETONES UR: NEGATIVE mg/dL
Leukocytes, UA: NEGATIVE
Nitrite: NEGATIVE
PH: 5 (ref 5.0–8.0)
Protein, ur: NEGATIVE mg/dL
SPECIFIC GRAVITY, URINE: 1.013 (ref 1.005–1.030)

## 2017-03-26 LAB — LACTIC ACID, PLASMA
LACTIC ACID, VENOUS: 0.9 mmol/L (ref 0.5–1.9)
LACTIC ACID, VENOUS: 1.1 mmol/L (ref 0.5–1.9)

## 2017-03-26 LAB — CBC
HEMATOCRIT: 37.1 % (ref 35.0–47.0)
HEMOGLOBIN: 12.5 g/dL (ref 12.0–16.0)
MCH: 40.1 pg — ABNORMAL HIGH (ref 26.0–34.0)
MCHC: 33.7 g/dL (ref 32.0–36.0)
MCV: 118.9 fL — ABNORMAL HIGH (ref 80.0–100.0)
Platelets: 270 10*3/uL (ref 150–440)
RBC: 3.12 MIL/uL — ABNORMAL LOW (ref 3.80–5.20)
RDW: 16.2 % — ABNORMAL HIGH (ref 11.5–14.5)
WBC: 10.9 10*3/uL (ref 3.6–11.0)

## 2017-03-26 LAB — MRSA PCR SCREENING: MRSA BY PCR: NEGATIVE

## 2017-03-26 LAB — SEDIMENTATION RATE: SED RATE: 52 mm/h — AB (ref 0–30)

## 2017-03-26 MED ORDER — AMLODIPINE BESYLATE 10 MG PO TABS
10.0000 mg | ORAL_TABLET | Freq: Every day | ORAL | Status: DC
  Administered 2017-03-27 – 2017-04-03 (×7): 10 mg via ORAL
  Filled 2017-03-26 (×5): qty 1
  Filled 2017-03-26: qty 2
  Filled 2017-03-26 (×2): qty 1

## 2017-03-26 MED ORDER — SERTRALINE HCL 50 MG PO TABS
100.0000 mg | ORAL_TABLET | Freq: Every day | ORAL | Status: DC
  Administered 2017-03-27 – 2017-04-03 (×8): 100 mg via ORAL
  Filled 2017-03-26 (×8): qty 2

## 2017-03-26 MED ORDER — ALBUTEROL SULFATE (2.5 MG/3ML) 0.083% IN NEBU: 2.5000 mg | INHALATION_SOLUTION | RESPIRATORY_TRACT | Status: DC | PRN

## 2017-03-26 MED ORDER — CEFAZOLIN SODIUM-DEXTROSE 1-4 GM-%(50ML) IV SOLR
1.0000 g | Freq: Three times a day (TID) | INTRAVENOUS | Status: DC
  Filled 2017-03-26 (×2): qty 50

## 2017-03-26 MED ORDER — ONDANSETRON HCL 4 MG/2ML IJ SOLN: 4.0000 mg | Freq: Four times a day (QID) | INTRAMUSCULAR | Status: DC | PRN

## 2017-03-26 MED ORDER — VANCOMYCIN HCL 10 G IV SOLR
1500.0000 mg | Freq: Once | INTRAVENOUS | Status: AC
  Administered 2017-03-26: 1500 mg via INTRAVENOUS
  Filled 2017-03-26: qty 1500

## 2017-03-26 MED ORDER — FOLIC ACID 1 MG PO TABS
1.0000 mg | ORAL_TABLET | ORAL | Status: DC
  Administered 2017-03-27 – 2017-04-03 (×7): 1 mg via ORAL
  Filled 2017-03-26 (×6): qty 1

## 2017-03-26 MED ORDER — POLYETHYLENE GLYCOL 3350 17 G PO PACK: 17.0000 g | PACK | Freq: Every day | ORAL | Status: DC | PRN

## 2017-03-26 MED ORDER — METOPROLOL TARTRATE 50 MG PO TABS
100.0000 mg | ORAL_TABLET | Freq: Two times a day (BID) | ORAL | Status: DC
  Administered 2017-03-26 – 2017-04-03 (×14): 100 mg via ORAL
  Filled 2017-03-26 (×16): qty 2

## 2017-03-26 MED ORDER — ONDANSETRON HCL 4 MG PO TABS: 4.0000 mg | ORAL_TABLET | Freq: Four times a day (QID) | ORAL | Status: DC | PRN

## 2017-03-26 MED ORDER — ASPIRIN EC 81 MG PO TBEC
81.0000 mg | DELAYED_RELEASE_TABLET | Freq: Every day | ORAL | Status: DC
  Administered 2017-03-27 – 2017-04-03 (×8): 81 mg via ORAL
  Filled 2017-03-26 (×8): qty 1

## 2017-03-26 MED ORDER — ACETAMINOPHEN 650 MG RE SUPP
650.0000 mg | Freq: Four times a day (QID) | RECTAL | Status: DC | PRN
Start: 2017-03-26 — End: 2017-04-03

## 2017-03-26 MED ORDER — FOLIC ACID 1 MG PO TABS: 1.0000 mg | ORAL_TABLET | Freq: Every day | ORAL | Status: DC

## 2017-03-26 MED ORDER — DOCUSATE SODIUM 100 MG PO CAPS
100.0000 mg | ORAL_CAPSULE | Freq: Two times a day (BID) | ORAL | Status: DC
  Administered 2017-03-26 – 2017-03-31 (×10): 100 mg via ORAL
  Filled 2017-03-26 (×12): qty 1

## 2017-03-26 MED ORDER — ACETAMINOPHEN 325 MG PO TABS
650.0000 mg | ORAL_TABLET | Freq: Four times a day (QID) | ORAL | Status: DC | PRN
  Administered 2017-03-27 – 2017-04-01 (×4): 650 mg via ORAL
  Filled 2017-03-26 (×4): qty 2

## 2017-03-26 MED ORDER — FENTANYL CITRATE (PF) 100 MCG/2ML IJ SOLN
50.0000 ug | Freq: Once | INTRAMUSCULAR | Status: AC
  Administered 2017-03-26: 50 ug via INTRAVENOUS
  Filled 2017-03-26: qty 2

## 2017-03-26 MED ORDER — CEFAZOLIN SODIUM 1 G IJ SOLR
1.0000 g | Freq: Three times a day (TID) | INTRAMUSCULAR | Status: DC
  Administered 2017-03-26 – 2017-03-29 (×10): 1 g via INTRAVENOUS
  Filled 2017-03-26 (×12): qty 10

## 2017-03-26 MED ORDER — SODIUM CHLORIDE 0.9 % IV BOLUS (SEPSIS)
1000.0000 mL | Freq: Once | INTRAVENOUS | Status: AC
  Administered 2017-03-26: 1000 mL via INTRAVENOUS

## 2017-03-26 MED ORDER — ISOSORBIDE MONONITRATE ER 30 MG PO TB24
30.0000 mg | ORAL_TABLET | Freq: Every day | ORAL | Status: DC
  Administered 2017-03-27 – 2017-04-03 (×7): 30 mg via ORAL
  Filled 2017-03-26 (×8): qty 1

## 2017-03-26 MED ORDER — TRAZODONE HCL 50 MG PO TABS
50.0000 mg | ORAL_TABLET | Freq: Every day | ORAL | Status: DC
  Administered 2017-03-26 – 2017-04-02 (×7): 50 mg via ORAL
  Filled 2017-03-26 (×9): qty 1

## 2017-03-26 MED ORDER — ENOXAPARIN SODIUM 40 MG/0.4ML ~~LOC~~ SOLN
40.0000 mg | SUBCUTANEOUS | Status: DC
  Administered 2017-03-26 – 2017-04-02 (×8): 40 mg via SUBCUTANEOUS
  Filled 2017-03-26 (×8): qty 0.4

## 2017-03-26 MED ORDER — TRAMADOL HCL 50 MG PO TABS
50.0000 mg | ORAL_TABLET | Freq: Four times a day (QID) | ORAL | Status: DC | PRN
  Administered 2017-03-26 – 2017-04-01 (×8): 50 mg via ORAL
  Filled 2017-03-26 (×8): qty 1

## 2017-03-26 MED ORDER — METHOTREXATE 2.5 MG PO TABS
15.0000 mg | ORAL_TABLET | ORAL | Status: DC
  Administered 2017-03-30: 15 mg via ORAL
  Filled 2017-03-26 (×2): qty 6

## 2017-03-26 NOTE — [place] Notes (Signed)
Pt admitted to room 141 from ED. Pt was A&Ox4. Right leg/foot is red, warm, and extremely tender to touch with 2 sutures intact to right foot. Doppler is used to assess pulse, RLE has +1 edema and has scattered abrasions, with a blister on her right foot. Pt has a brace on her right wrist/thumb. Pt is oriented to room and unit routine, her daughter is present at bedside.   [place], Jerry Caras

## 2017-03-26 NOTE — H&P (Signed)
[place] [facility] [place] NAME: Vena Bassinger    MR#:  [id number]  DATE OF BIRTH:  1930-12-24  DATE OF ADMISSION:  03/26/2017  PRIMARY CARE PHYSICIAN: Idelle Crouch, MD   REQUESTING/REFERRING PHYSICIAN: Dr. Mariea Clonts  CHIEF COMPLAINT:   Chief Complaint  Patient presents with  . Foot Pain    HISTORY OF PRESENT ILLNESS:  Dauna Ziska  is a 84 y.o. female with a known history of hypertension, breast cancer, DNR who was recently here in the emergency room on 03/20/2017 and was diagnosed with right foot fracture along with some sutures placed.  He was discharged home and has been working with physical therapy at [facility].  Today she was seen by her primary care physician and was found to have redness, swelling of the right foot along with white count elevated at 22,000 and sent to the emergency room.  Afebrile.  Patient does feel fatigued.  She has some mild cognitive impairment at baseline according to family but has been worse since getting started on Percocet.  Presently she is alert and oriented. Overall poor historian.  Old records reviewed.  Also history obtained from family at bedside.  X-ray of the right foot shows no osteomyelitis.  PAST MEDICAL HISTORY:   Past Medical History:  Diagnosis Date  . breast cancer   . Hypertension     PAST SURGICAL HISTORY:   Past Surgical History:  Procedure Laterality Date  . ABDOMINAL HYSTERECTOMY    . CHOLECYSTECTOMY    . MASTECTOMY    . VASCULAR SURGERY      SOCIAL HISTORY:   Social History   Tobacco Use  . Smoking status: Never Smoker  . Smokeless tobacco: Never Used  Substance Use Topics  . Alcohol use: No    FAMILY HISTORY:   Family History  Problem Relation Age of Onset  . Heart failure Mother   . Cancer Father   . Heart attack Sister     DRUG ALLERGIES:   Allergies  Allergen Reactions  . Atorvastatin Other (See Comments)    Myositis  . Bisphosphonates  Other (See Comments)    GI upset  . Codeine Other (See Comments)  . Memantine Other (See Comments)  . Omeprazole Nausea Only  . Other Other (See Comments)    Leg cramps GI upset  . Salsalate Other (See Comments)  . Sulfa Antibiotics     Pt doesn't remember  . Penicillins Itching    Has patient had a PCN reaction causing immediate rash, facial/tongue/throat swelling, SOB or lightheadedness with hypotension: Yes Has patient had a PCN reaction causing severe rash involving mucus membranes or skin necrosis: No Has patient had a PCN reaction that required hospitalization: No Has patient had a PCN reaction occurring within the last 10 years: No If all of the above answers are "NO", then may proceed with Cephalosporin use.    REVIEW OF SYSTEMS:   Review of Systems  Constitutional: Positive for malaise/fatigue. Negative for chills and fever.  HENT: Negative for sore throat.   Eyes: Negative for blurred vision, double vision and pain.  Respiratory: Negative for cough, hemoptysis, shortness of breath and wheezing.   Cardiovascular: Positive for leg swelling. Negative for chest pain, palpitations and orthopnea.  Gastrointestinal: Negative for abdominal pain, constipation, diarrhea, heartburn, nausea and vomiting.  Genitourinary: Negative for dysuria and hematuria.  Musculoskeletal: Positive for joint pain. Negative for back pain.  Skin: Negative for rash.  Neurological: Positive for weakness.  Negative for sensory change, speech change, focal weakness and headaches.  Endo/Heme/Allergies: Does not bruise/bleed easily.  Psychiatric/Behavioral: Negative for depression. The patient is not nervous/anxious.     MEDICATIONS AT HOME:   Prior to Admission medications   Medication Sig Start Date End Date Taking? Authorizing Provider  acetaminophen (TYLENOL) 650 MG CR tablet Take 1,300 mg by mouth 2 (two) times daily.   Yes [provider]  amLODipine (NORVASC) 10 MG tablet Take 1 tablet  by mouth daily. 10/29/15  Yes [provider]  Ascorbic Acid (VITAMIN C) 1000 MG tablet Take 1,000 mg by mouth daily.   Yes [provider]  aspirin EC 81 MG tablet Take 81 mg by mouth daily.   Yes [provider]  cefpodoxime (VANTIN) 100 MG tablet Take 2 tablets (200 mg total) by mouth 2 (two) times daily. 03/20/17  Yes Carrie Mew, MD  folic acid (FOLVITE) 1 MG tablet Take 1 mg by mouth daily.   Yes [provider]  isosorbide mononitrate (IMDUR) 30 MG 24 hr tablet Take 30 mg by mouth daily.   Yes [provider]  losartan (COZAAR) 100 MG tablet Take 100 mg by mouth daily.   Yes [provider]  metoprolol (LOPRESSOR) 100 MG tablet Take 100 mg by mouth 2 (two) times daily.   Yes [provider]  Multiple Vitamin (MULTI-VITAMINS) TABS Take 1 tablet by mouth daily.   Yes [provider]  sertraline (ZOLOFT) 100 MG tablet Take 100 mg by mouth daily.   Yes [provider]  traMADol (ULTRAM) 50 MG tablet Take 50 mg by mouth every 6 (six) hours as needed.   Yes [provider]  traZODone (DESYREL) 50 MG tablet Take 50 mg by mouth at bedtime.   Yes [provider]  isosorbide mononitrate (IMDUR) 60 MG 24 hr tablet Take 1 tablet (60 mg total) by mouth daily. Patient not taking: Reported on 06/23/2016 11/11/15   [name], MD  methotrexate (RHEUMATREX) 2.5 MG tablet Take 6 tablets by mouth every Friday.  10/28/15   [provider]  NEOMYCIN-POLYMYXIN-HYDROCORTISONE (CORTISPORIN) 1 % SOLN otic solution Apply 1-2 drops to toe BID after soaking Patient not taking: Reported on 03/19/2017 07/03/16   Hyatt, Max T, DPM  oxyCODONE (OXY IR/ROXICODONE) 5 MG immediate release tablet Take 1 tablet (5 mg total) by mouth every 4 (four) hours as needed for severe pain. Patient not taking: Reported on 03/26/2017 03/22/17   Toni Arthurs, NP  oxyCODONE-acetaminophen (PERCOCET) 5-325 MG tablet Take 1 tablet by mouth  every 4 (four) hours as needed for severe pain. Patient not taking: Reported on 03/26/2017 03/20/17   Rudene Re, MD     VITAL SIGNS:  Blood pressure (!) 150/57, pulse 65, temperature 98.3 F (36.8 C), temperature source Oral, resp. rate 17, height 5' 3.5" (1.613 m), weight 66.7 kg (147 lb), SpO2 94 %.  PHYSICAL EXAMINATION:  Physical Exam  GENERAL:  84 y.o.-year-old patient lying in the bed with no acute distress.  EYES: Pupils equal, round, reactive to light and accommodation. No scleral icterus. Extraocular muscles intact.  HEENT: Head atraumatic, normocephalic. Oropharynx and nasopharynx clear. No oropharyngeal erythema, moist oral mucosa  NECK:  Supple, no jugular venous distention. No thyroid enlargement, no tenderness.  LUNGS: Normal breath sounds bilaterally, no wheezing, rales, rhonchi. No use of accessory muscles of respiration.  CARDIOVASCULAR: S1, S2 normal. No murmurs, rubs, or gallops.  ABDOMEN: Soft, nontender, nondistended. Bowel sounds present. No organomegaly or mass.  EXTREMITIES: No pedal edema, cyanosis, or clubbing. + 2 pedal & radial pulses b/l.   NEUROLOGIC: Cranial nerves II through XII are intact. No focal Motor or sensory deficits appreciated b/l PSYCHIATRIC: The patient is alert and oriented x 3. Good affect.  SKIN:  Right foot redness and swelling extending over her ankle.  Suture line with no discharge or fluctuance.  LABORATORY PANEL:   CBC Recent Labs  Lab 03/26/17 1151  WBC 10.9  HGB 12.5  HCT 37.1  PLT 270   ------------------------------------------------------------------------------------------------------------------  Chemistries  Recent Labs  Lab 03/22/17 0600  NA 137  K 4.2  CL 103  CO2 24  GLUCOSE 94  BUN 17  CREATININE 0.72  CALCIUM 9.0  AST 27  ALT 17  ALKPHOS 63  BILITOT 1.1   ------------------------------------------------------------------------------------------------------------------  Cardiac Enzymes No  results for input(s): TROPONINI in the last 168 hours. ------------------------------------------------------------------------------------------------------------------  RADIOLOGY:  Dg Foot 2 Views Right  Result Date: 03/26/2017 CLINICAL DATA:  History of a fall 8 days ago resulting in lacerations over the dorsum of the foot requiring stitches. Patient also had an acute fracture of the base of the proximal phalanx of the great toe. The patient now is complaining of increased foot pain, erythema, and swelling. Clinical suspicion of cellulitis. EXAM: RIGHT FOOT - 2 VIEW COMPARISON:  Right foot series of March 18, 2017 FINDINGS: The displaced fracture through the lateral aspect of the base of the fifth metatarsal is again demonstrated and appears stable. The second metatarsal neck fracture is not well demonstrated. The other phalanges and metatarsals are intact. The tarsal bones exhibit no acute abnormalities. There is mild soft tissue swelling over the forefoot and midfoot. No soft tissue gas collections or foreign bodies are observed. IMPRESSION: Mild soft tissue swelling may reflect cellulitis. No plain film evidence of osteomyelitis. There are post traumatic changes of the base of the proximal phalanx of the great toe and possibly of the neck of the second metatarsal as described. Electronically Signed   By: David  [place]ique M.D.   On: 03/26/2017 12:37     IMPRESSION AND PLAN:   *Right foot cellulitis.  No history of MRSA.  We will start on cefazolin.  Blood culture sent from the emergency room.  No open wounds for culture. Sepsis present on admission with fever and leukocytosis.  Lactic acid normal. Received IV fluids in the emergency room.  *Hypertension.  Continue home medications.  Add IV hydralazine. Blood pressure elevated at 172/70 on presentation.  Likely due to pain.  *DVT prophylaxis with Lovenox  All the records are reviewed and case discussed with ED provider. Management plans  discussed with the patient, family and they are in agreement.  CODE STATUS: DO NOT RESUSCITATE  TOTAL TIME TAKING CARE OF THIS PATIENT: 40 minutes.   Leia Alf Emanuelle Bastos M.D on 03/26/2017 at 1:21 PM  Between 7am to 6pm - Pager - [phone]  After 6pm go to [email or web address] - password [email or web address]  Office  [phone]  CC: Primary care physician; Idelle Crouch, MD  Note: This dictation was prepared with Dragon dictation along with smaller phrase technology. Any transcriptional errors that result from this process are unintentional.

## 2017-03-26 NOTE — ED Triage Notes (Signed)
Pt presents to ED via [facility] with c/o R foot pain, redness and swelling. Per EMS pt was seen on 1/20 for fall, and lacerations to top of R foot and had 2 stitches placed. Per EMS, they were unable to palpate pedal pulses, pt ahs delay cap refill > 6 seconds.

## 2017-03-26 NOTE — ED Notes (Signed)
Pt placed on 2L via [place] due to received Fentanyl. X-ray at bedside to perform X-ray of foot.

## 2017-03-26 NOTE — Clinical Social Work Note (Signed)
Clinical Social Work Assessment  Patient Details  Name: [name] [name] MRN: [id number] Date of Birth: 07-09-1930  Date of referral:  03/26/17               Reason for consult:  Other (Comment Required)([facility]. )                Permission sought to share information with:  Chartered certified accountant granted to share information::  Yes, Verbal Permission Granted  Name::      Geophysicist/field seismologist::   [place]   Relationship::     Contact Information:     Housing/Transportation Living arrangements for the past 2 months:  [place] of Information:  Patient, Siblings Patient Interpreter Needed:  None Criminal Activity/Legal Involvement Pertinent to Current Situation/Hospitalization:  No - Comment as needed Significant Relationships:  Siblings Lives with:  Self Do you feel safe going back to the place where you live?  Yes Need for family participation in patient care:  Yes (Comment)  Care giving concerns:  Patient is from [facility], where she is at for short term rehab.    Social Worker assessment / plan:  Holiday representative (CSW) reviewed chart and noted that patient is from [facility] Per [facility] admissions coordinator at [facility] patient is a short term rehab resident and has been there for about 1 week. Per [name] [name] patient can come back to [facility] if [facility] SNF authorization is received. CSW briefly met with patient and made her aware of above. Patient is agreeable to return to [facility]. CSW contacted patient's sister/ HPOA [name] [name]. Per [name] [name] she is patient's primary contact and believes that [facility] discharged patient too early last week because she "has a fever." Sister would like for patient to stay in the hospital a few days to get better and then discharge back to [facility]. CSW explained to sister that a new Health Team authorization will be needed. Sister verbalized her  understanding. CSW will continue to follow and assist as needed.   Employment status:  Disabled (Comment on whether or not currently receiving Disability), Retired Nurse, adult PT Recommendations:  Not assessed at this time Information / Referral to community resources:  [place]  Patient/Family's Response to care:  Patient and her sister are agreeable for patient to return to [facility] [employer]   Patient/Family's Understanding of and Emotional Response to Diagnosis, Current Treatment, and Prognosis:  Patient and her sister were very pleasant and thanked CSW for assistance.   Emotional Assessment Appearance:  Appears stated age Attitude/Demeanor/Rapport:    Affect (typically observed):  Accepting, Adaptable, Pleasant Orientation:  Oriented to Self, Oriented to Place, Oriented to  Time, Oriented to Situation Alcohol / Substance use:  Not Applicable Psych involvement (Current and /or in the community):  No (Comment)  Discharge Needs  Concerns to be addressed:  Discharge Planning Concerns Readmission within the last 30 days:  No Current discharge risk:  Dependent with Mobility Barriers to Discharge:  Continued Medical Work up   [employer], [name] Beets, LCSW 03/26/2017, 4:35 PM
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
## 2017-03-26 NOTE — Progress Notes (Addendum)
Only itching with penicillin. Can take cafazolin. Recently was on Cefpodoxime without any allergy

## 2017-03-26 NOTE — NC FL2 (Signed)
[place] LEVEL OF CARE SCREENING TOOL     IDENTIFICATION  Patient Name: [name] [name] Birthdate: [birth year] Sex: female Admission Date (Current Location): 03/26/2017  [place] and [place] Number:  Engineering geologist and Address:  [facility], [address] [place], [place] [address]      Provider Number: [id number]  Attending Physician Name and Address:  Hillary Bow, MD  Relative Name and Phone Number:       Current Level of Care: Hospital Recommended Level of Care: [place] Prior Approval Number:    Date Approved/Denied:   PASRR Number: ([id number] )  Discharge Plan: SNF    Current Diagnoses: Patient Active Problem List   Diagnosis Date Noted  . Ankle fracture, right, closed, with routine healing, subsequent encounter 03/26/2017  . Cellulitis of right foot 03/26/2017  . Osteoarthritis 07/03/2016  . Rheumatoid arthritis without elevated rheumatoid factor ([place]) 07/03/2016  . Pulmonary HTN ([place]) 06/19/2016  . Chest pain 11/09/2015  . GERD (gastroesophageal reflux disease) 10/06/2013  . HTN (hypertension) 10/06/2013  . Hyperlipidemia, unspecified 10/06/2013  . Osteoporosis 10/06/2013  . Lumbar radiculitis 07/08/2013  . Lumbar spondylosis 07/08/2013  . Lumbar stenosis with neurogenic claudication 07/08/2013    Orientation RESPIRATION BLADDER Height & Weight     Self, Time, Situation, Place  Normal Continent Weight: 147 lb (66.7 kg) Height:  5' 3.5" (161.3 cm)  BEHAVIORAL SYMPTOMS/MOOD NEUROLOGICAL BOWEL NUTRITION STATUS      Continent Diet(Regular Diet )  AMBULATORY STATUS COMMUNICATION OF NEEDS Skin   Extensive Assist Verbally Normal                       Personal Care Assistance Level of Assistance  Bathing, Feeding, Dressing Bathing Assistance: Limited assistance Feeding assistance: Independent Dressing Assistance: Limited assistance     Functional Limitations Info  Sight, Hearing,  Speech Sight Info: Adequate Hearing Info: Adequate Speech Info: Adequate    SPECIAL CARE FACTORS FREQUENCY  PT (By licensed PT), OT (By licensed OT)     PT Frequency: (5) OT Frequency: (5)            Contractures      Additional Factors Info  Code Status, Allergies Code Status Info: (DNR ) Allergies Info: (Atorvastatin, Bisphosphonates, Codeine, Memantine, Omeprazole, Other, Salsalate, Sulfa Antibiotics, Penicillins)           Current Medications (03/26/2017):  This is the current hospital active medication list Current Facility-Administered Medications  Medication Dose Route Frequency Provider Last Rate Last Dose  . acetaminophen (TYLENOL) tablet 650 mg  650 mg Oral Q6H PRN Hillary Bow, MD       Or  . acetaminophen (TYLENOL) suppository 650 mg  650 mg Rectal Q6H PRN Sudini, Srikar, MD      . albuterol (PROVENTIL) (2.5 MG/3ML) 0.083% nebulizer solution 2.5 mg  2.5 mg Nebulization Q2H PRN Sudini, Srikar, MD      . ceFAZolin (ANCEF) 1 g in dextrose 5 % 50 mL IVPB  1 g Intravenous Q8H Hillary Bow, MD   Stopped at 03/26/17 1548  . docusate sodium (COLACE) capsule 100 mg  100 mg Oral BID Sudini, Srikar, MD      . enoxaparin (LOVENOX) injection 40 mg  40 mg Subcutaneous Q24H Sudini, Srikar, MD      . ondansetron (ZOFRAN) tablet 4 mg  4 mg Oral Q6H PRN Sudini, Alveta Heimlich, MD       Or  . ondansetron (ZOFRAN) injection 4 mg  4 mg Intravenous Q6H PRN Sudini, Srikar, MD      . polyethylene glycol (MIRALAX / GLYCOLAX) packet 17 g  17 g Oral Daily PRN Sudini, Alveta Heimlich, MD      . traMADol Veatrice Bourbon) tablet 50 mg  50 mg Oral Q6H PRN Hillary Bow, MD         Discharge Medications: Please see discharge summary for a list of discharge medications.  Relevant Imaging Results:  Relevant Lab Results:   Additional Information (SSN: [id number])  [place] Huberty, Veronia Beets, LCSW

## 2017-03-26 NOTE — ED Provider Notes (Signed)
[facility] Emergency Department Provider Note  ____________________________________________  Time seen: Approximately 11:24 AM  I have reviewed the triage vital signs and the nursing notes.   HISTORY  Chief Complaint Foot Pain    HPI [name] [name] is a 84 y.o. female seen 03/20/17 for a fall and discharged after findings of facial contusion, first and second metatarsal fractures on the right foot with repair of superficial laceration on the dorsum of the foot, Cefpodoxime for UTI, presenting with right lower extremity pain, erythema, swelling and warmth.  The patient has had a progressively worsening evaluation of her right foot without any systemic symptoms she denies any fever or chills, nausea or vomiting, diarrhea.  She has not been having any calf pain, chest pain or shortness of breath.  She has had no changes in her mental status.  She has not been given anything for her pain at her NH.  Past Medical History:  Diagnosis Date  . breast cancer   . Hypertension     Patient Active Problem List   Diagnosis Date Noted  . Ankle fracture, right, closed, with routine healing, subsequent encounter 03/26/2017  . Osteoarthritis 07/03/2016  . Rheumatoid arthritis without elevated rheumatoid factor ([place]) 07/03/2016  . Pulmonary HTN ([place]) 06/19/2016  . Chest pain 11/09/2015  . GERD (gastroesophageal reflux disease) 10/06/2013  . HTN (hypertension) 10/06/2013  . Hyperlipidemia, unspecified 10/06/2013  . Osteoporosis 10/06/2013  . Lumbar radiculitis 07/08/2013  . Lumbar spondylosis 07/08/2013  . Lumbar stenosis with neurogenic claudication 07/08/2013    Past Surgical History:  Procedure Laterality Date  . ABDOMINAL HYSTERECTOMY    . CHOLECYSTECTOMY    . MASTECTOMY    . VASCULAR SURGERY      Current Outpatient Rx  . Order #: [id number] Class: Historical Med  . Order #: [id number] Class: Historical Med  . Order #: [id number] Class: Historical Med  . Order  #: [id number] Class: Historical Med  . Order #: [id number] Class: Print  . Order #: [id number] Class: Historical Med  . Order #: [id number] Class: Historical Med  . Order #: [id number] Class: Historical Med  . Order #: [id number] Class: Historical Med  . Order #: [id number] Class: Historical Med  . Order #: [id number] Class: Historical Med  . Order #: [id number] Class: Historical Med  . Order #: [id number] Class: Historical Med  . Order #: [id number] Class: Normal  . Order #: [id number] Class: Historical Med  . Order #: [id number] Class: Normal  . Order #: [id number] Class: No Print  . Order #: [id number] Class: Print    Allergies Atorvastatin; Bisphosphonates; Codeine; Memantine; Omeprazole; Other; Salsalate; Sulfa antibiotics; and Penicillins  Family History  Problem Relation Age of Onset  . Heart failure Mother   . Cancer Father   . Heart attack Sister     Social History Social History   Tobacco Use  . Smoking status: Never Smoker  . Smokeless tobacco: Never Used  Substance Use Topics  . Alcohol use: No  . Drug use: No    Review of Systems Constitutional: No fever/chills.  No lightheadedness or syncope.  No general malaise. Eyes: No visual changes. ENT: No sore throat. No congestion or rhinorrhea.  Positive right orbital contusion, right forehead laceration status post stitches. Cardiovascular: Denies chest pain. Denies palpitations. Respiratory: Denies shortness of breath.  No cough. Gastrointestinal: No abdominal pain.  No nausea, no vomiting.  No diarrhea.  No constipation. Genitourinary: Negative for dysuria. Musculoskeletal: Negative for back pain.  Positive right lower extremity contusions, abrasions, laceration repair, now with overlying erythema, swelling and warmth. Skin: Positive for rash.  Neurological: Negative for headaches. No focal numbness, tingling or weakness.     ____________________________________________   PHYSICAL EXAM:  VITAL SIGNS: ED Triage Vitals  Enc Vitals  Group     BP      Pulse      Resp      Temp      Temp src      SpO2      Weight      Height      Head Circumference      Peak Flow      Pain Score      Pain Loc      Pain Edu?      Excl. in [place]?     Constitutional: Alert and oriented x3.  Chronically ill-appearing and uncomfortable appearing but nontoxic. Answers questions appropriately. Eyes: Conjunctivae are normal.  EOMI. No scleral icterus. Head: Atraumatic. Nose: No congestion/rhinnorhea. Mouth/Throat: Mucous membranes are mildly dry.  Neck: No stridor.  Supple.  No meningismus. Cardiovascular: Normal rate, regular rhythm. No murmurs, rubs or gallops.  Respiratory: Normal respiratory effort.  No accessory muscle use or retractions. Lungs CTAB.  No wheezes, rales or ronchi. Gastrointestinal: Soft, nontender and nondistended.  No guarding or rebound.  No peritoneal signs. Musculoskeletal: On the right foot, the patient has multiple scabbed wounds, as well as a 2cm stitched laceration repair on the dorsum of the foot.  The patient has significant tenderness to palpation, as well as erythema in the entirety of the right lateral foot and lymphatic tracking up to the mid tibial shaft.  She does not have palpable pulses, but does have dopplerable pulses DP and PT.  I do not see any evidence of abscess or fluctuance superficially. Neurologic:  A&Ox3.  Speech is clear.  Face and smile are symmetric.  EOMI.  Moves all extremities well. Skin:  Skin is warm, dry and intact. No rash noted. Psychiatric: Mood and affect are normal. Speech and behavior are normal.  Normal judgement.  ____________________________________________   LABS (all labs ordered are listed, but only abnormal results are displayed)  Labs Reviewed  CBC - Abnormal; Notable for the following components:      Result Value   RBC 3.12 (*)    MCV 118.9 (*)    MCH 40.1 (*)    RDW 16.2 (*)    All other components within normal limits  CULTURE, BLOOD (ROUTINE X 2)   CULTURE, BLOOD (ROUTINE X 2)  URINALYSIS, COMPLETE (UACMP) WITH MICROSCOPIC  LACTIC ACID, PLASMA  LACTIC ACID, PLASMA  SEDIMENTATION RATE  BASIC METABOLIC PANEL   ____________________________________________  EKG  ED ECG REPORT I, Eula Listen, the attending physician, personally viewed and interpreted this ECG.   Date: 03/26/2017  EKG Time: 1256  Rate: 67  Rhythm: normal sinus rhythm  Axis: leftward  Intervals:none  ST&T Change: No STEMI  ____________________________________________  RADIOLOGY  Dg Foot 2 Views Right  Result Date: 03/26/2017 CLINICAL DATA:  History of a fall 8 days ago resulting in lacerations over the dorsum of the foot requiring stitches. Patient also had an acute fracture of the base of the proximal phalanx of the great toe. The patient now is complaining of increased foot pain, erythema, and swelling. Clinical suspicion of cellulitis. EXAM: RIGHT FOOT - 2 VIEW COMPARISON:  Right foot series of March 18, 2017 FINDINGS: The displaced fracture through the lateral aspect of the base of the fifth metatarsal is again demonstrated and appears stable. The second metatarsal neck fracture is not well  demonstrated. The other phalanges and metatarsals are intact. The tarsal bones exhibit no acute abnormalities. There is mild soft tissue swelling over the forefoot and midfoot. No soft tissue gas collections or foreign bodies are observed. IMPRESSION: Mild soft tissue swelling may reflect cellulitis. No plain film evidence of osteomyelitis. There are post traumatic changes of the base of the proximal phalanx of the great toe and possibly of the neck of the second metatarsal as described. Electronically Signed   By: David  Martinique M.D.   On: 03/26/2017 12:37    ____________________________________________   PROCEDURES  Procedure(s) performed: None  Procedures  Critical Care performed: No ____________________________________________   INITIAL IMPRESSION /  ASSESSMENT AND PLAN / ED COURSE  Pertinent labs & imaging results that were available during my care of the patient were reviewed by me and considered in my medical decision making (see chart for details).  84 y.o. female with recent fall, multiple abrasions to the right foot now presenting with right lower extremity erythema, tenderness to palpation and warmth.  Overall, the patient is hemodynamically stable and does not have any evidence of systemic illness.  However, I am concerned about severe cellulitis.  In addition she has multiple open wounds, so underlying abscess is not excluded. DVT is possible but much less likely. We will start with basic laboratory studies, right foot x-ray, blood cultures, urine test, and initiation of immediate antibiotics and intravenous fluids.  The patient will require admission for continued evaluation and treatment.  I have reviewed the patient's medical chart.  ----------------------------------------- 12:51 PM on 03/26/2017 -----------------------------------------  The patient's x-ray does not show any evidence of osteomyelitis and is consistent with her previous fractures as well as overlying soft tissue swelling consistent with cellulitis.  Her white blood cell count today is 10.9.  She has received her first choice of antibiotics.  At this time, we will plan to admit the patient for continued evaluation and treatment.  ____________________________________________  FINAL CLINICAL IMPRESSION(S) / ED DIAGNOSES  Final diagnoses:  Cellulitis of right lower extremity         NEW MEDICATIONS STARTED DURING THIS VISIT:  New Prescriptions   No medications on file      Eula Listen, MD 03/26/17 1320

## 2017-03-27 LAB — CBC
HEMATOCRIT: 36.7 % (ref 35.0–47.0)
Hemoglobin: 12.4 g/dL (ref 12.0–16.0)
MCH: 40.2 pg — AB (ref 26.0–34.0)
MCHC: 33.7 g/dL (ref 32.0–36.0)
MCV: 119.2 fL — ABNORMAL HIGH (ref 80.0–100.0)
PLATELETS: 280 10*3/uL (ref 150–440)
RBC: 3.08 MIL/uL — AB (ref 3.80–5.20)
RDW: 15.8 % — ABNORMAL HIGH (ref 11.5–14.5)
WBC: 11.5 10*3/uL — ABNORMAL HIGH (ref 3.6–11.0)

## 2017-03-27 LAB — BASIC METABOLIC PANEL
Anion gap: 7 (ref 5–15)
BUN: 14 mg/dL (ref 6–20)
CHLORIDE: 104 mmol/L (ref 101–111)
CO2: 24 mmol/L (ref 22–32)
CREATININE: 0.46 mg/dL (ref 0.44–1.00)
Calcium: 8.2 mg/dL — ABNORMAL LOW (ref 8.9–10.3)
GFR calc Af Amer: >60 mL/min (ref >60)
GFR calc non Af Amer: >60 mL/min (ref >60)
Glucose, Bld: 116 mg/dL — ABNORMAL HIGH (ref 65–99)
Potassium: 3.9 mmol/L (ref 3.5–5.1)
Sodium: 135 mmol/L (ref 135–145)

## 2017-03-27 NOTE — Plan of Care (Signed)
  Education: Knowledge of General Education information will improve 03/27/2017 0559 - Progressing by Clemens Lachman, Lucille Passy, RN   Health Behavior/Discharge Planning: Ability to manage health-related needs will improve 03/27/2017 0559 - Progressing by Ramesha Poster, Lucille Passy, RN   Clinical Measurements: Ability to maintain clinical measurements within normal limits will improve 03/27/2017 0559 - Progressing by Laelynn Blizzard, Lucille Passy, RN Will remain free from infection 03/27/2017 0559 - Progressing by Briannie Gutierrez, Lucille Passy, RN Diagnostic test results will improve 03/27/2017 0559 - Progressing by Jaymes Revels, Lucille Passy, RN Respiratory complications will improve 03/27/2017 0559 - Progressing by Bryna Colander, RN Cardiovascular complication will be avoided 03/27/2017 0559 - Progressing by Sarya Linenberger, Lucille Passy, RN   Nutrition: Adequate nutrition will be maintained 03/27/2017 0559 - Progressing by Bryna Colander, RN   Activity: Risk for activity intolerance will decrease 03/27/2017 0559 - Progressing by Bryna Colander, RN   Coping: Level of anxiety will decrease 03/27/2017 0559 - Progressing by Araceli Coufal, Lucille Passy, RN

## 2017-03-27 NOTE — Progress Notes (Signed)
Clinical Education officer, museum (CSW) notified Health Team case manager that plan is for patient to D/C back to [facility]. Per Health Team case manager they will have to review PT notes for a new authorization. PT is pending.   [employer], LCSW [phone]

## 2017-03-27 NOTE — Evaluation (Signed)
Physical Therapy Evaluation Patient Details Name: [name] [name] MRN: [id number] DOB: 07/09/1930 Today's Date: 03/27/2017   History of Present Illness  Pt admitted R foot cellulitis. Pt with history of OA, RA, pulmonary HTN, chest pain, GERD, HTN. Pt has been at SNF since previous admission for fall off bus step.  Clinical Impression  Pt is a pleasant 84 year old female who was admitted for R foot cellulitis. Pt recently admitted for R foot fracture following fall. Pt performs bed mobility with cga, transfers with mod assist, and ambulation with mod assist and RW. Pt with severe pain on R ant. Foot, unable to don sock for WBing, chooses NWB for all mobility. Pt very red/swollen. Once in recliner, elevated on two pillows. Pt demonstrates deficits with strength/mobility/pain. Would benefit from skilled PT to address above deficits and promote optimal return to PLOF; recommend return to STR upon discharge from acute hospitalization.       Follow Up Recommendations SNF    Equipment Recommendations  None recommended by PT    Recommendations for Other Services       Precautions / Restrictions Precautions Precautions: Fall Restrictions Weight Bearing Restrictions: No RLE Weight Bearing: Weight bearing as tolerated Other Position/Activity Restrictions: needs surgical shoe      Mobility  Bed Mobility Overal bed mobility: Needs Assistance Bed Mobility: Supine to Sit     Supine to sit: Min guard     General bed mobility comments: safe technique with use of railing noted  Transfers Overall transfer level: Needs assistance Equipment used: Rolling walker (2 wheeled) Transfers: Sit to/from Stand Sit to Stand: Mod assist         General transfer comment: needs cues for L knee extension. Pt chooses NWB for R foot. Fatigues quickly with exertion  Ambulation/Gait Ambulation/Gait assistance: Mod assist Ambulation Distance (Feet): 6 Feet Assistive device: Rolling walker (2  wheeled) Gait Pattern/deviations: Step-to pattern     General Gait Details: Pt able to initiate hopping on R foot up side of bed. Pt with LOB and needed to sit on bed. Pt then able to hop from bed->recliner with mod assist. Tries to sit back down on recliner before it was safe, cues given. Fatigues quickly  Financial trader Rankin (Stroke Patients Only)       Balance Overall balance assessment: Needs assistance Sitting-balance support: Feet supported;No upper extremity supported Sitting balance-Leahy Scale: Good     Standing balance support: Bilateral upper extremity supported Standing balance-Leahy Scale: Poor                               Pertinent Vitals/Pain Pain Assessment: Faces Faces Pain Scale: Hurts little more Pain Location: R foot and R hand/wrist Pain Descriptors / Indicators: Aching;Sore Pain Intervention(s): Limited activity within patient's tolerance;Repositioned    Home Living Family/patient expects to be discharged to:: Skilled nursing facility Living Arrangements: Alone Available Help at Discharge: Friend(s);Available PRN/intermittently Type of Home: House Home Access: Stairs to enter Entrance Stairs-Rails: Psychiatric nurse of Steps: 2 Home Layout: One level Home Equipment: Walker - 2 wheels;Bedside commode      Prior Function           Comments: previously independent, however recently needing assist for all mobility and using RW at SNF     Hand Dominance        Extremity/Trunk Assessment  Upper Extremity Assessment Upper Extremity Assessment: Generalized weakness(B UE grossly 4/5)    Lower Extremity Assessment Lower Extremity Assessment: Generalized weakness(R LE grossly 3/5; L LE grossly 4+/5)       Communication   Communication: No difficulties  Cognition Arousal/Alertness: Awake/alert Behavior During Therapy: WFL for tasks assessed/performed Overall  Cognitive Status: Within Functional Limits for tasks assessed                                        General Comments      Exercises     Assessment/Plan    PT Assessment Patient needs continued PT services  PT Problem List Decreased strength;Decreased activity tolerance;Decreased balance;Decreased mobility;Decreased knowledge of use of DME       PT Treatment Interventions DME instruction;Gait training;Stair training;Functional mobility training;Neuromuscular re-education;Therapeutic exercise;Therapeutic activities;Balance training;Patient/family education    PT Goals (Current goals can be found in the Care Plan section)  Acute Rehab PT Goals Patient Stated Goal: To be stronger and walk better PT Goal Formulation: With patient Time For Goal Achievement: 04/10/17 Potential to Achieve Goals: Good    Frequency Min 2X/week   Barriers to discharge Inaccessible home environment;Decreased caregiver support      Co-evaluation               AM-PAC PT "6 Clicks" Daily Activity  Outcome Measure Difficulty turning over in bed (including adjusting bedclothes, sheets and blankets)?: Unable Difficulty moving from lying on back to sitting on the side of the bed? : Unable Difficulty sitting down on and standing up from a chair with arms (e.g., wheelchair, bedside commode, etc,.)?: Unable Help needed moving to and from a bed to chair (including a wheelchair)?: A Lot Help needed walking in hospital room?: A Lot Help needed climbing 3-5 steps with a railing? : Total 6 Click Score: 8    End of Session Equipment Utilized During Treatment: Gait belt Activity Tolerance: Patient limited by fatigue Patient left: in chair;with chair alarm set Nurse Communication: Mobility status PT Visit Diagnosis: Unsteadiness on feet (R26.81);Difficulty in walking, not elsewhere classified (R26.2);Muscle weakness (generalized) (M62.81)    Time: 1548-1600 PT Time Calculation (min)  (ACUTE ONLY): 12 min   Charges:   PT Evaluation $PT Eval Low Complexity: 1 Low     PT G CodesGreggory [name], PT, DPT [phone]   Daquane Aguilar 03/27/2017, 5:01 PM

## 2017-03-27 NOTE — Progress Notes (Signed)
[facility] [place] NAME: [name] [name]    MR#:  [id number]  DATE OF BIRTH:  [birth year]  SUBJECTIVE:  CHIEF COMPLAINT:   Chief Complaint  Patient presents with  . Foot Pain   Still right foot swelling, redness and tenderness. REVIEW OF SYSTEMS:  Review of Systems  Constitutional: Negative for chills, fever and malaise/fatigue.  HENT: Negative for sore throat.   Eyes: Negative for blurred vision and double vision.  Respiratory: Negative for cough, hemoptysis, shortness of breath, wheezing and stridor.   Cardiovascular: Negative for chest pain, palpitations, orthopnea and leg swelling.  Gastrointestinal: Negative for abdominal pain, blood in stool, diarrhea, melena, nausea and vomiting.  Genitourinary: Negative for dysuria, flank pain and hematuria.  Musculoskeletal: Negative for back pain and joint pain.       Right foot swelling, redness and tenderness.  Skin: Negative for rash.  Neurological: Negative for dizziness, sensory change, focal weakness, seizures, loss of consciousness, weakness and headaches.  Endo/Heme/Allergies: Negative for polydipsia.  Psychiatric/Behavioral: Negative for depression. The patient is not nervous/anxious.     DRUG ALLERGIES:   Allergies  Allergen Reactions  . Atorvastatin Other (See Comments)    Myositis  . Bisphosphonates Other (See Comments)    GI upset  . Codeine Other (See Comments)  . Memantine Other (See Comments)  . Omeprazole Nausea Only  . Other Other (See Comments)    Leg cramps GI upset  . Salsalate Other (See Comments)  . Sulfa Antibiotics     Pt doesn't remember  . Penicillins Itching    Has patient had a PCN reaction causing immediate rash, facial/tongue/throat swelling, SOB or lightheadedness with hypotension: Yes Has patient had a PCN reaction causing severe rash involving mucus membranes or skin necrosis: No Has patient had a PCN reaction that required hospitalization:  No Has patient had a PCN reaction occurring within the last 10 years: No If all of the above answers are "NO", then may proceed with Cephalosporin use.   VITALS:  Blood pressure (!) 159/57, pulse 83, temperature 98.4 F (36.9 C), temperature source Oral, resp. rate 15, height 5' 3.5" (1.613 m), weight 147 lb 3.2 oz (66.8 kg), SpO2 96 %. PHYSICAL EXAMINATION:  Physical Exam  Constitutional: She is oriented to person, place, and time and well-developed, well-nourished, and in no distress.  HENT:  Head: Normocephalic.  Mouth/Throat: Oropharynx is clear and moist.  Eyes: Conjunctivae and EOM are normal. Pupils are equal, round, and reactive to light. No scleral icterus.  Neck: Normal range of motion. Neck supple. No JVD present. No tracheal deviation present.  Cardiovascular: Normal rate, regular rhythm and normal heart sounds. Exam reveals no gallop.  No murmur heard. Pulmonary/Chest: Effort normal and breath sounds normal. No respiratory distress. She has no wheezes. She has no rales.  Abdominal: Soft. Bowel sounds are normal. She exhibits no distension. There is no tenderness. There is no rebound.  Musculoskeletal: She exhibits no edema or tenderness.  right foot swelling, erythema and tenderness.  Neurological: She is alert and oriented to person, place, and time. No cranial nerve deficit.  Skin: No rash noted. No erythema.  Psychiatric: Affect normal.   LABORATORY PANEL:  Female CBC Recent Labs  Lab 03/27/17 0323  WBC 11.5*  HGB 12.4  HCT 36.7  PLT 280   ------------------------------------------------------------------------------------------------------------------ Chemistries  Recent Labs  Lab 03/22/17 0600  03/27/17 0323  NA 137   < > 135  K 4.2   < >  3.9  CL 103   < > 104  CO2 24   < > 24  GLUCOSE 94   < > 116*  BUN 17   < > 14  CREATININE 0.72   < > 0.46  CALCIUM 9.0   < > 8.2*  AST 27  --   --   ALT 17  --   --   ALKPHOS 63  --   --   BILITOT 1.1  --   --     < > = values in this interval not displayed.   RADIOLOGY:  No results found. ASSESSMENT AND PLAN:   *Right foot cellulitis.  No history of MRSA.   Continue cefazolin.   Sepsis present on admission with fever and leukocytosis.  Lactic acid normal. Received IV fluids in the emergency room.  *Hypertension.  Continue home medications.  IV hydralazine.  All the records are reviewed and case discussed with Care Management/Social Worker. Management plans discussed with the patient, family and they are in agreement.  CODE STATUS: DNR  TOTAL TIME TAKING CARE OF THIS PATIENT: 28 minutes.   More than 50% of the time was spent in counseling/coordination of care: YES  POSSIBLE D/C IN 2 DAYS, DEPENDING ON CLINICAL CONDITION.   Demetrios Loll M.D on 03/27/2017 at 3:47 PM  Between 7am to 6pm - Pager - [phone]  After 6pm go to [email or web address] - [facility]

## 2017-03-28 LAB — GLUCOSE, CAPILLARY: Glucose-Capillary: 95 mg/dL (ref 65–99)

## 2017-03-28 NOTE — Progress Notes (Signed)
Patient pulled out IV and is refusing to have another placed at this time.  Advised about IV antibiotics and still refused. Will try later

## 2017-03-28 NOTE — Progress Notes (Signed)
Health Team case manager has been notified that PT evaluation has been completed.   [employer], LCSW [phone]

## 2017-03-28 NOTE — Progress Notes (Signed)
[name]    MR#:  [id number]  DATE OF BIRTH:  23-Jan-1931  SUBJECTIVE:  CHIEF COMPLAINT:   Chief Complaint  Patient presents with  . Foot Pain   Still has right foot swelling, redness and tenderness. Afebrile  REVIEW OF SYSTEMS:  Review of Systems  Constitutional: Positive for malaise/fatigue. Negative for chills and fever.  HENT: Negative for sore throat.   Eyes: Negative for blurred vision and double vision.  Respiratory: Negative for cough, hemoptysis, shortness of breath, wheezing and stridor.   Cardiovascular: Negative for chest pain, palpitations, orthopnea and leg swelling.  Gastrointestinal: Negative for abdominal pain, blood in stool, diarrhea, melena, nausea and vomiting.  Genitourinary: Negative for dysuria, flank pain and hematuria.  Musculoskeletal: Positive for joint pain. Negative for back pain.       Right foot swelling, redness and tenderness.  Skin: Negative for rash.  Neurological: Positive for weakness. Negative for dizziness, sensory change, focal weakness, seizures, loss of consciousness and headaches.  Endo/Heme/Allergies: Negative for polydipsia.  Psychiatric/Behavioral: Negative for depression. The patient is not nervous/anxious.     DRUG ALLERGIES:   Allergies  Allergen Reactions  . Atorvastatin Other (See Comments)    Myositis  . Bisphosphonates Other (See Comments)    GI upset  . Codeine Other (See Comments)  . Memantine Other (See Comments)  . Omeprazole Nausea Only  . Other Other (See Comments)    Leg cramps GI upset  . Salsalate Other (See Comments)  . Sulfa Antibiotics     Pt doesn't remember  . Penicillins Itching    Has patient had a PCN reaction causing immediate rash, facial/tongue/throat swelling, SOB or lightheadedness with hypotension: Yes Has patient had a PCN reaction causing severe rash involving mucus membranes or skin necrosis: No Has patient  had a PCN reaction that required hospitalization: No Has patient had a PCN reaction occurring within the last 10 years: No If all of the above answers are "NO", then may proceed with Cephalosporin use.   VITALS:  Blood pressure (!) 153/63, pulse 77, temperature 98.9 F (37.2 C), temperature source Oral, resp. rate 18, height 5' 3.5" (1.613 m), weight 65.3 kg (143 lb 15.4 oz), SpO2 96 %. PHYSICAL EXAMINATION:  Physical Exam  Constitutional: She is oriented to person, place, and time and well-developed, well-nourished, and in no distress.  HENT:  Head: Normocephalic.  Mouth/Throat: Oropharynx is clear and moist.  Eyes: Conjunctivae and EOM are normal. Pupils are equal, round, and reactive to light. No scleral icterus.  Neck: Normal range of motion. Neck supple. No JVD present. No tracheal deviation present.  Cardiovascular: Normal rate, regular rhythm and normal heart sounds. Exam reveals no gallop.  No murmur heard. Pulmonary/Chest: Effort normal and breath sounds normal. No respiratory distress. She has no wheezes. She has no rales.  Abdominal: Soft. Bowel sounds are normal. She exhibits no distension. There is no tenderness. There is no rebound.  Musculoskeletal: She exhibits no edema or tenderness.  right foot swelling, erythema and tenderness.  Neurological: She is alert and oriented to person, place, and time. No cranial nerve deficit.  Skin: No rash noted. No erythema.  Psychiatric: Affect normal.   LABORATORY PANEL:  Female CBC Recent Labs  Lab 03/27/17 0323  WBC 11.5*  HGB 12.4  HCT 36.7  PLT 280   ------------------------------------------------------------------------------------------------------------------ Chemistries  Recent Labs  Lab 03/22/17 0600  03/27/17 0323  NA 137   < >  135  K 4.2   < > 3.9  CL 103   < > 104  CO2 24   < > 24  GLUCOSE 94   < > 116*  BUN 17   < > 14  CREATININE 0.72   < > 0.46  CALCIUM 9.0   < > 8.2*  AST 27  --   --   ALT 17  --    --   ALKPHOS 63  --   --   BILITOT 1.1  --   --    < > = values in this interval not displayed.   RADIOLOGY:  No results found. ASSESSMENT AND PLAN:   *Right foot cellulitis.  No history of MRSA.   ON IV cefazolin.   Sepsis present on admission with fever and leukocytosis.  Lactic acid normal. Resolved Cx negative. Afebrile Foot still is red and tender. Continue IV abx inpatient  *Hypertension.  Continue home medications.  IV hydralazine ordered.  All the records are reviewed and case discussed with Care Management/Social Worker. Management plans discussed with the patient, family and they are in agreement.  CODE STATUS: DNR  TOTAL TIME TAKING CARE OF THIS PATIENT: 35 minutes.   POSSIBLE D/C IN 2 DAYS, DEPENDING ON CLINICAL CONDITION.   [name] [name] M.D on 03/28/2017 at 12:37 PM  Between 7am to 6pm - Pager - [phone]  After 6pm go to [email or web address] - [facility]

## 2017-03-29 MED ORDER — VANCOMYCIN HCL IN DEXTROSE 1-5 GM/200ML-% IV SOLN
1000.0000 mg | Freq: Once | INTRAVENOUS | Status: AC
Start: 2017-03-29 — End: 2017-03-29
  Administered 2017-03-29: 1000 mg via INTRAVENOUS
  Filled 2017-03-29: qty 200

## 2017-03-29 MED ORDER — HYDRALAZINE HCL 20 MG/ML IJ SOLN: 10.0000 mg | Freq: Four times a day (QID) | INTRAMUSCULAR | Status: DC | PRN

## 2017-03-29 MED ORDER — POLYETHYLENE GLYCOL 3350 17 G PO PACK
17.0000 g | PACK | Freq: Every day | ORAL | Status: AC
  Administered 2017-03-30 – 2017-03-31 (×2): 17 g via ORAL
  Filled 2017-03-29 (×2): qty 1

## 2017-03-29 MED ORDER — VANCOMYCIN HCL IN DEXTROSE 1-5 GM/200ML-% IV SOLN
1000.0000 mg | INTRAVENOUS | Status: DC
  Administered 2017-03-30 – 2017-04-01 (×3): 1000 mg via INTRAVENOUS
  Filled 2017-03-29 (×4): qty 200

## 2017-03-29 MED ORDER — LOSARTAN POTASSIUM 50 MG PO TABS
100.0000 mg | ORAL_TABLET | Freq: Every day | ORAL | Status: DC
  Administered 2017-03-29 – 2017-04-03 (×5): 100 mg via ORAL
  Filled 2017-03-29 (×6): qty 2

## 2017-03-29 NOTE — Progress Notes (Signed)
Medical director at [facility] Team has denied SNF and offered a peer to peer. MD has agreed to complete a peer to peer and will contact the medical director.   [employer], LCSW [phone]

## 2017-03-29 NOTE — Progress Notes (Signed)
Physical Therapy Treatment Patient Details Name: [name] [name] MRN: [id number] DOB: [birth year] Today's Date: 03/29/2017    History of Present Illness Pt admitted R foot cellulitis. Pt with history of OA, RA, pulmonary HTN, chest pain, GERD, HTN. Pt has been at SNF since previous admission for fall off bus step.    PT Comments    Pt agreeable to transfer to recliner this date. Still limited by severe pain in foot although looks less irritated and red this date compared to previous session. Able to participate in supine there-ex. Pt still maintains NWB during mobility efforts limiting distance. Will continue to progress.  Follow Up Recommendations  SNF     Equipment Recommendations  None recommended by PT    Recommendations for Other Services       Precautions / Restrictions Precautions Precautions: Fall Restrictions Weight Bearing Restrictions: No RLE Weight Bearing: Weight bearing as tolerated Other Position/Activity Restrictions: needs surgical shoe    Mobility  Bed Mobility Overal bed mobility: Needs Assistance Bed Mobility: Supine to Sit     Supine to sit: Min guard     General bed mobility comments: slow transfer, however safe technique with use of railing.   Transfers Overall transfer level: Needs assistance Equipment used: Rolling walker (2 wheeled) Transfers: Sit to/from Stand Sit to Stand: Mod assist         General transfer comment: standing with upright posture and RW. No post op shoe in room, therefore pt NWB on R LE  Ambulation/Gait Ambulation/Gait assistance: Mod assist Ambulation Distance (Feet): 2 Feet Assistive device: Rolling walker (2 wheeled) Gait Pattern/deviations: Step-to pattern     General Gait Details: Able to initiate hopping on L LE to recliner. RW used with antalgic pattern. Complete NWB on R LE. Tries to sit prior to cues from therapist.   Stairs            Wheelchair Mobility    Modified Rankin (Stroke Patients  Only)       Balance                                            Cognition Arousal/Alertness: Awake/alert Behavior During Therapy: WFL for tasks assessed/performed Overall Cognitive Status: Within Functional Limits for tasks assessed                                        Exercises Other Exercises Other Exercises: supine ther-ex performed on B LE including quad sets, SLRs, hip abd/add, knee flexion, and glut squeezes. ALl ther-ex performed x 12 reps with supervision    General Comments        Pertinent Vitals/Pain Pain Assessment: Faces Faces Pain Scale: Hurts little more Pain Location: R foot Pain Descriptors / Indicators: Aching;Sore Pain Intervention(s): Limited activity within patient's tolerance;RN gave pain meds during session;Repositioned    Home Living                      Prior Function            PT Goals (current goals can now be found in the care plan section) Acute Rehab PT Goals Patient Stated Goal: To be stronger and walk better PT Goal Formulation: With patient Time For Goal Achievement: 04/10/17 Potential to Achieve Goals: Good Progress towards  PT goals: Progressing toward goals    Frequency    Min 2X/week      PT Plan Current plan remains appropriate    Co-evaluation              AM-PAC PT "6 Clicks" Daily Activity  Outcome Measure  Difficulty turning over in bed (including adjusting bedclothes, sheets and blankets)?: Unable Difficulty moving from lying on back to sitting on the side of the bed? : Unable Difficulty sitting down on and standing up from a chair with arms (e.g., wheelchair, bedside commode, etc,.)?: Unable Help needed moving to and from a bed to chair (including a wheelchair)?: A Lot Help needed walking in hospital room?: A Lot Help needed climbing 3-5 steps with a railing? : Total 6 Click Score: 8    End of Session Equipment Utilized During Treatment: Gait belt Activity  Tolerance: Patient limited by fatigue Patient left: in chair;with chair alarm set Nurse Communication: Mobility status PT Visit Diagnosis: Unsteadiness on feet (R26.81);Difficulty in walking, not elsewhere classified (R26.2);Muscle weakness (generalized) (M62.81)     Time: 2355-7322 PT Time Calculation (min) (ACUTE ONLY): 24 min  Charges:  $Therapeutic Exercise: 8-22 mins $Therapeutic Activity: 8-22 mins                    G Codes:       Greggory Stallion, PT, DPT [phone]    Kearsten Ginther 03/29/2017, 2:33 PM

## 2017-03-29 NOTE — Consult Note (Signed)
ORTHOPAEDIC CONSULTATION  REQUESTING PHYSICIAN: Hillary Bow, MD  Chief Complaint: Right foot cellulitis  HPI: [name] [name] is a 84 y.o. female who complains of right foot pain and swelling.  About 8 days ago she fell stepping out of a bus.  Seen in the ER and had a noted fracture of the base of the great toe.  2 sutures were placed at that time.  She was at a nursing facility.  Had an elevated white count with worsening redness and sent to the hospital and admitted with cellulitis.  Continue redness and cellulitis despite IV antibiotics.  She is currently on Ancef.  Redness has not subsided since starting IV antibiotics and consultation for right foot to see if something need to be drained or debrided.  Past Medical History:  Diagnosis Date  . breast cancer   . Hypertension    Past Surgical History:  Procedure Laterality Date  . ABDOMINAL HYSTERECTOMY    . CHOLECYSTECTOMY    . MASTECTOMY    . VASCULAR SURGERY     Social History   Socioeconomic History  . Marital status: Widowed    Spouse name: None  . Number of children: None  . Years of education: None  . Highest education level: None  Social Needs  . Financial resource strain: None  . Food insecurity - worry: None  . Food insecurity - inability: None  . Transportation needs - medical: None  . Transportation needs - non-medical: None  Occupational History  . None  Tobacco Use  . Smoking status: Never Smoker  . Smokeless tobacco: Never Used  Substance and Sexual Activity  . Alcohol use: No  . Drug use: No  . Sexual activity: Not Currently  Other Topics Concern  . None  Social History Narrative  . None   Family History  Problem Relation Age of Onset  . Heart failure Mother   . Cancer Father   . Heart attack Sister    Allergies  Allergen Reactions  . Atorvastatin Other (See Comments)    Myositis  . Bisphosphonates Other (See Comments)    GI upset  . Codeine Other (See Comments)  . Memantine Other  (See Comments)  . Omeprazole Nausea Only  . Other Other (See Comments)    Leg cramps GI upset  . Salsalate Other (See Comments)  . Sulfa Antibiotics     Pt doesn't remember  . Penicillins Itching    Has patient had a PCN reaction causing immediate rash, facial/tongue/throat swelling, SOB or lightheadedness with hypotension: Yes Has patient had a PCN reaction causing severe rash involving mucus membranes or skin necrosis: No Has patient had a PCN reaction that required hospitalization: No Has patient had a PCN reaction occurring within the last 10 years: No If all of the above answers are "NO", then may proceed with Cephalosporin use.   Prior to Admission medications   Medication Sig Start Date End Date Taking? Authorizing Provider  acetaminophen (TYLENOL) 650 MG CR tablet Take 1,300 mg by mouth 2 (two) times daily.   Yes [provider]  amLODipine (NORVASC) 10 MG tablet Take 1 tablet by mouth daily. 10/29/15  Yes [provider]  Ascorbic Acid (VITAMIN C) 1000 MG tablet Take 1,000 mg by mouth daily.   Yes [provider]  aspirin EC 81 MG tablet Take 81 mg by mouth daily.   Yes [provider]  cefpodoxime (VANTIN) 100 MG tablet Take 2 tablets (200 mg total) by mouth 2 (two) times  daily. 03/20/17  Yes Carrie Mew, MD  folic acid (FOLVITE) 1 MG tablet Take 1 mg by mouth daily.   Yes [provider]  isosorbide mononitrate (IMDUR) 30 MG 24 hr tablet Take 30 mg by mouth daily.   Yes [provider]  losartan (COZAAR) 100 MG tablet Take 100 mg by mouth daily.   Yes [provider]  metoprolol (LOPRESSOR) 100 MG tablet Take 100 mg by mouth 2 (two) times daily.   Yes [provider]  Multiple Vitamin (MULTI-VITAMINS) TABS Take 1 tablet by mouth daily.   Yes [provider]  sertraline (ZOLOFT) 100 MG tablet Take 100 mg by mouth daily.   Yes [provider]  traMADol (ULTRAM) 50 MG tablet Take 50 mg by  mouth every 6 (six) hours as needed.   Yes [provider]  traZODone (DESYREL) 50 MG tablet Take 50 mg by mouth at bedtime.   Yes [provider]  isosorbide mononitrate (IMDUR) 60 MG 24 hr tablet Take 1 tablet (60 mg total) by mouth daily. Patient not taking: Reported on 06/23/2016 11/11/15   [name], MD  methotrexate (RHEUMATREX) 2.5 MG tablet Take 6 tablets by mouth every Friday.  10/28/15   [provider]  NEOMYCIN-POLYMYXIN-HYDROCORTISONE (CORTISPORIN) 1 % SOLN otic solution Apply 1-2 drops to toe BID after soaking Patient not taking: Reported on 03/19/2017 07/03/16   Hyatt, Max T, DPM  oxyCODONE (OXY IR/ROXICODONE) 5 MG immediate release tablet Take 1 tablet (5 mg total) by mouth every 4 (four) hours as needed for severe pain. Patient not taking: Reported on 03/26/2017 03/22/17   Toni Arthurs, NP  oxyCODONE-acetaminophen (PERCOCET) 5-325 MG tablet Take 1 tablet by mouth every 4 (four) hours as needed for severe pain. Patient not taking: Reported on 03/26/2017 03/20/17   Rudene Re, MD   No results found.  Positive ROS overall patient seemed mildly confused.  Did not actually remember falling until she was prompted by family member in the room.  Physical Exam: General: Alert and oriented.  No apparent distress.  Vascular:  Left foot:Dorsalis Pedis:  present Posterior Tibial:  present  Right foot: Dorsalis Pedis:  absent Posterior Tibial:  absent.  Apparently there was a audible pulse to the right foot in the ER.  Dorsal aspect of the right foot was marked with area for dorsalis pedis pulse.  Neuro:intact  Derm: Left foot without issue.  Right foot with diffuse cellulitis to the dorsal right foot from the ankle distally.  There was one small superficial scab with 2 sutures in place.  I removed the sutures and debrided superficially the scab with a 15 blade.  Limited to superficial debridement of the skin but there was no purulent drainage underneath.   No abscess.  Ortho/MS: Diffuse edema to the right foot.  She is sensitive with palpation to the right foot.  Left foot is asymptomatic.  Assessment: Cellulitis right lower extremity  Great toe fracture  Plan: She is failed Ancef at this time and has continued residual cellulitis.  There was no purulent drainage under the scab that I debrided today with a 15 blade.  At this point would broaden the antibiotic spectrum at this time and monitor.  Her white blood cell count is around 11,000 at this point.  Blood cultures were negative.  Nothing to culture at this point.  If broad-spectrum antibiotics does not improve her condition would recommend an MRI of the right foot.    Elesa Hacker, DPM Cell (  [phone]   03/29/2017 1:20 PM

## 2017-03-29 NOTE — Progress Notes (Signed)
Per [facility] case manager patient's case has been sent to the medical director for review because she is a re-admission. [facility] SNF authorization is pending.   [employer], LCSW [phone]

## 2017-03-29 NOTE — Progress Notes (Signed)
Peer to Peer was completed. Clinical Education officer, museum (CSW) left Health Team case manager a voicemail to see how to proceed.   [employer], LCSW [phone]

## 2017-03-29 NOTE — Progress Notes (Signed)
[place] at [place] NAME: [name] [name]    MR#:  [id number]  DATE OF BIRTH:  October 18, 1930  SUBJECTIVE:  CHIEF COMPLAINT:   Chief Complaint  Patient presents with  . Foot Pain   Pain and redness of right foot Afebrile  REVIEW OF SYSTEMS:  Review of Systems  Constitutional: Positive for malaise/fatigue. Negative for chills and fever.  HENT: Negative for sore throat.   Eyes: Negative for blurred vision and double vision.  Respiratory: Negative for cough, hemoptysis, shortness of breath, wheezing and stridor.   Cardiovascular: Negative for chest pain, palpitations, orthopnea and leg swelling.  Gastrointestinal: Negative for abdominal pain, blood in stool, diarrhea, melena, nausea and vomiting.  Genitourinary: Negative for dysuria, flank pain and hematuria.  Musculoskeletal: Positive for joint pain. Negative for back pain.       Right foot swelling, redness and tenderness.  Skin: Negative for rash.  Neurological: Positive for weakness. Negative for dizziness, sensory change, focal weakness, seizures, loss of consciousness and headaches.  Endo/Heme/Allergies: Negative for polydipsia.  Psychiatric/Behavioral: Negative for depression. The patient is not nervous/anxious.     DRUG ALLERGIES:   Allergies  Allergen Reactions  . Atorvastatin Other (See Comments)    Myositis  . Bisphosphonates Other (See Comments)    GI upset  . Codeine Other (See Comments)  . Memantine Other (See Comments)  . Omeprazole Nausea Only  . Other Other (See Comments)    Leg cramps GI upset  . Salsalate Other (See Comments)  . Sulfa Antibiotics     Pt doesn't remember  . Penicillins Itching    Has patient had a PCN reaction causing immediate rash, facial/tongue/throat swelling, SOB or lightheadedness with hypotension: Yes Has patient had a PCN reaction causing severe rash involving mucus membranes or skin necrosis: No Has patient had a PCN reaction that  required hospitalization: No Has patient had a PCN reaction occurring within the last 10 years: No If all of the above answers are "NO", then may proceed with Cephalosporin use.   VITALS:  Blood pressure (!) 174/68, pulse 82, temperature 98.6 F (37 C), temperature source Oral, resp. rate 16, height 5' 3.5" (1.613 m), weight 65.3 kg (143 lb 15.4 oz), SpO2 95 %. PHYSICAL EXAMINATION:  Physical Exam  Constitutional: She is oriented to person, place, and time and well-developed, well-nourished, and in no distress.  HENT:  Head: Normocephalic.  Mouth/Throat: Oropharynx is clear and moist.  Eyes: Conjunctivae and EOM are normal. Pupils are equal, round, and reactive to light. No scleral icterus.  Neck: Normal range of motion. Neck supple. No JVD present. No tracheal deviation present.  Cardiovascular: Normal rate, regular rhythm and normal heart sounds. Exam reveals no gallop.  No murmur heard. Pulmonary/Chest: Effort normal and breath sounds normal. No respiratory distress. She has no wheezes. She has no rales.  Abdominal: Soft. Bowel sounds are normal. She exhibits no distension. There is no tenderness. There is no rebound.  Musculoskeletal: She exhibits no edema or tenderness.  right foot swelling, erythema and tenderness.  Neurological: She is alert and oriented to person, place, and time. No cranial nerve deficit.  Skin: No rash noted. No erythema.  Psychiatric: Affect normal.   LABORATORY PANEL:  Female CBC Recent Labs  Lab 03/27/17 0323  WBC 11.5*  HGB 12.4  HCT 36.7  PLT 280   ------------------------------------------------------------------------------------------------------------------ Chemistries  Recent Labs  Lab 03/27/17 0323  NA 135  K 3.9  CL 104  CO2 24  GLUCOSE 116*  BUN 14  CREATININE 0.46  CALCIUM 8.2*   RADIOLOGY:  No results found. ASSESSMENT AND PLAN:   *Right foot cellulitis.  No history of MRSA.   On IV cefazolin.   Sepsis present on  admission with fever and leukocytosis.  Lactic acid normal. Resolved Cx negative. Afebrile Foot still is red and tender. Developing abscess?  Continue IV abx  No improvement Consult podiatry  *Hypertension.  Continue home medications.   All the records are reviewed and case discussed with Care Management/Social Worker. Management plans discussed with the patient, family and they are in agreement.  CODE STATUS: DNR  TOTAL TIME TAKING CARE OF THIS PATIENT: 35 minutes.   POSSIBLE D/C IN 2 DAYS, DEPENDING ON CLINICAL CONDITION.   Neita Carp M.D on 03/29/2017 at 11:23 AM  Between 7am to 6pm - Pager - [phone]  After 6pm go to [email or web address] - [facility]

## 2017-03-29 NOTE — Progress Notes (Signed)
Pharmacy Antibiotic Note  [name] [name] is a 84 y.o. female admitted on 03/26/2017 with continued cellulitis of right foot not responding to cefazolin.  Pharmacy has been consulted for vancomycin dosing.  Plan: Ke=  0.045 h-1 Vd= 41.3 L   Vancomycin 1000 mg iv q 24 hours with stacked dosing and a trough with the 4th dose. Goal trough 15-20 mcg/ml until r/o osteo.   Height: 5' 3.5" (161.3 cm) Weight: 143 lb 15.4 oz (65.3 kg) IBW/kg (Calculated) : 53.55  Temp (24hrs), Avg:99.1 F (37.3 C), Min:98.6 F (37 C), Max:100.1 F (37.8 C)  Recent Labs  Lab 03/23/17 1435 03/26/17 0600 03/26/17 1151 03/26/17 1625 03/27/17 0323  WBC 11.6* 10.9 10.9  --  11.5*  CREATININE  --   --  0.66  --  0.46  LATICACIDVEN  --   --  0.9 1.1  --     Estimated Creatinine Clearance: 48.2 mL/min (by C-G formula based on SCr of 0.46 mg/dL).    Allergies  Allergen Reactions  . Atorvastatin Other (See Comments)    Myositis  . Bisphosphonates Other (See Comments)    GI upset  . Codeine Other (See Comments)  . Memantine Other (See Comments)  . Omeprazole Nausea Only  . Other Other (See Comments)    Leg cramps GI upset  . Salsalate Other (See Comments)  . Sulfa Antibiotics     Pt doesn't remember  . Penicillins Itching    Has patient had a PCN reaction causing immediate rash, facial/tongue/throat swelling, SOB or lightheadedness with hypotension: Yes Has patient had a PCN reaction causing severe rash involving mucus membranes or skin necrosis: No Has patient had a PCN reaction that required hospitalization: No Has patient had a PCN reaction occurring within the last 10 years: No If all of the above answers are "NO", then may proceed with Cephalosporin use.    Antimicrobials this admission: Vancomycin 1/28 x 1,  1/31 >>  cefazolin 1/28 >> 1/31  Dose adjustments this admission:   Microbiology results: 1/28 BCx: NGTD 1/28 MRSA PCR: negative  Thank you for allowing pharmacy to be a part of  this patient's care.  Napoleon Form 03/29/2017 3:56 PM

## 2017-03-30 ENCOUNTER — Encounter
Admission: RE | Admit: 2017-03-30 | Discharge: 2017-03-30 | Disposition: A | Discharge status: Home / Self Care | Payer: PPO | Source: Ambulatory Visit | Attending: Internal Medicine | Admitting: Internal Medicine

## 2017-03-30 ENCOUNTER — Inpatient Hospital Stay: Payer: PPO

## 2017-03-30 LAB — GLUCOSE, CAPILLARY: GLUCOSE-CAPILLARY: 112 mg/dL — AB (ref 65–99)

## 2017-03-30 MED ORDER — DEXTROSE 5 % IV SOLN
1.0000 g | Freq: Every day | INTRAVENOUS | Status: DC
  Administered 2017-03-30 – 2017-04-02 (×4): 1 g via INTRAVENOUS
  Filled 2017-03-30 (×5): qty 10

## 2017-03-30 MED ORDER — HYDRALAZINE HCL 25 MG PO TABS
25.0000 mg | ORAL_TABLET | Freq: Three times a day (TID) | ORAL | Status: DC
  Administered 2017-03-30 – 2017-04-02 (×7): 25 mg via ORAL
  Filled 2017-03-30 (×13): qty 1

## 2017-03-30 NOTE — Progress Notes (Signed)
Patient's BP 190/70 Manual. MD Sudini paged and made aware.

## 2017-03-30 NOTE — Progress Notes (Signed)
MRI shows multiple fractures to foot. Pt is very tender to foot.  No abscess.  Cellulitis only at this time. No surgery indicated. Recommend NWB to right foot for now. PT is seeing. Have pt f/u in outpt clinic in 2-3 weeks.

## 2017-03-30 NOTE — Progress Notes (Signed)
Physical Therapy Treatment Patient Details Name: [name] [name] MRN: [id number] DOB: [birth year] Today's Date: 03/30/2017    History of Present Illness Pt admitted R foot cellulitis. Pt with history of OA, RA, pulmonary HTN, chest pain, GERD, HTN. Pt has been at SNF since previous admission for fall off bus step. Confirmed with Dr. Vickki Muff this date that pt is WBAT, however pt not tolerating weight at this time, chooses NWB.    PT Comments    Pt is making limited progress towards goals secondary to poor pain control. Pt unable to tolerate WBing, self imposed NWB on R foot. Able to stand and pivot to recliner, however needs increased assist this date. Increased confusion noted this date with agitation during night shift per RN. Pt willing and motivated to participate, however limited ability to ambulate this date. Expect improvement towards goals when pain subsides.   Follow Up Recommendations  SNF     Equipment Recommendations  None recommended by PT    Recommendations for Other Services       Precautions / Restrictions Precautions Precautions: Fall Restrictions Weight Bearing Restrictions: No RLE Weight Bearing: Weight bearing as tolerated    Mobility  Bed Mobility Overal bed mobility: Needs Assistance Bed Mobility: Supine to Sit     Supine to sit: Min guard     General bed mobility comments: Safe technique. Cues for sequencing  Transfers Overall transfer level: Needs assistance Equipment used: Rolling walker (2 wheeled) Transfers: Sit to/from Stand Sit to Stand: Min assist;+2 physical assistance         General transfer comment: able to stand with upright posture. Chooses NWB.  Ambulation/Gait Ambulation/Gait assistance: Mod assist;+2 physical assistance Ambulation Distance (Feet): 3 Feet Assistive device: Rolling walker (2 wheeled) Gait Pattern/deviations: Step-to pattern     General Gait Details: Unable to hop this date, having increased pain on R LE. Needs  +2 assist this date. Fatigues quickly, unable to further tolerate mobility efforts.   Stairs            Wheelchair Mobility    Modified Rankin (Stroke Patients Only)       Balance                                            Cognition Arousal/Alertness: Awake/alert Behavior During Therapy: WFL for tasks assessed/performed Overall Cognitive Status: Impaired/Different from baseline                                        Exercises Other Exercises Other Exercises: Seated ther-ex performed on B LE including L side ankle pumps, hip abd/add, and SLRs. R LE ther-ex including SLR, quad sets, and heel slides. All ther-ex performed x 10 reps with quick fatigue and min assist.    General Comments        Pertinent Vitals/Pain Pain Assessment: Faces Faces Pain Scale: Hurts even more Pain Location: R foot Pain Descriptors / Indicators: Aching;Sore Pain Intervention(s): Limited activity within patient's tolerance;Repositioned    Home Living                      Prior Function            PT Goals (current goals can now be found in the care plan section) Acute Rehab PT  Goals Patient Stated Goal: To be stronger and walk better PT Goal Formulation: With patient Time For Goal Achievement: 04/10/17 Potential to Achieve Goals: Good Progress towards PT goals: Not progressing toward goals - comment(due to pain control)    Frequency    Min 2X/week      PT Plan Current plan remains appropriate    Co-evaluation              AM-PAC PT "6 Clicks" Daily Activity  Outcome Measure  Difficulty turning over in bed (including adjusting bedclothes, sheets and blankets)?: Unable Difficulty moving from lying on back to sitting on the side of the bed? : Unable Difficulty sitting down on and standing up from a chair with arms (e.g., wheelchair, bedside commode, etc,.)?: Unable Help needed moving to and from a bed to chair (including a  wheelchair)?: A Lot Help needed walking in hospital room?: A Lot Help needed climbing 3-5 steps with a railing? : Total 6 Click Score: 8    End of Session Equipment Utilized During Treatment: Gait belt Activity Tolerance: Patient limited by pain Patient left: in chair;with chair alarm set Nurse Communication: Mobility status PT Visit Diagnosis: Unsteadiness on feet (R26.81);Difficulty in walking, not elsewhere classified (R26.2);Muscle weakness (generalized) (M62.81)     Time: 1093-2355 PT Time Calculation (min) (ACUTE ONLY): 24 min  Charges:  $Therapeutic Exercise: 8-22 mins $Therapeutic Activity: 8-22 mins                    G Codes:       Greggory Stallion, PT, DPT [phone]    Aran Menning 03/30/2017, 1:45 PM

## 2017-03-30 NOTE — Progress Notes (Signed)
Foot still very erythematous.  Very sensitive. Superficial blister distal around 3rd/4th toes. Audible DP and PT with [place].  Will order MRI to r/o abscess.

## 2017-03-30 NOTE — Progress Notes (Signed)
Clinical Social Worker (CSW) contacted patient's sister [name] and made her aware that [employer] medical director denied SNF and a peer to peer was completed. [facility] SNF authorization request was withdrawn and a new one was started today. CSW made sister aware that [employer] may deny payment for patient to go back to [facility]. Sister appeared angry and upset and stated that patient lives alone and has nobody to help assist her at this time. CSW provided emotional support. CSW explained that patient may have to pay privately to go back to [facility] if needed. Sister verbalized her understanding. [facility] admissions coordinator at [facility] is aware of above.   [employer], LCSW [phone]

## 2017-03-30 NOTE — Progress Notes (Signed)
[employer] [facility] SNF authorization has been received, authorization # [id number]. Plan is for patient to D/C back to [facility] when medically stable. Per [facility] admissions coordinator at [facility] patient can return to room 208-A. Patient's sister [name] is aware of above. MD is aware of above.   [employer], LCSW [phone]

## 2017-03-30 NOTE — Progress Notes (Signed)
Pharmacy Antibiotic Note  [name] [name] is a 84 y.o. female admitted on 03/26/2017 with continued cellulitis of right foot not responding to cefazolin.  Pharmacy has been consulted for vancomycin dosing.  Plan: Ke=  0.045 h-1 Vd= 41.3 L   Vancomycin 1000 mg iv q 24 hours with stacked dosing and a trough with the 4th dose. Goal trough 15-20 mcg/ml until r/o osteo.   Height: 5' 3.5" (161.3 cm) Weight: 145 lb (65.8 kg) IBW/kg (Calculated) : 53.55  Temp (24hrs), Avg:98.6 F (37 C), Min:97.7 F (36.5 C), Max:99.8 F (37.7 C)  Recent Labs  Lab 03/23/17 1435 03/26/17 0600 03/26/17 1151 03/26/17 1625 03/27/17 0323  WBC 11.6* 10.9 10.9  --  11.5*  CREATININE  --   --  0.66  --  0.46  LATICACIDVEN  --   --  0.9 1.1  --     Estimated Creatinine Clearance: 48.3 mL/min (by C-G formula based on SCr of 0.46 mg/dL).    Allergies  Allergen Reactions  . Atorvastatin Other (See Comments)    Myositis  . Bisphosphonates Other (See Comments)    GI upset  . Codeine Other (See Comments)  . Memantine Other (See Comments)  . Omeprazole Nausea Only  . Other Other (See Comments)    Leg cramps GI upset  . Salsalate Other (See Comments)  . Sulfa Antibiotics     Pt doesn't remember  . Penicillins Itching    Has patient had a PCN reaction causing immediate rash, facial/tongue/throat swelling, SOB or lightheadedness with hypotension: Yes Has patient had a PCN reaction causing severe rash involving mucus membranes or skin necrosis: No Has patient had a PCN reaction that required hospitalization: No Has patient had a PCN reaction occurring within the last 10 years: No If all of the above answers are "NO", then may proceed with Cephalosporin use.    Antimicrobials this admission: Vancomycin 1/28 x 1,  1/31 >>  cefazolin 1/28 >> 1/31  Dose adjustments this admission:   Microbiology results: 1/28 BCx: NGTD 1/28 MRSA PCR: negative  Thank you for allowing pharmacy to be a part of this  patient's care.  Ulice Dash D 03/30/2017 9:11 AM

## 2017-03-30 NOTE — Progress Notes (Addendum)
[place] at [place] NAME: [name] [name]    MR#:  [id number]  DATE OF BIRTH:  12/31/1930  SUBJECTIVE:  CHIEF COMPLAINT:   Chief Complaint  Patient presents with  . Foot Pain   Pain and redness of right foot Afebrile  Confused intermittently  REVIEW OF SYSTEMS:  Review of Systems  Constitutional: Positive for malaise/fatigue. Negative for chills and fever.  HENT: Negative for sore throat.   Eyes: Negative for blurred vision and double vision.  Respiratory: Negative for cough, hemoptysis, shortness of breath, wheezing and stridor.   Cardiovascular: Negative for chest pain, palpitations, orthopnea and leg swelling.  Gastrointestinal: Negative for abdominal pain, blood in stool, diarrhea, melena, nausea and vomiting.  Genitourinary: Negative for dysuria, flank pain and hematuria.  Musculoskeletal: Positive for joint pain. Negative for back pain.       Right foot swelling, redness and tenderness.  Skin: Negative for rash.  Neurological: Positive for weakness. Negative for dizziness, sensory change, focal weakness, seizures, loss of consciousness and headaches.  Endo/Heme/Allergies: Negative for polydipsia.  Psychiatric/Behavioral: Negative for depression. The patient is not nervous/anxious.     DRUG ALLERGIES:   Allergies  Allergen Reactions  . Atorvastatin Other (See Comments)    Myositis  . Bisphosphonates Other (See Comments)    GI upset  . Codeine Other (See Comments)  . Memantine Other (See Comments)  . Omeprazole Nausea Only  . Other Other (See Comments)    Leg cramps GI upset  . Salsalate Other (See Comments)  . Sulfa Antibiotics     Pt doesn't remember  . Penicillins Itching    Has patient had a PCN reaction causing immediate rash, facial/tongue/throat swelling, SOB or lightheadedness with hypotension: Yes Has patient had a PCN reaction causing severe rash involving mucus membranes or skin necrosis: No Has patient  had a PCN reaction that required hospitalization: No Has patient had a PCN reaction occurring within the last 10 years: No If all of the above answers are "NO", then may proceed with Cephalosporin use.   VITALS:  Blood pressure (!) 190/70, pulse 67, temperature 97.7 F (36.5 C), temperature source Oral, resp. rate 20, height 5' 3.5" (1.613 m), weight 65.8 kg (145 lb), SpO2 97 %. PHYSICAL EXAMINATION:  Physical Exam  Constitutional: She is oriented to person, place, and time and well-developed, well-nourished, and in no distress.  HENT:  Head: Normocephalic.  Mouth/Throat: Oropharynx is clear and moist.  Eyes: Conjunctivae and EOM are normal. Pupils are equal, round, and reactive to light. No scleral icterus.  Neck: Normal range of motion. Neck supple. No JVD present. No tracheal deviation present.  Cardiovascular: Normal rate, regular rhythm and normal heart sounds. Exam reveals no gallop.  No murmur heard. Pulmonary/Chest: Effort normal and breath sounds normal. No respiratory distress. She has no wheezes. She has no rales.  Abdominal: Soft. Bowel sounds are normal. She exhibits no distension. There is no tenderness. There is no rebound.  Musculoskeletal: She exhibits no edema or tenderness.  right foot swelling, erythema and tenderness.  Neurological: She is alert and oriented to person, place, and time. No cranial nerve deficit.  Skin: No rash noted. No erythema.  Psychiatric: Affect normal.   LABORATORY PANEL:  Female CBC Recent Labs  Lab 03/27/17 0323  WBC 11.5*  HGB 12.4  HCT 36.7  PLT 280   ------------------------------------------------------------------------------------------------------------------ Chemistries  Recent Labs  Lab 03/27/17 0323  NA 135  K 3.9  CL 104  CO2  24  GLUCOSE 116*  BUN 14  CREATININE 0.46  CALCIUM 8.2*   RADIOLOGY:  No results found. ASSESSMENT AND PLAN:   *Right foot cellulitis.  No history of MRSA.   Was On IV cefazolin.     Sepsis present on admission with fever and leukocytosis.  Lactic acid normal. Resolved  Afebrile Foot still is red and tender. No discharge No significant improvement Consulted podiatry. Dicussed with Dr. Vickki Muff after eval. No drainable pocket. Broaden abx to vanc and ceftriaxone  *Hypertension.  Continue home medications.  Add hydralazine PO today due to uncontrolled htn  * Weakness Also unable to bear any weight on left foot due to fracture and now cellulitis  All the records are reviewed and case discussed with Care Management/Social Worker. Management plans discussed with the patient, family and they are in agreement.  CODE STATUS: DNR  TOTAL TIME TAKING CARE OF THIS PATIENT: 35 minutes.   POSSIBLE D/C IN 2 DAYS, DEPENDING ON CLINICAL CONDITION.  Leia Alf Kempton Milne M.D on 03/30/2017 at 10:56 AM  Between 7am to 6pm - Pager - [phone]  After 6pm go to [email or web address] - [facility]

## 2017-03-31 LAB — BASIC METABOLIC PANEL
ANION GAP: 9 (ref 5–15)
BUN: 19 mg/dL (ref 6–20)
CALCIUM: 8.3 mg/dL — AB (ref 8.9–10.3)
CHLORIDE: 104 mmol/L (ref 101–111)
CO2: 24 mmol/L (ref 22–32)
CREATININE: 0.63 mg/dL (ref 0.44–1.00)
GFR calc non Af Amer: >60 mL/min (ref >60)
Glucose, Bld: 105 mg/dL — ABNORMAL HIGH (ref 65–99)
Potassium: 3.3 mmol/L — ABNORMAL LOW (ref 3.5–5.1)
SODIUM: 137 mmol/L (ref 135–145)

## 2017-03-31 LAB — CBC WITH DIFFERENTIAL/PLATELET
BASOS PCT: 1 %
Basophils Absolute: 0.1 10*3/uL (ref 0–0.1)
EOS ABS: 0.2 10*3/uL (ref 0–0.7)
Eosinophils Relative: 2 %
HCT: 35.8 % (ref 35.0–47.0)
HEMOGLOBIN: 11.8 g/dL — AB (ref 12.0–16.0)
LYMPHS ABS: 1.4 10*3/uL (ref 1.0–3.6)
Lymphocytes Relative: 10 %
MCH: 38.6 pg — ABNORMAL HIGH (ref 26.0–34.0)
MCHC: 32.8 g/dL (ref 32.0–36.0)
MCV: 117.5 fL — ABNORMAL HIGH (ref 80.0–100.0)
Monocytes Absolute: 2 10*3/uL — ABNORMAL HIGH (ref 0.2–0.9)
Monocytes Relative: 15 %
NEUTROS ABS: 9.7 10*3/uL — AB (ref 1.4–6.5)
NEUTROS PCT: 72 %
Platelets: 345 10*3/uL (ref 150–440)
RBC: 3.05 MIL/uL — AB (ref 3.80–5.20)
RDW: 15.5 % — ABNORMAL HIGH (ref 11.5–14.5)
WBC: 13.5 10*3/uL — AB (ref 3.6–11.0)

## 2017-03-31 LAB — CULTURE, BLOOD (ROUTINE X 2)
Culture: NO GROWTH
Culture: NO GROWTH
SPECIAL REQUESTS: ADEQUATE

## 2017-03-31 LAB — GLUCOSE, CAPILLARY
GLUCOSE-CAPILLARY: 123 mg/dL — AB (ref 65–99)
Glucose-Capillary: 110 mg/dL — ABNORMAL HIGH (ref 65–99)

## 2017-03-31 MED ORDER — MEGESTROL ACETATE 400 MG/10ML PO SUSP
400.0000 mg | Freq: Every day | ORAL | Status: DC
  Administered 2017-03-31 – 2017-04-02 (×3): 400 mg via ORAL
  Filled 2017-03-31 (×4): qty 10

## 2017-03-31 MED ORDER — SENNOSIDES-DOCUSATE SODIUM 8.6-50 MG PO TABS
2.0000 | ORAL_TABLET | Freq: Every day | ORAL | Status: DC
  Administered 2017-03-31 – 2017-04-02 (×3): 2 via ORAL
  Filled 2017-03-31 (×3): qty 2

## 2017-03-31 NOTE — Progress Notes (Signed)
[place] [facility] [place] NAME: [name] [name]    MR#:  [id number]  DATE OF BIRTH:  [birth year]  SUBJECTIVE:  CHIEF COMPLAINT:   Chief Complaint  Patient presents with  . Foot Pain  Complains of poor appetite as well as constipation, no events overnight per nursing staff  REVIEW OF SYSTEMS:  CONSTITUTIONAL: No fever, fatigue or weakness.  EYES: No blurred or double vision.  EARS, NOSE, AND THROAT: No tinnitus or ear pain.  RESPIRATORY: No cough, shortness of breath, wheezing or hemoptysis.  CARDIOVASCULAR: No chest pain, orthopnea, edema.  GASTROINTESTINAL: No nausea, vomiting, diarrhea or abdominal pain.  GENITOURINARY: No dysuria, hematuria.  ENDOCRINE: No polyuria, nocturia,  HEMATOLOGY: No anemia, easy bruising or bleeding SKIN: No rash or lesion. MUSCULOSKELETAL: No joint pain or arthritis.   NEUROLOGIC: No tingling, numbness, weakness.  PSYCHIATRY: No anxiety or depression.   ROS  DRUG ALLERGIES:   Allergies  Allergen Reactions  . Atorvastatin Other (See Comments)    Myositis  . Bisphosphonates Other (See Comments)    GI upset  . Codeine Other (See Comments)  . Memantine Other (See Comments)  . Omeprazole Nausea Only  . Other Other (See Comments)    Leg cramps GI upset  . Salsalate Other (See Comments)  . Sulfa Antibiotics     Pt doesn't remember  . Penicillins Itching    Has patient had a PCN reaction causing immediate rash, facial/tongue/throat swelling, SOB or lightheadedness with hypotension: Yes Has patient had a PCN reaction causing severe rash involving mucus membranes or skin necrosis: No Has patient had a PCN reaction that required hospitalization: No Has patient had a PCN reaction occurring within the last 10 years: No If all of the above answers are "NO", then may proceed with Cephalosporin use.    VITALS:  Blood pressure (!) 140/50, pulse 92, temperature 99.3 F (37.4 C), temperature source Oral, resp. rate  18, height 5' 3.5" (1.613 m), weight 66.3 kg (146 lb 2.6 oz), SpO2 93 %.  PHYSICAL EXAMINATION:  GENERAL:  84 y.o.-year-old patient lying in the bed with no acute distress.  EYES: Pupils equal, round, reactive to light and accommodation. No scleral icterus. Extraocular muscles intact.  HEENT: Head atraumatic, normocephalic. Oropharynx and nasopharynx clear.  NECK:  Supple, no jugular venous distention. No thyroid enlargement, no tenderness.  LUNGS: Normal breath sounds bilaterally, no wheezing, rales,rhonchi or crepitation. No use of accessory muscles of respiration.  CARDIOVASCULAR: S1, S2 normal. No murmurs, rubs, or gallops.  ABDOMEN: Soft, nontender, nondistended. Bowel sounds present. No organomegaly or mass.  EXTREMITIES: No pedal edema, cyanosis, or clubbing.  NEUROLOGIC: Cranial nerves II through XII are intact. Muscle strength 5/5 in all extremities. Sensation intact. Gait not checked.  PSYCHIATRIC: The patient is alert and oriented x 3.  SKIN: No obvious rash, lesion, or ulcer.   Physical Exam LABORATORY PANEL:   CBC Recent Labs  Lab 03/31/17 0324  WBC 13.5*  HGB 11.8*  HCT 35.8  PLT 345   ------------------------------------------------------------------------------------------------------------------  Chemistries  Recent Labs  Lab 03/31/17 0324  NA 137  K 3.3*  CL 104  CO2 24  GLUCOSE 105*  BUN 19  CREATININE 0.63  CALCIUM 8.3*   ------------------------------------------------------------------------------------------------------------------  Cardiac Enzymes No results for input(s): TROPONINI in the last 168 hours. ------------------------------------------------------------------------------------------------------------------  RADIOLOGY:  Mr Foot Right Wo Contrast  Result Date: 03/30/2017 CLINICAL DATA:  Cellulitis of the toe.  Pain. EXAM: MRI OF THE RIGHT FOREFOOT WITHOUT CONTRAST TECHNIQUE:  Multiplanar, multisequence MR imaging of the right forefoot  was performed. No intravenous contrast was administered. COMPARISON:  None. FINDINGS: Bones/Joint/Cartilage Nondisplaced fracture at the lateral base of the first proximal phalanx with mild surrounding marrow edema. Nondisplaced oblique fracture of the distal shaft of the first metatarsal with surrounding marrow edema. Nondisplaced fracture of the second metatarsal neck with marrow edema in the second metatarsal shaft and second metatarsal head. Nondisplaced fracture of third metatarsal head. Severe marrow edema with a linear component involving the distal anterior calcaneus adjacent to the calcaneocuboid joint partially visualized most consistent with a nondisplaced fracture. No periosteal reaction or bone destruction. Normal alignment. No joint effusion. Ligaments Collateral ligaments are intact.  Lisfranc ligament is intact Muscles and Tendons Flexor, peroneal and extensor compartment tendons are intact. Muscles are normal. Soft tissue No fluid collection or hematoma. No soft tissue mass. Soft tissue edema along the dorsal aspect of the foot which may be reactive secondary to venous insufficiency versus cellulitis. IMPRESSION: 1. Nondisplaced fracture at the lateral base of the first proximal phalanx with mild surrounding marrow edema. 2. Nondisplaced oblique fracture of the distal shaft of the first metatarsal with surrounding marrow edema. 3. Nondisplaced fracture of the second metatarsal neck with marrow edema in the second metatarsal shaft and second metatarsal head. 4. Nondisplaced fracture of third metatarsal head. 5. Nondisplaced fracture of the distal anterior calcaneus at the calcaneocuboid joint. 6. No evidence of osteomyelitis. Electronically Signed   By: Kathreen Devoid   On: 03/30/2017 16:29    ASSESSMENT AND PLAN:  *Acute deep-seated right foot infection  No improvement on cefazolin  MRI negative for osteomyelitis  Podiatry input appreciated  Continue vancomycin/Rocephin and continue  observation   *Hypertension Stable on current regiment  * Weakness Stable Also unable to bear any weight on left foot due to fracture and now cellulitis  *Acute on chronic poor appetite Megace bid Consult dietary  *Acute constipation Senokot at bedtime   All the records are reviewed and case discussed with Care Management/Social Workerr. Management plans discussed with the patient, family and they are in agreement.  CODE STATUS: dnr  TOTAL TIME TAKING CARE OF THIS PATIENT: 40 minutes.     POSSIBLE D/C IN 2-4 DAYS, DEPENDING ON CLINICAL CONDITION.   Avel Peace Salary M.D on 03/31/2017   Between 7am to 6pm - Pager - [phone]  After 6pm go to [email or web address] - password [email or web address] [facility]  Office  [phone]  CC: Primary care physician; Idelle Crouch, MD  Note: This dictation was prepared with Dragon dictation along with smaller phrase technology. Any transcriptional errors that result from this process are unintentional.

## 2017-03-31 NOTE — Progress Notes (Signed)
Pt. Has slept well throughout the night with c/o pain x1, pt. Medicated, with effective results. Pt. Did not have any impulsive behaviors. Will continue to monitor pt.

## 2017-04-01 MED ORDER — TRAMADOL HCL 50 MG PO TABS
50.0000 mg | ORAL_TABLET | ORAL | Status: DC
  Administered 2017-04-01 – 2017-04-03 (×6): 50 mg via ORAL
  Filled 2017-04-01 (×9): qty 1

## 2017-04-01 MED ORDER — ADULT MULTIVITAMIN W/MINERALS CH
1.0000 | ORAL_TABLET | Freq: Every day | ORAL | Status: DC
  Administered 2017-04-02 – 2017-04-03 (×2): 1 via ORAL
  Filled 2017-04-01 (×2): qty 1

## 2017-04-01 MED ORDER — TRAMADOL HCL 50 MG PO TABS: 50.0000 mg | ORAL_TABLET | Freq: Four times a day (QID) | ORAL | Status: DC

## 2017-04-01 MED ORDER — MELOXICAM 7.5 MG PO TABS
7.5000 mg | ORAL_TABLET | Freq: Every day | ORAL | Status: DC
  Administered 2017-04-01 – 2017-04-03 (×3): 7.5 mg via ORAL
  Filled 2017-04-01 (×3): qty 1

## 2017-04-01 MED ORDER — ENSURE ENLIVE PO LIQD
237.0000 mL | Freq: Two times a day (BID) | ORAL | Status: DC
  Administered 2017-04-02 – 2017-04-03 (×3): 237 mL via ORAL

## 2017-04-01 NOTE — Progress Notes (Signed)
Daily Progress Note   Subjective  - * No surgery found *  Fu right foot cellulitis.  Still very painful.  Objective Vitals:   03/31/17 2007 04/01/17 0433 04/01/17 0500 04/01/17 0728  BP: (!) 137/41 (!) 169/52  (!) 163/53  Pulse: 80 83  77  Resp: 18 17  18   Temp: 100 F (37.8 C) 99 F (37.2 C)  98.7 F (37.1 C)  TempSrc: Oral   Oral  SpO2: 93% 94%  96%  Weight:   65.8 kg (145 lb 1 oz)   Height:        Physical Exam: Still very erythematous.  Superficial blister dorsal foot.  Clear.  No pus.  Erythema to foot from ankle distal.  No abscess.  MRI negative for abscess.  Multiple fracture  Laboratory CBC    Component Value Date/Time   WBC 13.5 (H) 03/31/2017 0324   HGB 11.8 (L) 03/31/2017 0324   HGB 14.5 10/01/2013 1754   HCT 35.8 03/31/2017 0324   HCT 44.8 10/01/2013 1754   PLT 345 03/31/2017 0324   PLT 210 10/01/2013 1754    BMET    Component Value Date/Time   NA 137 03/31/2017 0324   NA 138 10/01/2013 1754   K 3.3 (L) 03/31/2017 0324   K 3.8 10/01/2013 1754   CL 104 03/31/2017 0324   CL 105 10/01/2013 1754   CO2 24 03/31/2017 0324   CO2 25 10/01/2013 1754   GLUCOSE 105 (H) 03/31/2017 0324   GLUCOSE 95 10/01/2013 1754   BUN 19 03/31/2017 0324   BUN 13 10/01/2013 1754   CREATININE 0.63 03/31/2017 0324   CREATININE 0.91 10/01/2013 1754   CALCIUM 8.3 (L) 03/31/2017 0324   CALCIUM 8.6 10/01/2013 1754   GFRNONAA >60 03/31/2017 0324   GFRNONAA 59 (L) 10/01/2013 1754   GFRAA >60 03/31/2017 0324   GFRAA >60 10/01/2013 1754    Assessment/Planning: Continued cellulitis.     I cultured the superficial blister for possible ID of cellulitis  MRI and xray negative for abscess/gas. Multiple fractures.  C/W broad spectrum abx.  Will follow.  Samara Deist A  04/01/2017, 12:06 PM

## 2017-04-01 NOTE — Progress Notes (Signed)
[place] at [place] NAME: [name] [name]    MR#:  [id number]  DATE OF BIRTH:  December 15, 1930  SUBJECTIVE:  CHIEF COMPLAINT:   Chief Complaint  Patient presents with  . Foot Pain  Complains of severe right foot pain-improved on tramadol  REVIEW OF SYSTEMS:  CONSTITUTIONAL: No fever, fatigue or weakness.  EYES: No blurred or double vision.  EARS, NOSE, AND THROAT: No tinnitus or ear pain.  RESPIRATORY: No cough, shortness of breath, wheezing or hemoptysis.  CARDIOVASCULAR: No chest pain, orthopnea, edema.  GASTROINTESTINAL: No nausea, vomiting, diarrhea or abdominal pain.  GENITOURINARY: No dysuria, hematuria.  ENDOCRINE: No polyuria, nocturia,  HEMATOLOGY: No anemia, easy bruising or bleeding SKIN: No rash or lesion. MUSCULOSKELETAL: No joint pain or arthritis.   NEUROLOGIC: No tingling, numbness, weakness.  PSYCHIATRY: No anxiety or depression.   ROS  DRUG ALLERGIES:   Allergies  Allergen Reactions  . Atorvastatin Other (See Comments)    Myositis  . Bisphosphonates Other (See Comments)    GI upset  . Codeine Other (See Comments)  . Memantine Other (See Comments)  . Omeprazole Nausea Only  . Other Other (See Comments)    Leg cramps GI upset  . Salsalate Other (See Comments)  . Sulfa Antibiotics     Pt doesn't remember  . Penicillins Itching    Has patient had a PCN reaction causing immediate rash, facial/tongue/throat swelling, SOB or lightheadedness with hypotension: Yes Has patient had a PCN reaction causing severe rash involving mucus membranes or skin necrosis: No Has patient had a PCN reaction that required hospitalization: No Has patient had a PCN reaction occurring within the last 10 years: No If all of the above answers are "NO", then may proceed with Cephalosporin use.    VITALS:  Blood pressure (!) 163/53, pulse 77, temperature 98.7 F (37.1 C), temperature source Oral, resp. rate 18, height 5' 3.5" (1.613 m),  weight 65.8 kg (145 lb 1 oz), SpO2 96 %.  PHYSICAL EXAMINATION:  GENERAL:  84 y.o.-year-old patient lying in the bed with no acute distress.  EYES: Pupils equal, round, reactive to light and accommodation. No scleral icterus. Extraocular muscles intact.  HEENT: Head atraumatic, normocephalic. Oropharynx and nasopharynx clear.  NECK:  Supple, no jugular venous distention. No thyroid enlargement, no tenderness.  LUNGS: Normal breath sounds bilaterally, no wheezing, rales,rhonchi or crepitation. No use of accessory muscles of respiration.  CARDIOVASCULAR: S1, S2 normal. No murmurs, rubs, or gallops.  ABDOMEN: Soft, nontender, nondistended. Bowel sounds present. No organomegaly or mass.  EXTREMITIES: No pedal edema, cyanosis, or clubbing.  NEUROLOGIC: Cranial nerves II through XII are intact. Muscle strength 5/5 in all extremities. Sensation intact. Gait not checked.  PSYCHIATRIC: The patient is alert and oriented x 3.  SKIN: No obvious rash, lesion, or ulcer.  Deep-seated right foot infection  Physical Exam LABORATORY PANEL:   CBC Recent Labs  Lab 03/31/17 0324  WBC 13.5*  HGB 11.8*  HCT 35.8  PLT 345   ------------------------------------------------------------------------------------------------------------------  Chemistries  Recent Labs  Lab 03/31/17 0324  NA 137  K 3.3*  CL 104  CO2 24  GLUCOSE 105*  BUN 19  CREATININE 0.63  CALCIUM 8.3*   ------------------------------------------------------------------------------------------------------------------  Cardiac Enzymes No results for input(s): TROPONINI in the last 168 hours. ------------------------------------------------------------------------------------------------------------------  RADIOLOGY:  Mr Foot Right Wo Contrast  Result Date: 03/30/2017 CLINICAL DATA:  Cellulitis of the toe.  Pain. EXAM: MRI OF THE RIGHT FOREFOOT WITHOUT CONTRAST TECHNIQUE: Multiplanar, multisequence  MR imaging of the right forefoot  was performed. No intravenous contrast was administered. COMPARISON:  None. FINDINGS: Bones/Joint/Cartilage Nondisplaced fracture at the lateral base of the first proximal phalanx with mild surrounding marrow edema. Nondisplaced oblique fracture of the distal shaft of the first metatarsal with surrounding marrow edema. Nondisplaced fracture of the second metatarsal neck with marrow edema in the second metatarsal shaft and second metatarsal head. Nondisplaced fracture of third metatarsal head. Severe marrow edema with a linear component involving the distal anterior calcaneus adjacent to the calcaneocuboid joint partially visualized most consistent with a nondisplaced fracture. No periosteal reaction or bone destruction. Normal alignment. No joint effusion. Ligaments Collateral ligaments are intact.  Lisfranc ligament is intact Muscles and Tendons Flexor, peroneal and extensor compartment tendons are intact. Muscles are normal. Soft tissue No fluid collection or hematoma. No soft tissue mass. Soft tissue edema along the dorsal aspect of the foot which may be reactive secondary to venous insufficiency versus cellulitis. IMPRESSION: 1. Nondisplaced fracture at the lateral base of the first proximal phalanx with mild surrounding marrow edema. 2. Nondisplaced oblique fracture of the distal shaft of the first metatarsal with surrounding marrow edema. 3. Nondisplaced fracture of the second metatarsal neck with marrow edema in the second metatarsal shaft and second metatarsal head. 4. Nondisplaced fracture of third metatarsal head. 5. Nondisplaced fracture of the distal anterior calcaneus at the calcaneocuboid joint. 6. No evidence of osteomyelitis. Electronically Signed   By: Kathreen Devoid   On: 03/30/2017 16:29    ASSESSMENT AND PLAN:  *Acute deep-seated right foot infection  No improvement on cefazolin  MRI negative for osteomyelitis  Podiatry input appreciated  Continue vancomycin/Rocephin, consult infectious  disease for expert opinion as patient may require longer length of antibiotic treatment/possible need for PICC line, and continue close medical monitoring  *Acute pain syndrome Secondary to above as well as foot fractures Schedule tramadol and discussion with patient   *Hypertension Stable on current regiment  * Weakness Stable Also unable to bear any weight on left foot due to fracture and cellulitis  *Acute on chronic poor appetite Improved Continue Megace bid Consulted dietary  *Acute constipation Resolved Continue Senokot at bedtime  All the records are reviewed and case discussed with Care Management/Social Workerr. Management plans discussed with the patient, family and they are in agreement.  CODE STATUS: dnr  TOTAL TIME TAKING CARE OF THIS PATIENT: 40 minutes.     POSSIBLE D/C IN 2-3 DAYS, DEPENDING ON CLINICAL CONDITION.   Avel Peace Azir Muzyka M.D on 04/01/2017   Between 7am to 6pm - Pager - [phone]  After 6pm go to [email or web address] - password [email or web address] [facility]  Office  [phone]  CC: Primary care physician; Idelle Crouch, MD  Note: This dictation was prepared with Dragon dictation along with smaller phrase technology. Any transcriptional errors that result from this process are unintentional.

## 2017-04-01 NOTE — Progress Notes (Signed)
Initial Nutrition Assessment  DOCUMENTATION CODES:   Not applicable  INTERVENTION:  Provide Ensure Enlive po BID, each supplement provides 350 kcal and 20 grams of protein.  Provide daily MVI.  Encouraged adequate intake of calories and protein.  NUTRITION DIAGNOSIS:   Inadequate oral intake related to decreased appetite as evidenced by per patient/family report.  GOAL:   Patient will meet greater than or equal to 90% of their needs  MONITOR:   PO intake, Supplement acceptance, Labs, Weight trends, Skin, I & O's  REASON FOR ASSESSMENT:   Consult Assessment of nutrition requirement/status  ASSESSMENT:   84 year old female with PMHx of HTN, breast cancer s/p mastectomy admitted with right foot cellulitis.   Met with patient at bedside. She reports she has had a decreased appetite for a while now. Noted MD started patient on Megace for appetite stimulant. She reports her appetite has started to improve since then. In the past 24 hours she is finishing 80% of meals per documentation in chart. She reports at home she eats 3 meals per day. Breakfast is usually cereal in [place] (pt is lactose-intolerant). She typically goes out to eat for lunch and that is her biggest meal. Dinner is a sandwich or peanut butter crackers. Denies any N/V, abdominal pain, difficulty chewing/swallowing, constipation/diarrhea.  UBW 148-149 lbs. Patient reports she has had minimal weight loss. Noted in chart she was 146.4 lbs back on 11/09/2015.  Medications reviewed and include: folic acid 1 mg daily except Friday (?), Megace 400 mg daily, senna, sertraline, tramadol, ceftriaxone, vancomycin.  Labs reviewed: CBG 95-123, Potassium 3.3.  Patient does not meet criteria for malnutrition at this time.  NUTRITION - FOCUSED PHYSICAL EXAM:    Most Recent Value  Orbital Region  No depletion  Upper Arm Region  Mild depletion  Thoracic and Lumbar Region  No depletion  Buccal Region  No depletion  Temple  Region  Mild depletion  Clavicle Bone Region  Mild depletion  Clavicle and Acromion Bone Region  Mild depletion  Scapular Bone Region  No depletion  Dorsal Hand  Mild depletion  Patellar Region  No depletion  Anterior Thigh Region  No depletion  Posterior Calf Region  No depletion  Edema (RD Assessment)  None  Hair  Reviewed  Eyes  Reviewed  Mouth  Reviewed  Skin  Reviewed  Nails  Reviewed     Diet Order:  Diet regular Room service appropriate? Yes; Fluid consistency: Thin  EDUCATION NEEDS:   Education needs have been addressed  Skin:  Skin Assessment: Skin Integrity Issues:(cellulitis right foot; scattered ecchymosis)  Last BM:  04/01/2017 - medium type 5  Height:   Ht Readings from Last 1 Encounters:  03/26/17 5' 3.5" (1.613 m)    Weight:   Wt Readings from Last 1 Encounters:  04/01/17 145 lb 1 oz (65.8 kg)    Ideal Body Weight:  53.4 kg  BMI:  Body mass index is 25.29 kg/m.  Estimated Nutritional Needs:   Kcal:  1645-1975 (25-30 kcal/kg)  Protein:  65-80 grams (1-1.2 grams/kg)  Fluid:  1.6 L/day (25 mL/kg)  Willey Blade, MS, RD, LDN Office: [phone] Pager: [phone] After Hours/Weekend Pager: [phone]

## 2017-04-02 LAB — VANCOMYCIN, TROUGH: Vancomycin Tr: 9 ug/mL — ABNORMAL LOW (ref 15–20)

## 2017-04-02 LAB — CREATININE, SERUM: Creatinine, Ser: 0.6 mg/dL (ref 0.44–1.00)

## 2017-04-02 MED ORDER — VANCOMYCIN HCL IN DEXTROSE 1-5 GM/200ML-% IV SOLN
1000.0000 mg | Freq: Two times a day (BID) | INTRAVENOUS | Status: DC
  Administered 2017-04-02 – 2017-04-03 (×3): 1000 mg via INTRAVENOUS
  Filled 2017-04-02 (×5): qty 200

## 2017-04-02 NOTE — Progress Notes (Signed)
Physical Therapy Treatment Patient Details Name: [name] [name] MRN: [id number] DOB: [birth year] Today's Date: 04/02/2017    History of Present Illness Pt admitted R foot cellulitis. Pt with history of OA, RA, pulmonary HTN, chest pain, GERD, HTN. Pt has been at SNF since previous admission for fall off bus step. MRI performed over weekend, pt is now complete NWB on R LE per Dr. Vickki Muff.     PT Comments    Pt is making improved progress towards goals, only requiring 1 assist at this time due to improved strength. Pt still fatigues quickly with OOB mobility, however improved cognition and ability to follow commands. Good endurance with there-ex. Able to maintain correct WBing status. Still unable to ambulated long distances secondary to [facility] and weakness. Will continue to progress.   Follow Up Recommendations  SNF     Equipment Recommendations  None recommended by PT    Recommendations for Other Services       Precautions / Restrictions Precautions Precautions: Fall Restrictions Weight Bearing Restrictions: Yes RLE Weight Bearing: Non weight bearing    Mobility  Bed Mobility Overal bed mobility: Needs Assistance Bed Mobility: Supine to Sit     Supine to sit: Min guard     General bed mobility comments: improved technique with transition with ease of transfer. Once seated at EOB, able to sit with upright posture. More oriented this date  Transfers Overall transfer level: Needs assistance Equipment used: Rolling walker (2 wheeled) Transfers: Sit to/from Stand Sit to Stand: Min assist         General transfer comment: improved sequencing with cues for pushing from seated surface. Upright posture noted. Cues given for maintaining R NWB as pain is improved this date, pt wants to rest it on ground. Advised to keep elevated in air.  Ambulation/Gait Ambulation/Gait assistance: Min assist Ambulation Distance (Feet): 5 Feet Assistive device: Rolling walker (2 wheeled) Gait  Pattern/deviations: Step-to pattern     General Gait Details: able to hop x 2' and pivot/slide on L foot towards recliner. Once seated, pt reports she need to use bathroom. Another gait trial performed for transfer to [facility]. Safe technique performed, however still needs assist maintaining balance and WBing.   Stairs            Wheelchair Mobility    Modified Rankin (Stroke Patients Only)       Balance                                            Cognition Arousal/Alertness: Awake/alert Behavior During Therapy: WFL for tasks assessed/performed Overall Cognitive Status: Within Functional Limits for tasks assessed                                        Exercises Other Exercises Other Exercises: Seated ther-ex performed on B LE including L side ankle pumps, quad sets, hip abd/add, and SLRs. R LE ther-ex including SLR, quad sets, LAQ, and heel slides. All ther-ex performed x 15 reps with quick fatigue and min assist. Other Exercises: Pt able to transfer to [facility] with min assist and supevision/cga for self hygiene.     General Comments        Pertinent Vitals/Pain Pain Assessment: 0-10 Pain Score: 6  Pain Location: R foot Pain Descriptors /  Indicators: Aching;Sore Pain Intervention(s): Limited activity within patient's tolerance;Repositioned    Home Living                      Prior Function            PT Goals (current goals can now be found in the care plan section) Acute Rehab PT Goals Patient Stated Goal: To be stronger and walk better PT Goal Formulation: With patient Time For Goal Achievement: 04/10/17 Potential to Achieve Goals: Good Progress towards PT goals: Progressing toward goals    Frequency    Min 2X/week      PT Plan Current plan remains appropriate    Co-evaluation              AM-PAC PT "6 Clicks" Daily Activity  Outcome Measure  Difficulty turning over in bed (including adjusting  bedclothes, sheets and blankets)?: Unable Difficulty moving from lying on back to sitting on the side of the bed? : Unable Difficulty sitting down on and standing up from a chair with arms (e.g., wheelchair, bedside commode, etc,.)?: Unable Help needed moving to and from a bed to chair (including a wheelchair)?: A Little Help needed walking in hospital room?: A Lot Help needed climbing 3-5 steps with a railing? : Total 6 Click Score: 9    End of Session Equipment Utilized During Treatment: Gait belt Activity Tolerance: Patient limited by pain Patient left: in chair;with chair alarm set Nurse Communication: Mobility status PT Visit Diagnosis: Unsteadiness on feet (R26.81);Difficulty in walking, not elsewhere classified (R26.2);Muscle weakness (generalized) (M62.81)     Time: 7893-8101 PT Time Calculation (min) (ACUTE ONLY): 38 min  Charges:  $Gait Training: 8-22 mins $Therapeutic Exercise: 8-22 mins $Therapeutic Activity: 8-22 mins                    G Codes:       Greggory Stallion, PT, DPT [phone]    [name] [name] 04/02/2017, 11:40 AM
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
## 2017-04-02 NOTE — Progress Notes (Signed)
Per MD patient will likely need IV ABX. ID consult is pending. [facility] admissions coordinator at [facility] is aware of above.   [employer], LCSW [phone]

## 2017-04-02 NOTE — Progress Notes (Signed)
Pharmacy Antibiotic Note  [name] [name] is a 84 y.o. female admitted on 03/26/2017 with continued cellulitis of right foot not responding to cefazolin.  Pharmacy has been consulted for vancomycin dosing.  Plan: Ke=  0.045 h-1 Vd= 41.3 L   Vancomycin 1000 mg iv q 24 hours with stacked dosing and a trough with the 4th dose. Goal trough 15-20 mcg/ml until r/o osteo.   02/04 @ 0130 VT 9 subtherapeutic. Will increase dose to vanc 1g IV q12h for Css 16 mcg/mL will check trough 02/05 @ 1300 prior to 4th dose. Renal function stable.  Height: 5' 3.5" (161.3 cm) Weight: 145 lb 1 oz (65.8 kg) IBW/kg (Calculated) : 53.55  Temp (24hrs), Avg:98.8 F (37.1 C), Min:98.7 F (37.1 C), Max:99 F (37.2 C)  Recent Labs  Lab 03/26/17 0600 03/26/17 1151 03/26/17 1625 03/27/17 0323 03/31/17 0324 04/02/17 0121  WBC 10.9 10.9  --  11.5* 13.5*  --   CREATININE  --  0.66  --  0.46 0.63 0.60  LATICACIDVEN  --  0.9 1.1  --   --   --   VANCOTROUGH  --   --   --   --   --  9*    Estimated Creatinine Clearance: 48.3 mL/min (by C-G formula based on SCr of 0.6 mg/dL).    Allergies  Allergen Reactions  . Atorvastatin Other (See Comments)    Myositis  . Bisphosphonates Other (See Comments)    GI upset  . Codeine Other (See Comments)  . Memantine Other (See Comments)  . Omeprazole Nausea Only  . Other Other (See Comments)    Leg cramps GI upset  . Salsalate Other (See Comments)  . Sulfa Antibiotics     Pt doesn't remember  . Penicillins Itching    Has patient had a PCN reaction causing immediate rash, facial/tongue/throat swelling, SOB or lightheadedness with hypotension: Yes Has patient had a PCN reaction causing severe rash involving mucus membranes or skin necrosis: No Has patient had a PCN reaction that required hospitalization: No Has patient had a PCN reaction occurring within the last 10 years: No If all of the above answers are "NO", then may proceed with Cephalosporin use.     Antimicrobials this admission: Vancomycin 1/28 x 1,  1/31 >>  cefazolin 1/28 >> 1/31  Dose adjustments this admission:   Microbiology results: 1/28 BCx: NGTD 1/28 MRSA PCR: negative  Thank you for allowing pharmacy to be a part of this patient's care.  Tobie Lords, PharmD, BCPS Clinical Pharmacist 04/02/2017

## 2017-04-02 NOTE — Progress Notes (Signed)
[facility] NAME: [name] [name]    MR#:  [id number]  DATE OF BIRTH:  [birth year]  SUBJECTIVE:  CHIEF COMPLAINT:   Chief Complaint  Patient presents with  . Foot Pain  Patient feels slightly better, no events overnight per nursing staff  REVIEW OF SYSTEMS:  CONSTITUTIONAL: No fever, fatigue or weakness.  EYES: No blurred or double vision.  EARS, NOSE, AND THROAT: No tinnitus or ear pain.  RESPIRATORY: No cough, shortness of breath, wheezing or hemoptysis.  CARDIOVASCULAR: No chest pain, orthopnea, edema.  GASTROINTESTINAL: No nausea, vomiting, diarrhea or abdominal pain.  GENITOURINARY: No dysuria, hematuria.  ENDOCRINE: No polyuria, nocturia,  HEMATOLOGY: No anemia, easy bruising or bleeding SKIN: No rash or lesion. MUSCULOSKELETAL: No joint pain or arthritis.   NEUROLOGIC: No tingling, numbness, weakness.  PSYCHIATRY: No anxiety or depression.   ROS  DRUG ALLERGIES:   Allergies  Allergen Reactions  . Atorvastatin Other (See Comments)    Myositis  . Bisphosphonates Other (See Comments)    GI upset  . Codeine Other (See Comments)  . Memantine Other (See Comments)  . Omeprazole Nausea Only  . Other Other (See Comments)    Leg cramps GI upset  . Salsalate Other (See Comments)  . Sulfa Antibiotics     Pt doesn't remember  . Penicillins Itching    Has patient had a PCN reaction causing immediate rash, facial/tongue/throat swelling, SOB or lightheadedness with hypotension: Yes Has patient had a PCN reaction causing severe rash involving mucus membranes or skin necrosis: No Has patient had a PCN reaction that required hospitalization: No Has patient had a PCN reaction occurring within the last 10 years: No If all of the above answers are "NO", then may proceed with Cephalosporin use.    VITALS:  Blood pressure (!) 118/50, pulse 73, temperature 98.2 F (36.8 C), temperature source Oral, resp. rate 18, height 5' 3.5"  (1.613 m), weight 65.3 kg (144 lb), SpO2 93 %.  PHYSICAL EXAMINATION:  GENERAL:  84 y.o.-year-old patient lying in the bed with no acute distress.  EYES: Pupils equal, round, reactive to light and accommodation. No scleral icterus. Extraocular muscles intact.  HEENT: Head atraumatic, normocephalic. Oropharynx and nasopharynx clear.  NECK:  Supple, no jugular venous distention. No thyroid enlargement, no tenderness.  LUNGS: Normal breath sounds bilaterally, no wheezing, rales,rhonchi or crepitation. No use of accessory muscles of respiration.  CARDIOVASCULAR: S1, S2 normal. No murmurs, rubs, or gallops.  ABDOMEN: Soft, nontender, nondistended. Bowel sounds present. No organomegaly or mass.  EXTREMITIES: No pedal edema, cyanosis, or clubbing.  NEUROLOGIC: Cranial nerves II through XII are intact. Muscle strength 5/5 in all extremities. Sensation intact. Gait not checked.  PSYCHIATRIC: The patient is alert and oriented x 3.  SKIN: No obvious rash, lesion, or ulcer.  Deep-seated right foot infection  Physical Exam LABORATORY PANEL:   CBC Recent Labs  Lab 03/31/17 0324  WBC 13.5*  HGB 11.8*  HCT 35.8  PLT 345   ------------------------------------------------------------------------------------------------------------------  Chemistries  Recent Labs  Lab 03/31/17 0324 04/02/17 0121  NA 137  --   K 3.3*  --   CL 104  --   CO2 24  --   GLUCOSE 105*  --   BUN 19  --   CREATININE 0.63 0.60  CALCIUM 8.3*  --    ------------------------------------------------------------------------------------------------------------------  Cardiac Enzymes No results for input(s): TROPONINI in the last 168 hours. ------------------------------------------------------------------------------------------------------------------  RADIOLOGY:  No results found.  ASSESSMENT  AND PLAN:  *Acute deep-seated right foot infection  No improvement on cefazolin  MRI negative for osteomyelitis   Podiatry input appreciated  Continue vancomycin/Rocephin for now, ID to see as patient may require longer length of antibiotic treatment/possible need for PICC line, cannot rule out possible antibiotic resistant organism, and continue close medical monitoring  *Acute pain syndrome Improved Secondary to above as well as foot fractures Contine scheduled tramadol   *Hypertension Stable on current regiment  * Weakness Stable Also unable to bear any weight on left foot due to fracture and cellulitis  *Acute on chronic poor appetite Improved Continue Megace bid Consulted dietary  *Acute constipation Resolved Continue Senokot at bedtime   All the records are reviewed and case discussed with Care Management/Social Workerr. Management plans discussed with the patient, family and they are in agreement.  CODE STATUS: dnr TOTAL TIME TAKING CARE OF THIS PATIENT: 35 minutes.     POSSIBLE D/C IN 1-3 DAYS, DEPENDING ON CLINICAL CONDITION.   Avel Peace Maxie Slovacek M.D on 04/02/2017   Between 7am to 6pm - Pager - [phone]  After 6pm go to [email or web address] - password [email or web address] [facility]  Office  [phone]  CC: Primary care physician; Idelle Crouch, MD  Note: This dictation was prepared with Dragon dictation along with smaller phrase technology. Any transcriptional errors that result from this process are unintentional.

## 2017-04-03 DIAGNOSIS — Z853 Personal history of malignant neoplasm of breast: Secondary | ICD-10-CM | POA: Diagnosis not present

## 2017-04-03 DIAGNOSIS — E119 Type 2 diabetes mellitus without complications: Secondary | ICD-10-CM | POA: Diagnosis not present

## 2017-04-03 DIAGNOSIS — M48062 Spinal stenosis, lumbar region with neurogenic claudication: Secondary | ICD-10-CM | POA: Diagnosis not present

## 2017-04-03 DIAGNOSIS — M069 Rheumatoid arthritis, unspecified: Secondary | ICD-10-CM | POA: Diagnosis not present

## 2017-04-03 DIAGNOSIS — L089 Local infection of the skin and subcutaneous tissue, unspecified: Secondary | ICD-10-CM | POA: Diagnosis not present

## 2017-04-03 DIAGNOSIS — Z7982 Long term (current) use of aspirin: Secondary | ICD-10-CM | POA: Diagnosis not present

## 2017-04-03 DIAGNOSIS — S6991XD Unspecified injury of right wrist, hand and finger(s), subsequent encounter: Secondary | ICD-10-CM | POA: Diagnosis not present

## 2017-04-03 DIAGNOSIS — I272 Pulmonary hypertension, unspecified: Secondary | ICD-10-CM | POA: Diagnosis not present

## 2017-04-03 DIAGNOSIS — Z8249 Family history of ischemic heart disease and other diseases of the circulatory system: Secondary | ICD-10-CM | POA: Diagnosis not present

## 2017-04-03 DIAGNOSIS — A419 Sepsis, unspecified organism: Secondary | ICD-10-CM | POA: Diagnosis not present

## 2017-04-03 DIAGNOSIS — L03115 Cellulitis of right lower limb: Secondary | ICD-10-CM | POA: Diagnosis not present

## 2017-04-03 DIAGNOSIS — M810 Age-related osteoporosis without current pathological fracture: Secondary | ICD-10-CM | POA: Diagnosis not present

## 2017-04-03 DIAGNOSIS — F3341 Major depressive disorder, recurrent, in partial remission: Secondary | ICD-10-CM | POA: Diagnosis not present

## 2017-04-03 DIAGNOSIS — S82891D Other fracture of right lower leg, subsequent encounter for closed fracture with routine healing: Secondary | ICD-10-CM | POA: Diagnosis not present

## 2017-04-03 DIAGNOSIS — R262 Difficulty in walking, not elsewhere classified: Secondary | ICD-10-CM | POA: Diagnosis not present

## 2017-04-03 DIAGNOSIS — Z882 Allergy status to sulfonamides status: Secondary | ICD-10-CM | POA: Diagnosis not present

## 2017-04-03 DIAGNOSIS — N179 Acute kidney failure, unspecified: Secondary | ICD-10-CM | POA: Diagnosis not present

## 2017-04-03 DIAGNOSIS — I10 Essential (primary) hypertension: Secondary | ICD-10-CM | POA: Diagnosis not present

## 2017-04-03 DIAGNOSIS — E785 Hyperlipidemia, unspecified: Secondary | ICD-10-CM | POA: Diagnosis not present

## 2017-04-03 DIAGNOSIS — F0390 Unspecified dementia without behavioral disturbance: Secondary | ICD-10-CM | POA: Diagnosis not present

## 2017-04-03 DIAGNOSIS — Z880 Allergy status to penicillin: Secondary | ICD-10-CM | POA: Diagnosis not present

## 2017-04-03 DIAGNOSIS — M6281 Muscle weakness (generalized): Secondary | ICD-10-CM | POA: Diagnosis not present

## 2017-04-03 DIAGNOSIS — Z8781 Personal history of (healed) traumatic fracture: Secondary | ICD-10-CM | POA: Diagnosis not present

## 2017-04-03 DIAGNOSIS — M0600 Rheumatoid arthritis without rheumatoid factor, unspecified site: Secondary | ICD-10-CM | POA: Diagnosis not present

## 2017-04-03 DIAGNOSIS — S92411D Displaced fracture of proximal phalanx of right great toe, subsequent encounter for fracture with routine healing: Secondary | ICD-10-CM | POA: Diagnosis not present

## 2017-04-03 DIAGNOSIS — I129 Hypertensive chronic kidney disease with stage 1 through stage 4 chronic kidney disease, or unspecified chronic kidney disease: Secondary | ICD-10-CM | POA: Diagnosis not present

## 2017-04-03 DIAGNOSIS — Z9181 History of falling: Secondary | ICD-10-CM | POA: Diagnosis not present

## 2017-04-03 DIAGNOSIS — S92414D Nondisplaced fracture of proximal phalanx of right great toe, subsequent encounter for fracture with routine healing: Secondary | ICD-10-CM | POA: Diagnosis not present

## 2017-04-03 DIAGNOSIS — L97512 Non-pressure chronic ulcer of other part of right foot with fat layer exposed: Secondary | ICD-10-CM | POA: Diagnosis not present

## 2017-04-03 DIAGNOSIS — Z885 Allergy status to narcotic agent status: Secondary | ICD-10-CM | POA: Diagnosis not present

## 2017-04-03 DIAGNOSIS — Z79899 Other long term (current) drug therapy: Secondary | ICD-10-CM | POA: Diagnosis not present

## 2017-04-03 DIAGNOSIS — L02611 Cutaneous abscess of right foot: Secondary | ICD-10-CM | POA: Diagnosis not present

## 2017-04-03 DIAGNOSIS — Z6825 Body mass index (BMI) 25.0-25.9, adult: Secondary | ICD-10-CM | POA: Diagnosis not present

## 2017-04-03 DIAGNOSIS — K219 Gastro-esophageal reflux disease without esophagitis: Secondary | ICD-10-CM | POA: Diagnosis not present

## 2017-04-03 DIAGNOSIS — Z888 Allergy status to other drugs, medicaments and biological substances status: Secondary | ICD-10-CM | POA: Diagnosis not present

## 2017-04-03 DIAGNOSIS — Z7401 Bed confinement status: Secondary | ICD-10-CM | POA: Diagnosis not present

## 2017-04-03 DIAGNOSIS — R630 Anorexia: Secondary | ICD-10-CM | POA: Diagnosis not present

## 2017-04-03 MED ORDER — MELOXICAM 7.5 MG PO TABS: 7.5000 mg | ORAL_TABLET | Freq: Every day | ORAL | 0 refills | Status: DC

## 2017-04-03 MED ORDER — DOXYCYCLINE HYCLATE 100 MG PO TABS: 100.0000 mg | ORAL_TABLET | Freq: Two times a day (BID) | ORAL | 0 refills | Status: DC

## 2017-04-03 MED ORDER — TRAMADOL HCL 50 MG PO TABS: 50.0000 mg | ORAL_TABLET | Freq: Four times a day (QID) | ORAL | 0 refills | Status: DC | PRN

## 2017-04-03 MED ORDER — DOXYCYCLINE HYCLATE 100 MG PO TABS: 100.0000 mg | ORAL_TABLET | Freq: Two times a day (BID) | ORAL | Status: DC

## 2017-04-03 MED ORDER — MEGESTROL ACETATE 400 MG/10ML PO SUSP: 400.0000 mg | Freq: Every day | ORAL | 0 refills | Status: DC

## 2017-04-03 MED ORDER — OXYCODONE HCL 5 MG PO TABS: 5.0000 mg | ORAL_TABLET | ORAL | 0 refills | Status: DC | PRN

## 2017-04-03 NOTE — Discharge Summary (Signed)
[facility] NAME: [name] [name]    MR#:  [id number]  DATE OF BIRTH:  14-Sep-1930  DATE OF ADMISSION:  03/26/2017 ADMITTING PHYSICIAN: Hillary Bow, MD  DATE OF DISCHARGE: No discharge date for patient encounter.  PRIMARY CARE PHYSICIAN: Idelle Crouch, MD    ADMISSION DIAGNOSIS:  Cellulitis of right lower extremity [Q30.092]  DISCHARGE DIAGNOSIS:  Active Problems:   Cellulitis of right foot   SECONDARY DIAGNOSIS:   Past Medical History:  Diagnosis Date  . breast cancer   . Hypertension     HOSPITAL COURSE:  *Acute deep-seated right foot infection  Resolving No improvement on cefazolin  MRI negative for osteomyelitis  Podiatry input appreciated  Treated empirically with course of IV vancomycin/Rocephin, evaluated by infectious disease-patient will be transitioned to doxycycline twice daily for another 7 days, patient has done well  *Acute pain syndrome Improved current regiment Secondary to above as well as foot fractures  *Hypertension Stable on current regiment  * Weakness Stable Also unable to bear any weight on left foot due to fracture and cellulitis Will be discharged back to inpatient rehab status post discharge for continued care/management  *Acute on chronic poor appetite Improved ContinueMegace bid Consulteddietary while in house  *Acute constipation Resolved ContinueSenokot at bedtime  DISCHARGE CONDITIONS:  On day of discharge patient is afebrile, hemodynamically stable, tolerating diet, ready for discharge to home with appropriate follow-up with podiatry in 1 week status post discharge for reevaluation of foot fractures, for more specific details please see chart  CONSULTS OBTAINED:  Treatment Team:  Gorden Harms, MD Leonel Ramsay, MD  DRUG ALLERGIES:   Allergies  Allergen Reactions  . Atorvastatin Other (See Comments)    Myositis  . Bisphosphonates Other  (See Comments)    GI upset  . Codeine Other (See Comments)  . Memantine Other (See Comments)  . Omeprazole Nausea Only  . Other Other (See Comments)    Leg cramps GI upset  . Salsalate Other (See Comments)  . Sulfa Antibiotics     Pt doesn't remember  . Penicillins Itching    Has patient had a PCN reaction causing immediate rash, facial/tongue/throat swelling, SOB or lightheadedness with hypotension: Yes Has patient had a PCN reaction causing severe rash involving mucus membranes or skin necrosis: No Has patient had a PCN reaction that required hospitalization: No Has patient had a PCN reaction occurring within the last 10 years: No If all of the above answers are "NO", then may proceed with Cephalosporin use.    DISCHARGE MEDICATIONS:   Allergies as of 04/03/2017      Reactions   Atorvastatin Other (See Comments)   Myositis   Bisphosphonates Other (See Comments)   GI upset   Codeine Other (See Comments)   Memantine Other (See Comments)   Omeprazole Nausea Only   Other Other (See Comments)   Leg cramps GI upset   Salsalate Other (See Comments)   Sulfa Antibiotics    Pt doesn't remember   Penicillins Itching   Has patient had a PCN reaction causing immediate rash, facial/tongue/throat swelling, SOB or lightheadedness with hypotension: Yes Has patient had a PCN reaction causing severe rash involving mucus membranes or skin necrosis: No Has patient had a PCN reaction that required hospitalization: No Has patient had a PCN reaction occurring within the last 10 years: No If all of the above answers are "NO", then may proceed with Cephalosporin use.  Medication List    STOP taking these medications   cefpodoxime 100 MG tablet Commonly known as:  VANTIN   oxyCODONE-acetaminophen 5-325 MG tablet Commonly known as:  PERCOCET     TAKE these medications   acetaminophen 650 MG CR tablet Commonly known as:  TYLENOL Take 1,300 mg by mouth 2 (two) times daily.    amLODipine 10 MG tablet Commonly known as:  NORVASC Take 1 tablet by mouth daily.   aspirin EC 81 MG tablet Take 81 mg by mouth daily.   doxycycline 100 MG tablet Commonly known as:  VIBRA-TABS Take 1 tablet (100 mg total) by mouth every 12 (twelve) hours.   folic acid 1 MG tablet Commonly known as:  FOLVITE Take 1 mg by mouth daily.   isosorbide mononitrate 30 MG 24 hr tablet Commonly known as:  IMDUR Take 30 mg by mouth daily.   isosorbide mononitrate 60 MG 24 hr tablet Commonly known as:  IMDUR Take 1 tablet (60 mg total) by mouth daily.   losartan 100 MG tablet Commonly known as:  COZAAR Take 100 mg by mouth daily.   megestrol 400 MG/10ML suspension Commonly known as:  MEGACE Take 10 mLs (400 mg total) by mouth daily. Start taking on:  04/04/2017   meloxicam 7.5 MG tablet Commonly known as:  MOBIC Take 1 tablet (7.5 mg total) by mouth daily. Start taking on:  04/04/2017   methotrexate 2.5 MG tablet Commonly known as:  RHEUMATREX Take 6 tablets by mouth every Friday.   metoprolol tartrate 100 MG tablet Commonly known as:  LOPRESSOR Take 100 mg by mouth 2 (two) times daily.   MULTI-VITAMINS Tabs Take 1 tablet by mouth daily.   NEOMYCIN-POLYMYXIN-HYDROCORTISONE 1 % Soln OTIC solution Commonly known as:  CORTISPORIN Apply 1-2 drops to toe BID after soaking   oxyCODONE 5 MG immediate release tablet Commonly known as:  Oxy IR/ROXICODONE Take 1 tablet (5 mg total) by mouth every 4 (four) hours as needed for severe pain.   sertraline 100 MG tablet Commonly known as:  ZOLOFT Take 100 mg by mouth daily.   traMADol 50 MG tablet Commonly known as:  ULTRAM Take 1 tablet (50 mg total) by mouth every 6 (six) hours as needed for moderate pain. What changed:  reasons to take this   traZODone 50 MG tablet Commonly known as:  DESYREL Take 50 mg by mouth at bedtime.   vitamin C 1000 MG tablet Take 1,000 mg by mouth daily.        DISCHARGE INSTRUCTIONS:     If you experience worsening of your admission symptoms, develop shortness of breath, life threatening emergency, suicidal or homicidal thoughts you must seek medical attention immediately by calling 911 or calling your MD immediately  if symptoms less severe.  You Must read complete instructions/literature along with all the possible adverse reactions/side effects for all the Medicines you take and that have been prescribed to you. Take any new Medicines after you have completely understood and accept all the possible adverse reactions/side effects.   Please note  You were cared for by a hospitalist during your hospital stay. If you have any questions about your discharge medications or the care you received while you were in the hospital after you are discharged, you can call the unit and asked to speak with the hospitalist on call if the hospitalist that took care of you is not available. Once you are discharged, your primary care physician will handle any further medical issues. Please  note that NO REFILLS for any discharge medications will be authorized once you are discharged, as it is imperative that you return to your primary care physician (or establish a relationship with a primary care physician if you do not have one) for your aftercare needs so that they can reassess your need for medications and monitor your lab values.    Today   CHIEF COMPLAINT:   Chief Complaint  Patient presents with  . Foot Pain    HISTORY OF PRESENT ILLNESS:  84 y.o. female with a known history of hypertension, breast cancer, DNR who was recently here in the emergency room on 03/20/2017 and was diagnosed with right foot fracture along with some sutures placed.  He was discharged home and has been working with physical therapy at [facility]  Today she was seen by her primary care physician and was found to have redness, swelling of the right foot along with white count elevated at 22,000 and  sent to the emergency room.  Afebrile.  Patient does feel fatigued.  She has some mild cognitive impairment at baseline according to family but has been worse since getting started on Percocet.  Presently she is alert and oriented. Overall poor historian.  Old records reviewed.  Also history obtained from family at bedside.  X-ray of the right foot shows no osteomyelitis.     VITAL SIGNS:  Blood pressure (!) 154/47, pulse 71, temperature 98.4 F (36.9 C), temperature source Oral, resp. rate 16, height 5' 3.5" (1.613 m), weight 68.5 kg (151 lb), SpO2 95 %.  I/O:    Intake/Output Summary (Last 24 hours) at 04/03/2017 1201 Last data filed at 04/03/2017 1005 Gross per 24 hour  Intake 600 ml  Output -  Net 600 ml    PHYSICAL EXAMINATION:  GENERAL:  84 y.o.-year-old patient lying in the bed with no acute distress.  EYES: Pupils equal, round, reactive to light and accommodation. No scleral icterus. Extraocular muscles intact.  HEENT: Head atraumatic, normocephalic. Oropharynx and nasopharynx clear.  NECK:  Supple, no jugular venous distention. No thyroid enlargement, no tenderness.  LUNGS: Normal breath sounds bilaterally, no wheezing, rales,rhonchi or crepitation. No use of accessory muscles of respiration.  CARDIOVASCULAR: S1, S2 normal. No murmurs, rubs, or gallops.  ABDOMEN: Soft, non-tender, non-distended. Bowel sounds present. No organomegaly or mass.  EXTREMITIES: No pedal edema, cyanosis, or clubbing.  NEUROLOGIC: Cranial nerves II through XII are intact. Muscle strength 5/5 in all extremities. Sensation intact. Gait not checked.  PSYCHIATRIC: The patient is alert and oriented x 3.  SKIN: No obvious rash, lesion, or ulcer.   DATA REVIEW:   CBC Recent Labs  Lab 03/31/17 0324  WBC 13.5*  HGB 11.8*  HCT 35.8  PLT 345    Chemistries  Recent Labs  Lab 03/31/17 0324 04/02/17 0121  NA 137  --   K 3.3*  --   CL 104  --   CO2 24  --   GLUCOSE 105*  --   BUN 19  --    CREATININE 0.63 0.60  CALCIUM 8.3*  --     Cardiac Enzymes No results for input(s): TROPONINI in the last 168 hours.  Microbiology Results  Results for orders placed or performed during the hospital encounter of 03/26/17  Blood culture (routine x 2)     Status: None   Collection Time: 03/26/17 11:51 AM  Result Value Ref Range Status   Specimen Description BLOOD LFOA  Final   Special Requests  Final    BOTTLES DRAWN AEROBIC AND ANAEROBIC Blood Culture adequate volume   Culture   Final    NO GROWTH 5 DAYS Performed at [facility], [address] [place], [place] [address]    Report Status 03/31/2017 FINAL  Final  Blood culture (routine x 2)     Status: None   Collection Time: 03/26/17 11:51 AM  Result Value Ref Range Status   Specimen Description BLOOD LEFT HAND  Final   Special Requests   Final    BOTTLES DRAWN AEROBIC AND ANAEROBIC Blood Culture results may not be optimal due to an excessive volume of blood received in culture bottles   Culture   Final    NO GROWTH 5 DAYS Performed at [facility], [address] [place], [place] [address]    Report Status 03/31/2017 FINAL  Final  MRSA PCR Screening     Status: None   Collection Time: 03/26/17  5:31 PM  Result Value Ref Range Status   MRSA by PCR NEGATIVE NEGATIVE Final    Comment:        The GeneXpert MRSA Assay (FDA approved for NASAL specimens only), is one component of a comprehensive MRSA colonization surveillance program. It is not intended to diagnose MRSA infection nor to guide or monitor treatment for MRSA infections. Performed at [facility], [address] [place], [place] [address]   Aerobic/Anaerobic Culture (surgical/deep wound)     Status: None (Preliminary result)   Collection Time: 04/01/17 12:38 PM  Result Value Ref Range Status   Specimen Description WOUND FOOT RIGHT  Final   Special Requests   Final    Normal Performed at [facility], [address] [place], [place] [address]    Gram Stain   Final    FEW WBC PRESENT, PREDOMINANTLY PMN NO ORGANISMS SEEN    Culture   Final    NO GROWTH < 24 HOURS Performed at [facility] [address], [place], [place] [address]    Report Status PENDING  Incomplete    RADIOLOGY:  No results found.  EKG:   Orders placed or performed during the hospital encounter of 06/23/16  . ED EKG  . ED EKG  . EKG 12-Lead  . EKG 12-Lead  . EKG 12-Lead  . EKG 12-Lead      Management plans discussed with the patient, family and they are in agreement.  CODE STATUS:     Code Status Orders  (From admission, onward)        Start     Ordered   03/26/17 1315  Do not attempt resuscitation (DNR)  Continuous    Question Answer Comment  In the event of cardiac or respiratory ARREST Do not call a "code blue"   In the event of cardiac or respiratory ARREST Do not perform Intubation, CPR, defibrillation or ACLS   In the event of cardiac or respiratory ARREST Use medication by any route, position, wound care, and other measures to relive pain and suffering. May use oxygen, suction and manual treatment of airway obstruction as needed for comfort.      03/26/17 1319    Code Status History    Date Active Date Inactive Code Status Order ID Comments User Context   11/09/2015 13:04 11/10/2015 18:05 Full Code [id number]  Fritzi Mandes, MD Inpatient    Advance Directive Documentation     Most Recent Value  Type of Advance Directive  Healthcare Power of [place]  Pre-existing out of facility DNR order (yellow form or pink MOST form)  No data  "MOST" Form in Place?  No data      TOTAL TIME TAKING CARE OF THIS PATIENT: 45 minutes.    Avel Peace Yorel Redder M.D on 04/03/2017 at 12:01 PM  Between 7am to 6pm - Pager - [phone]  After 6pm go to [email or web address] - password [email or web address] [facility]  Office  [phone]  CC: Primary care physician; Idelle Crouch, MD   Note: This  dictation was prepared with Dragon dictation along with smaller phrase technology. Any transcriptional errors that result from this process are unintentional.

## 2017-04-03 NOTE — Care Management Important Message (Signed)
Important Message  Patient Details  Name: [name] [name] MRN: [id number] Date of Birth: Feb 09, 1931   Medicare Important Message Given:  Yes    Beverly Sessions, RN 04/03/2017, 1:58 PM

## 2017-04-03 NOTE — Progress Notes (Signed)
Patient is medically stable for D/C to [facility] today. Per [facility] admissions coordinator at [facility] patient can come today to room 208-A. RN will call report at [phone] and arrange EMS for transport. Clinical Education officer, museum (CSW) sent D/C orders to [facility] via [employer]. Patient is aware of above. CSW contacted patient's sister [name] and made her aware of above. [facility] authorization has been received. CSW notified [facility] case manager of D/C today. Please reconsult if future social work needs arise. CSW signing off.   [employer], LCSW [phone]

## 2017-04-03 NOTE — Progress Notes (Signed)
ID E note No fevers, cx still neg  Rec If stable would dc on oral doxycycline 100 mg bid (she is PCN and sulfa allergic) for 7 more days. She should elevate the leg as much as possible to control swelling. Discuss with podiatry further recs for the foot fractures.

## 2017-04-03 NOTE — Progress Notes (Signed)
Pt is in no acute distress at this time. VSS. IV removed. RN called report to RN at [facility] facility. EMS called to transport patient.

## 2017-04-04 ENCOUNTER — Other Ambulatory Visit: Payer: Self-pay

## 2017-04-04 MED ORDER — TRAMADOL HCL 50 MG PO TABS: 50.0000 mg | ORAL_TABLET | Freq: Four times a day (QID) | ORAL | 0 refills | Status: DC | PRN

## 2017-04-04 MED ORDER — OXYCODONE HCL 5 MG PO TABS: 5.0000 mg | ORAL_TABLET | ORAL | 0 refills | Status: DC | PRN

## 2017-04-04 NOTE — Telephone Encounter (Signed)
Rx sent to [facility] phone : [phone] , fax : [phone]  

## 2017-04-05 ENCOUNTER — Non-Acute Institutional Stay (SKILLED_NURSING_FACILITY): Payer: PPO | Admitting: Gerontology

## 2017-04-05 ENCOUNTER — Encounter: Payer: Self-pay | Admitting: Gerontology

## 2017-04-05 DIAGNOSIS — L03115 Cellulitis of right lower limb: Secondary | ICD-10-CM

## 2017-04-05 DIAGNOSIS — S82891D Other fracture of right lower leg, subsequent encounter for closed fracture with routine healing: Secondary | ICD-10-CM | POA: Diagnosis not present

## 2017-04-05 NOTE — Progress Notes (Signed)
Location:   [facility] Room Number: [place] of Service:  SNF [phone]) Provider:  Toni Arthurs, NP-C  [place]parks, [name], MD  [name], MD as PCP - General (Internal Medicine)  Extended Emergency Contact Information Primary Emergency Contact: [name],[name] Address: [place] Phone: [phone] Work Phone: [phone] Relation: Sister Secondary Emergency Contact: Lower, Belva Chimes Phone: [phone] Relation: Neighbor  Code Status:  DNR Goals of care: Advanced Directive information Advanced Directives 04/05/2017  Does Patient Have a Medical Advance Directive? Yes  Type of Advance Directive [place]  Does patient want to make changes to medical advance directive? No - Patient declined  Copy of [place] in Chart? Yes  Would patient like information on creating a medical advance directive? -     Chief Complaint  Patient presents with  . Acute Visit    Recheck cellulitis on foot    HPI:  Pt is a 84 y.o. female seen today for an acute visit for re-evaluation of the cellulitis of the right foot. Pt was readmitted to the facility after hospitalization for cellulitis of the right foot. Pt was sent to the ED for evaluation last week for concern of vascular compromise in the foot after ankle fracture. Pt was treated with IV antibiotics and evaluated by podiatry. She returned to the facility with continued IV antibiotics. Today, pt c/o worsening pain in the right foot. Pt screams and withdrawals to light touch of the foot. Pulse is not palpable. However, pulse is present with doppler. Dorsal aspect of the right foot is deep red and warm. Distal area the the midfoot/MTP junction is a deeper red/purple appearing. Mild edema in the toes. Cap refills in the toes WNL. 2 cm diameter wound on distal/dorsal forefoot with purulent drainage. VSS. No  other complaints.   Please note pt with limited verbal ability. Unable to obtain complete ROS. Some ROS info obtained from staff and documentation.   Past Medical History:  Diagnosis Date  . breast cancer   . Fibrocystic breast disease    Severe; status post bilateral mastectomies and implants  . GERD (gastroesophageal reflux disease)    Severe; manifested vocal cord irritation, status-post Nissen fundcoplication in 06/3746  . History of ataxia    with questionable right upper and lower  . History of breast cancer   . History of exertional chest pain    with her last stress echo in 09/1999 being normal  . History of migraine    vascular  . History of shingles   . History of syncope 12/1999   With negative cardiac workup to include an echocardiogram, which revealed only mild to moderate MR, a CT scan of the chest, which was negative, and a Cardiolite stress test, which was normal  . Hyperlipidemia, unspecified   . Hypertension    CTA 05/2004 showed no renovascular cause  . Mild dementia   . Muscle atrophy    Positive FANA and anti- SCL70 antibodies; S/p biopsy without eosinic fascitis; Remicade discontinued  . Osteoarthritis    a. Cervical spine. b. Lumbar spine/spinal stenosis. [facility])  . Osteoporosis    a. Fosamax b. Right wrist fracture. c. Sternal fracture ([place])  . Rheumatoid arthritis without elevated rheumatoid factor (HCC)    a. Methotrexate. b. S/p Remicade. c. Seronegative. d. S/p Plaquenil. [facility])   Past Surgical History:  Procedure Laterality Date  . APPENDECTOMY    . CHOLECYSTECTOMY    . LAPAROSCOPIC NISSEN FUNDOPLICATION  10/3816  . LUMBAR LAMINECTOMY     for spinal stenosis  . MASTECTOMY Bilateral    with implants  . ORIF DISTAL RADIUS FRACTURE Right 07/27/2008   with volar plate  . TOTAL ABDOMINAL HYSTERECTOMY W/ BILATERAL SALPINGOOPHORECTOMY    . VASCULAR SURGERY      Allergies  Allergen Reactions  . Atorvastatin Other (See Comments)    Myositis  .  Bisphosphonates Other (See Comments)    GI upset  . Codeine Other (See Comments)  . Memantine Other (See Comments)  . Omeprazole Nausea Only  . Other Other (See Comments)    Leg cramps GI upset  . Salsalate Other (See Comments)  . Sulfa Antibiotics     Pt doesn't remember  . Penicillins Itching    Has patient had a PCN reaction causing immediate rash, facial/tongue/throat swelling, SOB or lightheadedness with hypotension: Yes Has patient had a PCN reaction causing severe rash involving mucus membranes or skin necrosis: No Has patient had a PCN reaction that required hospitalization: No Has patient had a PCN reaction occurring within the last 10 years: No If all of the above answers are "NO", then may proceed with Cephalosporin use.    Allergies as of 04/05/2017      Reactions   Atorvastatin Other (See Comments)   Myositis   Bisphosphonates Other (See Comments)   GI upset   Codeine Other (See Comments)   Memantine Other (See Comments)   Omeprazole Nausea Only   Other Other (See Comments)   Leg cramps GI upset   Salsalate Other (See Comments)   Sulfa Antibiotics    Pt doesn't remember   Penicillins Itching   Has patient had a PCN reaction causing immediate rash, facial/tongue/throat swelling, SOB or lightheadedness with hypotension: Yes Has patient had a PCN reaction causing severe rash involving mucus membranes or skin necrosis: No Has patient had a PCN reaction that required hospitalization: No Has patient had a PCN reaction occurring within the last 10 years: No If all of the above answers are "NO", then may proceed with Cephalosporin use.      Medication List        Accurate as of 04/05/17  3:27 PM. Always use your most recent med list.          acetaminophen 650 MG CR tablet Commonly known as:  TYLENOL Take 1,300 mg by mouth 2 (two) times daily.   amLODipine 5 MG tablet Commonly known as:  NORVASC Take 10 mg by mouth daily. 2 tabs   aspirin EC 81 MG  tablet Take 81 mg by mouth daily.   doxycycline 100 MG tablet Commonly known as:  VIBRA-TABS Take 1 tablet (100 mg total) by mouth every 12 (twelve) hours.   folic acid 1 MG tablet Commonly known as:  FOLVITE Take 1 mg by mouth daily.   isosorbide mononitrate 30 MG 24 hr tablet Commonly known as:  IMDUR Take 30 mg by mouth daily.   losartan 100 MG tablet Commonly known as:  COZAAR Take 100 mg by mouth daily.   meloxicam 7.5 MG tablet Commonly known as:  MOBIC Take 1 tablet (7.5 mg total) by mouth daily.   methotrexate 2.5 MG tablet Commonly known as:  RHEUMATREX Take 6 tablets by mouth every Friday.   metoprolol tartrate 100 MG tablet Commonly known as:  LOPRESSOR Take 100 mg by mouth 2 (two) times  daily.   mirtazapine 7.5 MG tablet Commonly known as:  REMERON Take 7.5 mg by mouth at bedtime.   MULTI-VITAMINS Tabs Take 1 tablet by mouth daily.   oxyCODONE 5 MG immediate release tablet Commonly known as:  Oxy IR/ROXICODONE Take 1 tablet (5 mg total) by mouth every 4 (four) hours as needed for severe pain.   sertraline 100 MG tablet Commonly known as:  ZOLOFT Take 100 mg by mouth daily.   traMADol 50 MG tablet Commonly known as:  ULTRAM Take 1 tablet (50 mg total) by mouth every 6 (six) hours as needed for moderate pain.   vitamin C 1000 MG tablet Take 1,000 mg by mouth daily.       Review of Systems  Unable to perform ROS: Dementia  Constitutional: Negative for activity change, appetite change, chills, diaphoresis and fever.  HENT: Negative for congestion, mouth sores, nosebleeds, postnasal drip, sneezing, sore throat, trouble swallowing and voice change.   Respiratory: Negative for apnea, cough, choking, chest tightness, shortness of breath and wheezing.   Cardiovascular: Negative for chest pain, palpitations and leg swelling.  Gastrointestinal: Negative for abdominal distention, abdominal pain, constipation, diarrhea and nausea.  Genitourinary: Negative  for difficulty urinating, dysuria, frequency and urgency.  Musculoskeletal: Positive for arthralgias (typical arthritis) and myalgias. Negative for back pain and gait problem.  Skin: Positive for wound. Negative for color change, pallor and rash.  Neurological: Negative for dizziness, tremors, syncope, speech difficulty, weakness, numbness and headaches.  Psychiatric/Behavioral: Negative for agitation and behavioral problems.  All other systems reviewed and are negative.   Immunization History  Administered Date(s) Administered  . Influenza-Unspecified 11/27/2013  . Pneumococcal Polysaccharide-23 03/27/2013   Pertinent  Health Maintenance Due  Topic Date Due  . DEXA SCAN  04/28/1997  . PNA vac Low Risk Adult (2 of 2 - PCV13) 03/27/2014  . INFLUENZA VACCINE  09/27/2016   No flowsheet data found. Functional Status Survey:    Vitals:   04/05/17 1451  BP: (!) 147/47  Pulse: 93  Resp: 18  Temp: 98.9 F (37.2 C)  TempSrc: Oral  SpO2: 96%  Weight: 147 lb (66.7 kg)  Height: 5' 5"  (1.651 m)   Body mass index is 24.46 kg/m. Physical Exam  Constitutional: Vital signs are normal. She appears well-developed and well-nourished. She is active and cooperative. She does not appear ill. No distress.  HENT:  Head: Normocephalic and atraumatic.  Mouth/Throat: Uvula is midline, oropharynx is clear and moist and mucous membranes are normal. Mucous membranes are not pale, not dry and not cyanotic.  Eyes: Pupils are equal, round, and reactive to light. Conjunctivae, EOM and lids are normal.  Neck: Trachea normal, normal range of motion and full passive range of motion without pain. Neck supple. No JVD present. No tracheal deviation, no edema and no erythema present. No thyromegaly present.  Cardiovascular: Normal rate, regular rhythm, normal heart sounds, intact distal pulses and normal pulses. Exam reveals no gallop, no distant heart sounds and no friction rub.  No murmur heard. Pulses:       Dorsalis pedis pulses are 2+ on the right side, and 2+ on the left side.  No edema  Pulmonary/Chest: Effort normal and breath sounds normal. No accessory muscle usage. No respiratory distress. She has no decreased breath sounds. She has no wheezes. She has no rhonchi. She has no rales. She exhibits no tenderness.  Abdominal: Soft. Normal appearance and bowel sounds are normal. She exhibits no distension and no ascites. There is no  tenderness.  Musculoskeletal: She exhibits no edema.       Right ankle: She exhibits decreased range of motion, swelling and laceration. Tenderness.  Expected osteoarthritis, stiffness; Bilateral Calves soft, supple. Negative Homan's Sign. B- pedal pulses equal  Neurological: She is alert. She has normal strength.  Skin: Skin is warm, dry and intact. Ecchymosis noted. She is not diaphoretic. No cyanosis. No pallor. Nails show no clubbing.     Right ankle, ecchymosis, warmth. 2 cm wound with purulent drainage  Psychiatric: Her speech is normal and behavior is normal. Judgment and thought content normal. Her mood appears anxious. Cognition and memory are impaired.  Nursing note and vitals reviewed.   Labs reviewed: Recent Labs    03/26/17 1151 03/27/17 0323 03/31/17 0324 04/02/17 0121  NA 137 135 137  --   K 4.0 3.9 3.3*  --   CL 103 104 104  --   CO2 25 24 24   --   GLUCOSE 113* 116* 105*  --   BUN 19 14 19   --   CREATININE 0.66 0.46 0.63 0.60  CALCIUM 8.4* 8.2* 8.3*  --    Recent Labs    06/23/16 1807 03/22/17 0600  AST 25 27  ALT 14 17  ALKPHOS 66 63  BILITOT 0.3 1.1  PROT 6.4* 6.8  ALBUMIN 3.3* 3.7   Recent Labs    03/23/17 1435 03/26/17 0600 03/26/17 1151 03/27/17 0323 03/31/17 0324  WBC 11.6* 10.9 10.9 11.5* 13.5*  NEUTROABS 8.1* 7.6*  --   --  9.7*  HGB 11.9* 12.2 12.5 12.4 11.8*  HCT 36.6 36.8 37.1 36.7 35.8  MCV 118.7* 119.3* 118.9* 119.2* 117.5*  PLT 240 267 270 280 345   Lab Results  Component Value Date   TSH 1.412  06/23/2016   No results found for: HGBA1C No results found for: CHOL, HDL, LDLCALC, LDLDIRECT, TRIG, CHOLHDL  Significant Diagnostic Results in last 30 days:  Dg Chest 2 View  Result Date: 03/19/2017 CLINICAL DATA:  Elevated white cell count. History of breast cancer. EXAM: CHEST  2 VIEW COMPARISON:  Chest and CT chest 06/23/2016 FINDINGS: Heart size and pulmonary vascularity are normal probable emphysematous changes in the lungs. Peribronchial thickening consistent with chronic bronchitis. No airspace disease or consolidation. No blunting of costophrenic angles. No pneumothorax. Mediastinal contours appear intact. Calcification of the aorta. Degenerative changes in the spine. IMPRESSION: Mild emphysematous and chronic bronchitic changes in the lungs. No evidence of active pulmonary disease. Aortic atherosclerosis. Electronically Signed   By: Lucienne Capers M.D.   On: 03/19/2017 00:59   Dg Shoulder Right  Result Date: 03/18/2017 CLINICAL DATA:  [place] off of bus EXAM: RIGHT SHOULDER - 2+ VIEW COMPARISON:  None. FINDINGS: No fracture or malalignment. AC joint degenerative change. Mild glenohumeral degenerative change. Right lung apex is clear IMPRESSION: No acute osseous abnormality Electronically Signed   By: Donavan Foil M.D.   On: 03/18/2017 19:47   Dg Wrist Complete Right  Result Date: 03/18/2017 CLINICAL DATA:  [place] off bus, wrist deformity EXAM: RIGHT WRIST - COMPLETE 3+ VIEW COMPARISON:  None. FINDINGS: No acute fracture or malalignment. Patient is status post prior surgical plate and multiple screw fixation of the distal radius for old fracture. Marked arthritis at the first [facility] joint and at the distal scaphoid. No radiopaque foreign body. IMPRESSION: Postsurgical changes of the distal radius. No definite acute osseous abnormality Electronically Signed   By: Donavan Foil M.D.   On: 03/18/2017 19:40   Dg Ankle  2 Views Right  Result Date: 03/18/2017 CLINICAL DATA:  [place] off of bus EXAM:  RIGHT ANKLE - 2 VIEW COMPARISON:  None. FINDINGS: No fracture or malalignment. Dorsal spurs off the navicular and talus. Ankle mortise is symmetric. Gas in the dorsal soft tissues of the mid to distal foot IMPRESSION: 1. No acute osseous abnormality 2. Gas in the dorsal soft tissues of the mid to distal foot Electronically Signed   By: Donavan Foil M.D.   On: 03/18/2017 19:46   Ct Head Wo Contrast  Result Date: 03/18/2017 CLINICAL DATA:  Follow-up off of bus.  Neck pain. EXAM: CT HEAD WITHOUT CONTRAST CT CERVICAL SPINE WITHOUT CONTRAST TECHNIQUE: Multidetector CT imaging of the head and cervical spine was performed following the standard protocol without intravenous contrast. Multiplanar CT image reconstructions of the cervical spine were also generated. COMPARISON:  Head CT 11/26/2013 FINDINGS: CT HEAD FINDINGS Brain: No intracranial hemorrhage. No parenchymal contusion. No midline shift or mass effect. Basilar cisterns are patent. No skull base fracture. No fluid in the paranasal sinuses or mastoid air cells. Orbits are normal. There are periventricular and subcortical white matter hypodensities. Generalized cortical atrophy. Vascular: No hyperdense vessel or unexpected calcification. Skull: No skull fracture. Small scalp hematoma over the RIGHT frontal bone. The Sinuses/Orbits: No acute finding. Other: None CT CERVICAL SPINE FINDINGS Alignment: Normal alignment of the cervical vertebral bodies. Skull base and vertebrae: Normal craniocervical junction. No loss of vertebral body height or disc height. Normal facet articulation. No evidence of fracture. Soft tissues and spinal canal: No prevertebral soft tissue swelling. No perispinal or epidural hematoma. Disc levels:  Endplate spurring and disc space narrowing from C5-C7. Upper chest: Clear Other: None IMPRESSION: 1. No intracranial trauma. 2. Small RIGHT frontal scalp hematoma without fracture. 3. Chronic atrophy and white matter microvascular disease. 4. No  cervical spine fracture. 5. Mild multilevel disc osteophytic disease. Electronically Signed   By: Suzy Bouchard M.D.   On: 03/18/2017 19:26   Ct Cervical Spine Wo Contrast  Result Date: 03/18/2017 CLINICAL DATA:  Follow-up off of bus.  Neck pain. EXAM: CT HEAD WITHOUT CONTRAST CT CERVICAL SPINE WITHOUT CONTRAST TECHNIQUE: Multidetector CT imaging of the head and cervical spine was performed following the standard protocol without intravenous contrast. Multiplanar CT image reconstructions of the cervical spine were also generated. COMPARISON:  Head CT 11/26/2013 FINDINGS: CT HEAD FINDINGS Brain: No intracranial hemorrhage. No parenchymal contusion. No midline shift or mass effect. Basilar cisterns are patent. No skull base fracture. No fluid in the paranasal sinuses or mastoid air cells. Orbits are normal. There are periventricular and subcortical white matter hypodensities. Generalized cortical atrophy. Vascular: No hyperdense vessel or unexpected calcification. Skull: No skull fracture. Small scalp hematoma over the RIGHT frontal bone. The Sinuses/Orbits: No acute finding. Other: None CT CERVICAL SPINE FINDINGS Alignment: Normal alignment of the cervical vertebral bodies. Skull base and vertebrae: Normal craniocervical junction. No loss of vertebral body height or disc height. Normal facet articulation. No evidence of fracture. Soft tissues and spinal canal: No prevertebral soft tissue swelling. No perispinal or epidural hematoma. Disc levels:  Endplate spurring and disc space narrowing from C5-C7. Upper chest: Clear Other: None IMPRESSION: 1. No intracranial trauma. 2. Small RIGHT frontal scalp hematoma without fracture. 3. Chronic atrophy and white matter microvascular disease. 4. No cervical spine fracture. 5. Mild multilevel disc osteophytic disease. Electronically Signed   By: Suzy Bouchard M.D.   On: 03/18/2017 19:26   Dg Hand 2 View Right  Result  Date: 03/18/2017 CLINICAL DATA:  [place] off bus  with wrist deformity EXAM: RIGHT HAND - 2 VIEW COMPARISON:  None. FINDINGS: No fracture is seen. Radial deviation at the second DIP joint. Arthritis second through fifth DIP joints with small suspected erosions at the heads of the second third and fourth middle phalanges. Mild arthritis at the second third and fourth PIP joints. Moderate arthritis at the base of the first [facility] joint and radial aspect of the wrist. IMPRESSION: 1. No acute fracture is seen 2. Arthritis at the DIP and PIP joints as well as the radial aspect of the wrist; small erosive changes are suspected, query inflammatory arthritis Electronically Signed   By: Donavan Foil M.D.   On: 03/18/2017 19:42   Mr Foot Right Wo Contrast  Result Date: 03/30/2017 CLINICAL DATA:  Cellulitis of the toe.  Pain. EXAM: MRI OF THE RIGHT FOREFOOT WITHOUT CONTRAST TECHNIQUE: Multiplanar, multisequence MR imaging of the right forefoot was performed. No intravenous contrast was administered. COMPARISON:  None. FINDINGS: Bones/Joint/Cartilage Nondisplaced fracture at the lateral base of the first proximal phalanx with mild surrounding marrow edema. Nondisplaced oblique fracture of the distal shaft of the first metatarsal with surrounding marrow edema. Nondisplaced fracture of the second metatarsal neck with marrow edema in the second metatarsal shaft and second metatarsal head. Nondisplaced fracture of third metatarsal head. Severe marrow edema with a linear component involving the distal anterior calcaneus adjacent to the calcaneocuboid joint partially visualized most consistent with a nondisplaced fracture. No periosteal reaction or bone destruction. Normal alignment. No joint effusion. Ligaments Collateral ligaments are intact.  Lisfranc ligament is intact Muscles and Tendons Flexor, peroneal and extensor compartment tendons are intact. Muscles are normal. Soft tissue No fluid collection or hematoma. No soft tissue mass. Soft tissue edema along the dorsal aspect of  the foot which may be reactive secondary to venous insufficiency versus cellulitis. IMPRESSION: 1. Nondisplaced fracture at the lateral base of the first proximal phalanx with mild surrounding marrow edema. 2. Nondisplaced oblique fracture of the distal shaft of the first metatarsal with surrounding marrow edema. 3. Nondisplaced fracture of the second metatarsal neck with marrow edema in the second metatarsal shaft and second metatarsal head. 4. Nondisplaced fracture of third metatarsal head. 5. Nondisplaced fracture of the distal anterior calcaneus at the calcaneocuboid joint. 6. No evidence of osteomyelitis. Electronically Signed   By: Kathreen Devoid   On: 03/30/2017 16:29   Dg Foot 2 Views Right  Result Date: 03/26/2017 CLINICAL DATA:  History of a fall 8 days ago resulting in lacerations over the dorsum of the foot requiring stitches. Patient also had an acute fracture of the base of the proximal phalanx of the great toe. The patient now is complaining of increased foot pain, erythema, and swelling. Clinical suspicion of cellulitis. EXAM: RIGHT FOOT - 2 VIEW COMPARISON:  Right foot series of March 18, 2017 FINDINGS: The displaced fracture through the lateral aspect of the base of the fifth metatarsal is again demonstrated and appears stable. The second metatarsal neck fracture is not well demonstrated. The other phalanges and metatarsals are intact. The tarsal bones exhibit no acute abnormalities. There is mild soft tissue swelling over the forefoot and midfoot. No soft tissue gas collections or foreign bodies are observed. IMPRESSION: Mild soft tissue swelling may reflect cellulitis. No plain film evidence of osteomyelitis. There are post traumatic changes of the base of the proximal phalanx of the great toe and possibly of the neck of the second metatarsal as described.  Electronically Signed   By: David  Martinique M.D.   On: 03/26/2017 12:37   Dg Foot 2 Views Right  Result Date: 03/18/2017 CLINICAL DATA:   [place] off bus with bruising EXAM: RIGHT FOOT - 2 VIEW COMPARISON:  None. FINDINGS: Acute, mildly displaced intra-articular fracture at the base of the first proximal phalanx. Suspected nondisplaced fracture at the neck of the second metatarsal. No subluxation. Moderate gas within the dorsal soft tissues with soft tissue swelling present. IMPRESSION: 1. Acute displaced intra-articular fracture at the base of the first proximal phalanx with suspected nondisplaced fracture at the neck of the second metatarsal 2. Dorsal soft tissue swelling with moderate soft tissue gas; correlate with physical exam for open wound or laceration Electronically Signed   By: Donavan Foil M.D.   On: 03/18/2017 19:44    Assessment/Plan  Cellulitis of right foot  Cleanse foot with NS  Cover wound with Silver Alginate dressing  Cover with 4x4s  Wrap with Kerlix  Change daily  Continue Ceftriaxone 1 G IV Q Day  Continue Doxycycline 100 mg po BID x 7 days total  Ankle fracture, right, closed, with routine healing, subsequent encounter  Continue working with PT/OT  Continue exercises as taught by PT/OT  Continue Tramadol 50 mg 1-2 tablets po Q 4 hours prn  Continue Tylenol CR 1300 mg po BID  Elevate foot when at rest  NWB to RLE  Follow up with podiatry and Orthopedists as instructed  Family/ staff Communication:  Total Time:  Documentation:  Face to Face:  Family/Phone:   Labs/tests ordered:  Cbc, met c  Medication list reviewed and assessed for continued appropriateness.  Vikki Ports, NP-C Geriatrics [facility] [phone], [place] [address] Cell Phone (Mon-Fri 8am-5pm):  [phone] On Call:  [phone] & follow prompts after 5pm & weekends Office Phone:  [phone] Office Fax:  [phone]

## 2017-04-06 ENCOUNTER — Ambulatory Visit (INDEPENDENT_AMBULATORY_CARE_PROVIDER_SITE_OTHER): Payer: PPO

## 2017-04-06 ENCOUNTER — Other Ambulatory Visit: Payer: Self-pay

## 2017-04-06 ENCOUNTER — Encounter: Payer: Self-pay | Admitting: Emergency Medicine

## 2017-04-06 ENCOUNTER — Ambulatory Visit (INDEPENDENT_AMBULATORY_CARE_PROVIDER_SITE_OTHER): Payer: PPO | Admitting: Podiatry

## 2017-04-06 ENCOUNTER — Inpatient Hospital Stay
Admission: EM | Admit: 2017-04-06 | Discharge: 2017-04-10 | DRG: 580 | Disposition: A | Discharge status: Skilled Nursing Facility | Payer: PPO | Attending: Internal Medicine | Admitting: Internal Medicine

## 2017-04-06 ENCOUNTER — Ambulatory Visit: Payer: PPO

## 2017-04-06 DIAGNOSIS — M810 Age-related osteoporosis without current pathological fracture: Secondary | ICD-10-CM | POA: Diagnosis present

## 2017-04-06 DIAGNOSIS — Z79899 Other long term (current) drug therapy: Secondary | ICD-10-CM | POA: Diagnosis not present

## 2017-04-06 DIAGNOSIS — Z880 Allergy status to penicillin: Secondary | ICD-10-CM

## 2017-04-06 DIAGNOSIS — Z7982 Long term (current) use of aspirin: Secondary | ICD-10-CM

## 2017-04-06 DIAGNOSIS — L089 Local infection of the skin and subcutaneous tissue, unspecified: Secondary | ICD-10-CM | POA: Diagnosis not present

## 2017-04-06 DIAGNOSIS — S92414D Nondisplaced fracture of proximal phalanx of right great toe, subsequent encounter for fracture with routine healing: Secondary | ICD-10-CM | POA: Diagnosis not present

## 2017-04-06 DIAGNOSIS — R630 Anorexia: Secondary | ICD-10-CM | POA: Diagnosis not present

## 2017-04-06 DIAGNOSIS — Z6825 Body mass index (BMI) 25.0-25.9, adult: Secondary | ICD-10-CM | POA: Diagnosis not present

## 2017-04-06 DIAGNOSIS — L97512 Non-pressure chronic ulcer of other part of right foot with fat layer exposed: Secondary | ICD-10-CM

## 2017-04-06 DIAGNOSIS — F3341 Major depressive disorder, recurrent, in partial remission: Secondary | ICD-10-CM | POA: Diagnosis not present

## 2017-04-06 DIAGNOSIS — Z882 Allergy status to sulfonamides status: Secondary | ICD-10-CM | POA: Diagnosis not present

## 2017-04-06 DIAGNOSIS — M069 Rheumatoid arthritis, unspecified: Secondary | ICD-10-CM | POA: Diagnosis not present

## 2017-04-06 DIAGNOSIS — F0390 Unspecified dementia without behavioral disturbance: Secondary | ICD-10-CM | POA: Diagnosis present

## 2017-04-06 DIAGNOSIS — E785 Hyperlipidemia, unspecified: Secondary | ICD-10-CM | POA: Diagnosis not present

## 2017-04-06 DIAGNOSIS — Z8249 Family history of ischemic heart disease and other diseases of the circulatory system: Secondary | ICD-10-CM | POA: Diagnosis not present

## 2017-04-06 DIAGNOSIS — I10 Essential (primary) hypertension: Secondary | ICD-10-CM | POA: Diagnosis not present

## 2017-04-06 DIAGNOSIS — Z8781 Personal history of (healed) traumatic fracture: Secondary | ICD-10-CM | POA: Diagnosis not present

## 2017-04-06 DIAGNOSIS — M0600 Rheumatoid arthritis without rheumatoid factor, unspecified site: Secondary | ICD-10-CM | POA: Diagnosis not present

## 2017-04-06 DIAGNOSIS — Z885 Allergy status to narcotic agent status: Secondary | ICD-10-CM | POA: Diagnosis not present

## 2017-04-06 DIAGNOSIS — L03115 Cellulitis of right lower limb: Secondary | ICD-10-CM

## 2017-04-06 DIAGNOSIS — I272 Pulmonary hypertension, unspecified: Secondary | ICD-10-CM | POA: Diagnosis not present

## 2017-04-06 DIAGNOSIS — L02619 Cutaneous abscess of unspecified foot: Secondary | ICD-10-CM | POA: Diagnosis present

## 2017-04-06 DIAGNOSIS — I129 Hypertensive chronic kidney disease with stage 1 through stage 4 chronic kidney disease, or unspecified chronic kidney disease: Secondary | ICD-10-CM | POA: Diagnosis not present

## 2017-04-06 DIAGNOSIS — F0391 Unspecified dementia with behavioral disturbance: Secondary | ICD-10-CM | POA: Diagnosis not present

## 2017-04-06 DIAGNOSIS — K219 Gastro-esophageal reflux disease without esophagitis: Secondary | ICD-10-CM | POA: Diagnosis present

## 2017-04-06 DIAGNOSIS — N179 Acute kidney failure, unspecified: Secondary | ICD-10-CM

## 2017-04-06 DIAGNOSIS — M6281 Muscle weakness (generalized): Secondary | ICD-10-CM | POA: Diagnosis not present

## 2017-04-06 DIAGNOSIS — Z9181 History of falling: Secondary | ICD-10-CM | POA: Diagnosis not present

## 2017-04-06 DIAGNOSIS — M48062 Spinal stenosis, lumbar region with neurogenic claudication: Secondary | ICD-10-CM | POA: Diagnosis not present

## 2017-04-06 DIAGNOSIS — Z452 Encounter for adjustment and management of vascular access device: Secondary | ICD-10-CM | POA: Diagnosis not present

## 2017-04-06 DIAGNOSIS — Z888 Allergy status to other drugs, medicaments and biological substances status: Secondary | ICD-10-CM | POA: Diagnosis not present

## 2017-04-06 DIAGNOSIS — Z853 Personal history of malignant neoplasm of breast: Secondary | ICD-10-CM

## 2017-04-06 DIAGNOSIS — Z95828 Presence of other vascular implants and grafts: Secondary | ICD-10-CM

## 2017-04-06 DIAGNOSIS — R262 Difficulty in walking, not elsewhere classified: Secondary | ICD-10-CM | POA: Diagnosis not present

## 2017-04-06 DIAGNOSIS — S92411D Displaced fracture of proximal phalanx of right great toe, subsequent encounter for fracture with routine healing: Secondary | ICD-10-CM | POA: Diagnosis not present

## 2017-04-06 DIAGNOSIS — R52 Pain, unspecified: Secondary | ICD-10-CM

## 2017-04-06 DIAGNOSIS — L02611 Cutaneous abscess of right foot: Secondary | ICD-10-CM | POA: Diagnosis present

## 2017-04-06 DIAGNOSIS — E119 Type 2 diabetes mellitus without complications: Secondary | ICD-10-CM | POA: Diagnosis not present

## 2017-04-06 LAB — CBC WITH DIFFERENTIAL/PLATELET
BASOS ABS: 0.2 10*3/uL — AB (ref 0–0.1)
Basophils Relative: 1 %
Eosinophils Absolute: 0.2 10*3/uL (ref 0–0.7)
Eosinophils Relative: 1 %
HEMATOCRIT: 34.9 % — AB (ref 35.0–47.0)
HEMOGLOBIN: 11.6 g/dL — AB (ref 12.0–16.0)
LYMPHS PCT: 5 %
Lymphs Abs: 0.8 10*3/uL — ABNORMAL LOW (ref 1.0–3.6)
MCH: 39 pg — ABNORMAL HIGH (ref 26.0–34.0)
MCHC: 33.2 g/dL (ref 32.0–36.0)
MCV: 117.6 fL — ABNORMAL HIGH (ref 80.0–100.0)
Monocytes Absolute: 2.5 10*3/uL — ABNORMAL HIGH (ref 0.2–0.9)
Monocytes Relative: 15 %
NEUTROS ABS: 12.7 10*3/uL — AB (ref 1.4–6.5)
Neutrophils Relative %: 78 %
Platelets: 345 10*3/uL (ref 150–440)
RBC: 2.97 MIL/uL — ABNORMAL LOW (ref 3.80–5.20)
RDW: 16.4 % — ABNORMAL HIGH (ref 11.5–14.5)
WBC: 16.4 10*3/uL — ABNORMAL HIGH (ref 3.6–11.0)

## 2017-04-06 LAB — COMPREHENSIVE METABOLIC PANEL
ALBUMIN: 3.3 g/dL — AB (ref 3.5–5.0)
ALT: 14 U/L (ref 14–54)
ANION GAP: 10 (ref 5–15)
AST: 20 U/L (ref 15–41)
Alkaline Phosphatase: 66 U/L (ref 38–126)
BILIRUBIN TOTAL: 0.8 mg/dL (ref 0.3–1.2)
BUN: 34 mg/dL — ABNORMAL HIGH (ref 6–20)
CO2: 23 mmol/L (ref 22–32)
Calcium: 9.2 mg/dL (ref 8.9–10.3)
Chloride: 108 mmol/L (ref 101–111)
Creatinine, Ser: 1.19 mg/dL — ABNORMAL HIGH (ref 0.44–1.00)
GFR calc Af Amer: 47 mL/min — ABNORMAL LOW (ref >60)
GFR calc non Af Amer: 41 mL/min — ABNORMAL LOW (ref >60)
GLUCOSE: 96 mg/dL (ref 65–99)
Potassium: 4.7 mmol/L (ref 3.5–5.1)
SODIUM: 141 mmol/L (ref 135–145)
TOTAL PROTEIN: 6.9 g/dL (ref 6.5–8.1)

## 2017-04-06 LAB — AEROBIC/ANAEROBIC CULTURE W GRAM STAIN (SURGICAL/DEEP WOUND)
Culture: NO GROWTH
Special Requests: NORMAL

## 2017-04-06 LAB — LACTIC ACID, PLASMA
LACTIC ACID, VENOUS: 0.9 mmol/L (ref 0.5–1.9)
Lactic Acid, Venous: 0.8 mmol/L (ref 0.5–1.9)

## 2017-04-06 MED ORDER — ACETAMINOPHEN 650 MG RE SUPP: 650.0000 mg | Freq: Four times a day (QID) | RECTAL | Status: DC | PRN

## 2017-04-06 MED ORDER — VANCOMYCIN HCL IN DEXTROSE 1-5 GM/200ML-% IV SOLN
1.0000 g | Freq: Once | INTRAVENOUS | Status: AC
  Administered 2017-04-06: 1 g via INTRAVENOUS
  Filled 2017-04-06: qty 200

## 2017-04-06 MED ORDER — AMLODIPINE BESYLATE 5 MG PO TABS
10.0000 mg | ORAL_TABLET | Freq: Every day | ORAL | Status: DC
  Administered 2017-04-07 – 2017-04-10 (×4): 10 mg via ORAL
  Filled 2017-04-06 (×4): qty 2

## 2017-04-06 MED ORDER — SODIUM CHLORIDE 0.9 % IV BOLUS (SEPSIS)
1000.0000 mL | Freq: Once | INTRAVENOUS | Status: AC
  Administered 2017-04-06: 1000 mL via INTRAVENOUS

## 2017-04-06 MED ORDER — FOLIC ACID 1 MG PO TABS
1.0000 mg | ORAL_TABLET | Freq: Every day | ORAL | Status: DC
  Administered 2017-04-07 – 2017-04-10 (×3): 1 mg via ORAL
  Filled 2017-04-06 (×3): qty 1

## 2017-04-06 MED ORDER — METOPROLOL TARTRATE 50 MG PO TABS
100.0000 mg | ORAL_TABLET | Freq: Two times a day (BID) | ORAL | Status: DC
  Administered 2017-04-06 – 2017-04-10 (×7): 100 mg via ORAL
  Filled 2017-04-06 (×8): qty 2

## 2017-04-06 MED ORDER — ONDANSETRON HCL 4 MG/2ML IJ SOLN: 4.0000 mg | Freq: Four times a day (QID) | INTRAMUSCULAR | Status: DC | PRN

## 2017-04-06 MED ORDER — CEFTRIAXONE SODIUM 1 G IJ SOLR
1.0000 g | Freq: Once | INTRAMUSCULAR | Status: AC
  Administered 2017-04-06: 13:00:00 via INTRAVENOUS
  Filled 2017-04-06: qty 10

## 2017-04-06 MED ORDER — ADULT MULTIVITAMIN W/MINERALS CH
1.0000 | ORAL_TABLET | Freq: Every day | ORAL | Status: DC
  Administered 2017-04-07 – 2017-04-10 (×3): 1 via ORAL
  Filled 2017-04-06 (×5): qty 1

## 2017-04-06 MED ORDER — VANCOMYCIN HCL IN DEXTROSE 750-5 MG/150ML-% IV SOLN
750.0000 mg | INTRAVENOUS | Status: DC
  Administered 2017-04-07 – 2017-04-09 (×3): 750 mg via INTRAVENOUS
  Filled 2017-04-06 (×3): qty 150

## 2017-04-06 MED ORDER — BISACODYL 10 MG RE SUPP: 10.0000 mg | Freq: Every day | RECTAL | Status: DC | PRN

## 2017-04-06 MED ORDER — HEPARIN SODIUM (PORCINE) 5000 UNIT/ML IJ SOLN
5000.0000 [IU] | Freq: Three times a day (TID) | INTRAMUSCULAR | Status: DC
  Administered 2017-04-06 – 2017-04-10 (×10): 5000 [IU] via SUBCUTANEOUS
  Filled 2017-04-06 (×11): qty 1

## 2017-04-06 MED ORDER — MIRTAZAPINE 15 MG PO TABS
7.5000 mg | ORAL_TABLET | Freq: Every day | ORAL | Status: DC
  Administered 2017-04-06 – 2017-04-09 (×4): 7.5 mg via ORAL
  Filled 2017-04-06 (×4): qty 1

## 2017-04-06 MED ORDER — ACETAMINOPHEN 325 MG PO TABS
650.0000 mg | ORAL_TABLET | Freq: Four times a day (QID) | ORAL | Status: DC | PRN
  Administered 2017-04-07 – 2017-04-08 (×3): 650 mg via ORAL
  Filled 2017-04-06 (×3): qty 2

## 2017-04-06 MED ORDER — OXYCODONE HCL 5 MG PO TABS: 5.0000 mg | ORAL_TABLET | ORAL | Status: DC | PRN

## 2017-04-06 MED ORDER — ISOSORBIDE MONONITRATE ER 30 MG PO TB24
30.0000 mg | ORAL_TABLET | Freq: Every day | ORAL | Status: DC
  Administered 2017-04-07 – 2017-04-10 (×4): 30 mg via ORAL
  Filled 2017-04-06 (×4): qty 1

## 2017-04-06 MED ORDER — ONDANSETRON HCL 4 MG PO TABS: 4.0000 mg | ORAL_TABLET | Freq: Four times a day (QID) | ORAL | Status: DC | PRN

## 2017-04-06 MED ORDER — POLYETHYLENE GLYCOL 3350 17 G PO PACK
17.0000 g | PACK | Freq: Every day | ORAL | Status: DC | PRN
Start: 2017-04-06 — End: 2017-04-10

## 2017-04-06 MED ORDER — DEXTROSE 5 % IV SOLN
1.0000 g | INTRAVENOUS | Status: DC
  Administered 2017-04-07: 1 g via INTRAVENOUS
  Filled 2017-04-06 (×3): qty 10

## 2017-04-06 MED ORDER — LOSARTAN POTASSIUM 50 MG PO TABS
100.0000 mg | ORAL_TABLET | Freq: Every day | ORAL | Status: DC
  Administered 2017-04-07 – 2017-04-10 (×4): 100 mg via ORAL
  Filled 2017-04-06 (×4): qty 2

## 2017-04-06 MED ORDER — TRAMADOL HCL 50 MG PO TABS
50.0000 mg | ORAL_TABLET | Freq: Four times a day (QID) | ORAL | Status: DC | PRN
  Administered 2017-04-07 – 2017-04-10 (×2): 50 mg via ORAL
  Filled 2017-04-06 (×2): qty 1

## 2017-04-06 MED ORDER — ASPIRIN EC 81 MG PO TBEC
81.0000 mg | DELAYED_RELEASE_TABLET | Freq: Every day | ORAL | Status: DC
Start: 2017-04-07 — End: 2017-04-10
  Administered 2017-04-07 – 2017-04-10 (×3): 81 mg via ORAL
  Filled 2017-04-06 (×3): qty 1

## 2017-04-06 MED ORDER — SERTRALINE HCL 50 MG PO TABS
100.0000 mg | ORAL_TABLET | Freq: Every day | ORAL | Status: DC
Start: 2017-04-07 — End: 2017-04-10
  Administered 2017-04-07 – 2017-04-10 (×4): 100 mg via ORAL
  Filled 2017-04-06 (×4): qty 2

## 2017-04-06 MED ORDER — MELOXICAM 7.5 MG PO TABS
7.5000 mg | ORAL_TABLET | Freq: Every day | ORAL | Status: DC
  Administered 2017-04-07 – 2017-04-10 (×3): 7.5 mg via ORAL
  Filled 2017-04-06 (×4): qty 1

## 2017-04-06 MED ORDER — VITAMIN C 500 MG PO TABS
1000.0000 mg | ORAL_TABLET | Freq: Every day | ORAL | Status: DC
  Administered 2017-04-07 – 2017-04-10 (×3): 1000 mg via ORAL
  Filled 2017-04-06 (×4): qty 2

## 2017-04-06 MED ORDER — DOCUSATE SODIUM 100 MG PO CAPS
100.0000 mg | ORAL_CAPSULE | Freq: Two times a day (BID) | ORAL | Status: DC
  Administered 2017-04-06 – 2017-04-10 (×7): 100 mg via ORAL
  Filled 2017-04-06 (×7): qty 1

## 2017-04-06 NOTE — ED Notes (Signed)
Report given to jennifer by Rubin Payor

## 2017-04-06 NOTE — NC FL2 (Signed)
[place] LEVEL OF CARE SCREENING TOOL     IDENTIFICATION  Patient Name: [name] [name] Birthdate: Jan 06, 1931 Sex: female Admission Date (Current Location): 04/06/2017  [place] and [place] Number:  Engineering geologist and Address:  [facility], [address] [place], [place] [address]      Provider Number: [id number]  Attending Physician Name and Address:  Gorden Harms, MD  Relative Name and Phone Number:       Current Level of Care: Hospital Recommended Level of Care: [place] Prior Approval Number:    Date Approved/Denied:   PASRR Number: [id number] )  Discharge Plan: SNF    Current Diagnoses: Patient Active Problem List   Diagnosis Date Noted  . Foot abscess 04/06/2017  . Ankle fracture, right, closed, with routine healing, subsequent encounter 03/26/2017  . Cellulitis of right foot 03/26/2017  . Osteoarthritis 07/03/2016  . Rheumatoid arthritis without elevated rheumatoid factor ([place]) 07/03/2016  . Pulmonary HTN ([place]) 06/19/2016  . Chest pain 11/09/2015  . GERD (gastroesophageal reflux disease) 10/06/2013  . HTN (hypertension) 10/06/2013  . Hyperlipidemia, unspecified 10/06/2013  . Osteoporosis 10/06/2013  . Lumbar radiculitis 07/08/2013  . Lumbar spondylosis 07/08/2013  . Lumbar stenosis with neurogenic claudication 07/08/2013    Orientation RESPIRATION BLADDER Height & Weight     Self, Time  Normal Continent Weight: 145 lb (65.8 kg) Height:  5\' 3"  (160 cm)  BEHAVIORAL SYMPTOMS/MOOD NEUROLOGICAL BOWEL NUTRITION STATUS      Continent Diet(Diet: Heart Healthy )  AMBULATORY STATUS COMMUNICATION OF NEEDS Skin   Extensive Assist Verbally Other (Comment)(right foot infection)                       Personal Care Assistance Level of Assistance  Bathing, Feeding, Dressing Bathing Assistance: Limited assistance Feeding assistance: Independent Dressing Assistance: Limited assistance      Functional Limitations Info  Sight, Hearing, Speech Sight Info: Adequate Hearing Info: Adequate Speech Info: Adequate    SPECIAL CARE FACTORS FREQUENCY  PT (By licensed PT), OT (By licensed OT)     PT Frequency: (5) OT Frequency: (5)            Contractures      Additional Factors Info  Code Status, Allergies Code Status Info: (Full Code. ) Allergies Info: (Atorvastatin, Bisphosphonates, Codeine, Memantine, Omeprazole, Other, Salsalate, Sulfa Antibiotics, Penicillins)           Current Medications (04/06/2017):  This is the current hospital active medication list Current Facility-Administered Medications  Medication Dose Route Frequency Provider Last Rate Last Dose  . acetaminophen (TYLENOL) tablet 650 mg  650 mg Oral Q6H PRN Salary, Montell D, MD       Or  . acetaminophen (TYLENOL) suppository 650 mg  650 mg Rectal Q6H PRN Salary, Montell D, MD      . amLODipine (NORVASC) tablet 10 mg  10 mg Oral Daily Salary, Montell D, MD      . Derrill Memo ON 04/07/2017] aspirin EC tablet 81 mg  81 mg Oral Daily Salary, Montell D, MD      . bisacodyl (DULCOLAX) suppository 10 mg  10 mg Rectal Daily PRN Salary, Montell D, MD      . Derrill Memo ON 04/07/2017] cefTRIAXone (ROCEPHIN) 1 g in dextrose 5 % 50 mL IVPB  1 g Intravenous Q24H Salary, Montell D, MD      . docusate sodium (COLACE) capsule 100 mg  100 mg Oral BID Salary, Avel Peace, MD      . [  START ON 0/04/5007] folic acid (FOLVITE) tablet 1 mg  1 mg Oral Daily Salary, Montell D, MD      . heparin injection 5,000 Units  5,000 Units Subcutaneous Q8H Salary, Montell D, MD      . Derrill Memo ON 04/07/2017] isosorbide mononitrate (IMDUR) 24 hr tablet 30 mg  30 mg Oral Daily Salary, Montell D, MD      . Derrill Memo ON 04/07/2017] losartan (COZAAR) tablet 100 mg  100 mg Oral Daily Salary, Montell D, MD      . Derrill Memo ON 04/07/2017] meloxicam (MOBIC) tablet 7.5 mg  7.5 mg Oral Daily Salary, Montell D, MD      . metoprolol tartrate (LOPRESSOR) tablet 100 mg  100 mg Oral  BID Salary, Montell D, MD      . mirtazapine (REMERON) tablet 7.5 mg  7.5 mg Oral QHS Salary, Montell D, MD      . multivitamin with minerals tablet 1 tablet  1 tablet Oral Daily Salary, Montell D, MD      . ondansetron (ZOFRAN) tablet 4 mg  4 mg Oral Q6H PRN Salary, Montell D, MD       Or  . ondansetron (ZOFRAN) injection 4 mg  4 mg Intravenous Q6H PRN Salary, Montell D, MD      . oxyCODONE (Oxy IR/ROXICODONE) immediate release tablet 5 mg  5 mg Oral Q4H PRN Salary, Montell D, MD      . polyethylene glycol (MIRALAX / GLYCOLAX) packet 17 g  17 g Oral Daily PRN Salary, Montell D, MD      . Derrill Memo ON 04/07/2017] sertraline (ZOLOFT) tablet 100 mg  100 mg Oral Daily Salary, Montell D, MD      . traMADol (ULTRAM) tablet 50 mg  50 mg Oral Q6H PRN Salary, Montell D, MD      . Derrill Memo ON 04/07/2017] vitamin C (ASCORBIC ACID) tablet 1,000 mg  1,000 mg Oral Daily Salary, Montell D, MD         Discharge Medications: Please see discharge summary for a list of discharge medications.  Relevant Imaging Results:  Relevant Lab Results:   Additional Information (SSN: [id number])  Tiffnay Bossi, Veronia Beets, LCSW

## 2017-04-06 NOTE — ED Provider Notes (Signed)
[facility] Emergency Department Provider Note ____________________________________________   I have reviewed the triage vital signs and the triage nursing note.  HISTORY  Chief Complaint Cellulitis   Historian Patient and niece  HPI [name] [name] is a 84 y.o. female presents from podiatry office after being sent to the ED for further investigation of worsening right foot wound.  Patient sustained multiple fractures to her right foot several weeks ago and then about a week or so after that developed significant redness which was going up her leg and was treated in the hospital for nearly a week out of concern for cellulitis.  Apparently during the hospitalization she received vancomycin and Rocephin and was converted to doxycycline p.o. and went home just several days ago.  She saw podiatry today and out of concern for skin breakdown and redness and swelling, concern for worsening cellulitis she was sent to the ED for further evaluation.  Otherwise no altered mental status, fever, or really worsening pain.   Past Medical History:  Diagnosis Date  . breast cancer   . Fibrocystic breast disease    Severe; status post bilateral mastectomies and implants  . GERD (gastroesophageal reflux disease)    Severe; manifested vocal cord irritation, status-post Nissen fundcoplication in 07/7339  . History of ataxia    with questionable right upper and lower  . History of breast cancer   . History of exertional chest pain    with her last stress echo in 09/1999 being normal  . History of migraine    vascular  . History of shingles   . History of syncope 12/1999   With negative cardiac workup to include an echocardiogram, which revealed only mild to moderate MR, a CT scan of the chest, which was negative, and a Cardiolite stress test, which was normal  . Hyperlipidemia, unspecified   . Hypertension    CTA 05/2004 showed no renovascular cause  . Mild dementia   .  Muscle atrophy    Positive FANA and anti- SCL70 antibodies; S/p biopsy without eosinic fascitis; Remicade discontinued  . Osteoarthritis    a. Cervical spine. b. Lumbar spine/spinal stenosis. [facility])  . Osteoporosis    a. Fosamax b. Right wrist fracture. c. Sternal fracture ([place])  . Rheumatoid arthritis without elevated rheumatoid factor ([facility])    a. Methotrexate. b. S/p Remicade. c. Seronegative. d. S/p Plaquenil. (PFX)    Patient Active Problem List   Diagnosis Date Noted  . Ankle fracture, right, closed, with routine healing, subsequent encounter 03/26/2017  . Cellulitis of right foot 03/26/2017  . Osteoarthritis 07/03/2016  . Rheumatoid arthritis without elevated rheumatoid factor ([place]) 07/03/2016  . Pulmonary HTN ([place]) 06/19/2016  . Chest pain 11/09/2015  . GERD (gastroesophageal reflux disease) 10/06/2013  . HTN (hypertension) 10/06/2013  . Hyperlipidemia, unspecified 10/06/2013  . Osteoporosis 10/06/2013  . Lumbar radiculitis 07/08/2013  . Lumbar spondylosis 07/08/2013  . Lumbar stenosis with neurogenic claudication 07/08/2013    Past Surgical History:  Procedure Laterality Date  . APPENDECTOMY    . CHOLECYSTECTOMY    . LAPAROSCOPIC NISSEN FUNDOPLICATION  90/2409  . LUMBAR LAMINECTOMY     for spinal stenosis  . MASTECTOMY Bilateral    with implants  . ORIF DISTAL RADIUS FRACTURE Right 07/27/2008   with volar plate  . TOTAL ABDOMINAL HYSTERECTOMY W/ BILATERAL SALPINGOOPHORECTOMY    . VASCULAR SURGERY      Prior to Admission medications   Medication Sig Start Date End Date Taking? Authorizing Provider  acetaminophen (  TYLENOL) 650 MG CR tablet Take 1,300 mg by mouth 2 (two) times daily.    Yes [provider]  amLODipine (NORVASC) 5 MG tablet Take 10 mg by mouth daily. 2 tabs   Yes [provider]  Ascorbic Acid (VITAMIN C) 1000 MG tablet Take 1,000 mg by mouth daily.   Yes [provider]  aspirin EC 81 MG tablet Take 81 mg by mouth daily.    Yes [provider]  doxycycline (VIBRAMYCIN) 100 MG capsule Take 1 capsule by mouth 2 (two) times daily. 04/03/17  Yes [provider]  folic acid (FOLVITE) 1 MG tablet Take 1 mg by mouth daily.   Yes [provider]  isosorbide mononitrate (IMDUR) 30 MG 24 hr tablet Take 30 mg by mouth daily.    Yes [provider]  losartan (COZAAR) 100 MG tablet Take 100 mg by mouth daily.    Yes [provider]  meloxicam (MOBIC) 7.5 MG tablet Take 1 tablet (7.5 mg total) by mouth daily. 04/04/17  Yes Salary, Avel Peace, MD  methotrexate (RHEUMATREX) 2.5 MG tablet Take 6 tablets by mouth every Friday.  10/28/15  Yes [provider]  metoprolol (LOPRESSOR) 100 MG tablet Take 100 mg by mouth 2 (two) times daily.    Yes [provider]  mirtazapine (REMERON) 7.5 MG tablet Take 7.5 mg by mouth at bedtime.   Yes [provider]  Multiple Vitamin (MULTI-VITAMINS) TABS Take 1 tablet by mouth daily.   Yes [provider]  oxyCODONE (OXY IR/ROXICODONE) 5 MG immediate release tablet Take 1 tablet (5 mg total) by mouth every 4 (four) hours as needed for severe pain. 04/04/17  Yes Toni Arthurs, NP  sertraline (ZOLOFT) 100 MG tablet Take 100 mg by mouth daily.   Yes [provider]  traMADol (ULTRAM) 50 MG tablet Take 1 tablet (50 mg total) by mouth every 6 (six) hours as needed for moderate pain. 04/04/17   Toni Arthurs, NP    Allergies  Allergen Reactions  . Atorvastatin Other (See Comments)    Myositis  . Bisphosphonates Other (See Comments)    GI upset  . Codeine Other (See Comments)  . Memantine Other (See Comments)  . Omeprazole Nausea Only  . Other Other (See Comments)    Leg cramps GI upset  . Salsalate Other (See Comments)  . Sulfa Antibiotics     Pt doesn't remember  . Penicillins Itching    Has patient had a PCN reaction causing immediate rash, facial/tongue/throat swelling, SOB or lightheadedness with hypotension:  Yes Has patient had a PCN reaction causing severe rash involving mucus membranes or skin necrosis: No Has patient had a PCN reaction that required hospitalization: No Has patient had a PCN reaction occurring within the last 10 years: No If all of the above answers are "NO", then may proceed with Cephalosporin use.    Family History  Problem Relation Age of Onset  . Heart failure Mother   . Hypertension Mother   . Cancer Father   . Heart attack Sister     Social History Social History   Tobacco Use  . Smoking status: Never Smoker  . Smokeless tobacco: Never Used  Substance Use Topics  . Alcohol use: No  . Drug use: No    Review of Systems  Constitutional: Negative for fever. Eyes: Negative for visual changes. ENT: Negative for sore throat. Cardiovascular: Negative for chest pain. Respiratory: Negative for shortness of breath. Gastrointestinal: Negative for abdominal pain,  vomiting and diarrhea. Genitourinary: Negative for dysuria. Musculoskeletal: Negative for back pain. Skin: Redness and skin breakdown to the right foot Neurological: Negative for headache.  ____________________________________________   PHYSICAL EXAM:  VITAL SIGNS: ED Triage Vitals  Enc Vitals Group     BP 04/06/17 0929 (!) 143/47     Pulse Rate 04/06/17 0929 65     Resp 04/06/17 0929 18     Temp 04/06/17 0929 98.5 F (36.9 C)     Temp Source 04/06/17 0929 Oral     SpO2 04/06/17 0929 96 %     Weight 04/06/17 0924 147 lb (66.7 kg)     Height 04/06/17 0929 5\' 3"  (1.6 m)     Head Circumference --      Peak Flow --      Pain Score --      Pain Loc --      Pain Edu? --      Excl. in [place]? --      Constitutional: Alert and operative. Well appearing and in no distress. HEENT   Head: Normocephalic and atraumatic.      Eyes: Conjunctivae are normal. Pupils equal and round.       Ears:         Nose: No congestion/rhinnorhea.   Mouth/Throat: Mucous membranes are moist.   Neck: No  stridor. Cardiovascular/Chest: Normal rate, regular rhythm.  No murmurs, rubs, or gallops. Respiratory: Normal respiratory effort without tachypnea nor retractions. Breath sounds are clear and equal bilaterally. No wheezes/rales/rhonchi. Gastrointestinal: Soft. No distention, no guarding, no rebound. Nontender.    Genitourinary/rectal:Deferred Musculoskeletal: Significant right foot edema with redness and large area of skin breakdown across the dorsum of the foot.  No swelling beyond the ankle.  No calf tenderness. Neurologic:  Normal speech and language. No gross or focal neurologic deficits are appreciated. Skin: Erythema and warmth and swelling to the right foot. Psychiatric: Mood and affect are normal. Speech and behavior are normal. Patient exhibits appropriate insight and judgment.   ____________________________________________  LABS (pertinent positives/negatives) I, Lisa Roca, MD the attending physician have reviewed the labs noted below.  Labs Reviewed  COMPREHENSIVE METABOLIC PANEL - Abnormal; Notable for the following components:      Result Value   BUN 34 (*)    Creatinine, Ser 1.19 (*)    Albumin 3.3 (*)    GFR calc non Af Amer 41 (*)    GFR calc Af Amer 47 (*)    All other components within normal limits  CBC WITH DIFFERENTIAL/PLATELET - Abnormal; Notable for the following components:   WBC 16.4 (*)    RBC 2.97 (*)    Hemoglobin 11.6 (*)    HCT 34.9 (*)    MCV 117.6 (*)    MCH 39.0 (*)    RDW 16.4 (*)    Neutro Abs 12.7 (*)    Lymphs Abs 0.8 (*)    Monocytes Absolute 2.5 (*)    Basophils Absolute 0.2 (*)    All other components within normal limits  CULTURE, BLOOD (ROUTINE X 2)  CULTURE, BLOOD (ROUTINE X 2)  AEROBIC CULTURE (SUPERFICIAL SPECIMEN)  LACTIC ACID, PLASMA  LACTIC ACID, PLASMA     ____________________________________________  RADIOLOGY All Xrays were viewed by me.  Imaging interpreted by Radiologist, and I, Lisa Roca, MD the attending  physician have reviewed the radiologist interpretation noted below.  None __________________________________________  PROCEDURES  Procedure(s) performed: None  Critical Care performed: None   ____________________________________________  ED COURSE / ASSESSMENT  AND PLAN  Pertinent labs & imaging results that were available during my care of the patient were reviewed by me and considered in my medical decision making (see chart for details).    Patient's foot does look significantly swollen and red and with large area of skin necrosis across the top of the foot, highly concerning for worsening cellulitis.  She does not appear to be septic does not have a fever, tachycardia, hypotension, or elevated lactate.  I initially spoke with the hospitalist who was concerned that this does not sound like the patient necessarily has to be in the hospital, and was most interested in infectious disease evaluation since the patient was recently evaluated for that.  I spoke with Dr. Ola Spurr who will come over and about 1 hour he did recommend Rocephin and vancomycin dose now.  Patient has listed allergy to penicillin, but tolerated ceftriaxone in the hospital.  Disposition will be based off of infectious disease consultation, as patient might be okay for PICC line and outpatient antibiotic by IV.  Patient seen by Dr. Ola Spurr here in the emergency department, feels left foot is definitely much worse, and is recommending admission with IV antibiotics and podiatry consultation and hospitalist admission.  I spoke with hospitalist for admission.  DIFFERENTIAL DIAGNOSIS: Including but not limited to sepsis, cellulitis, necrosis, etc.  CONSULTATIONS:   Hospitalist for admission, requested ED infectious disease consultation given Dr. Ola Spurr gnosis patient well.  I spoke with Dr. Ola Spurr will come out in about 1 hour after clinic to assess the patient, consider possible patient might be able  to go home with PICC line.  Spoke with Dr. Jerelyn Charles, hospitalist for admission.   Patient / Family / Caregiver informed of clinical course, medical decision-making process, and agree with plan.   ___________________________________________   FINAL CLINICAL IMPRESSION(S) / ED DIAGNOSES   Final diagnoses:  Acute renal failure, unspecified acute renal failure type ([place])  Cellulitis of foot without toes, right      ___________________________________________        Note: This dictation was prepared with Dragon dictation. Any transcriptional errors that result from this process are unintentional    Lisa Roca, MD 04/06/17 1414
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
## 2017-04-06 NOTE — Consult Note (Signed)
[facility] Infectious Disease     Reason for Consult: R foot cellulitis  Referring Physician: Ashok Norris, Date of Admission:  04/06/2017   Active Problems:   * No active hospital problems. *   HPI: [name] [name] is a 84 y.o. female admitted with worsening R foot pain, drainage and swelling.  She was recently admitted from 1/28-2/5 with redness and was treated with IV abx and seen by myself and podiatry. Her MRI done 2/2 did not show OM or abscess but multiple fractures.  She initially injured her foot Jan 20 stepping off a bus and falling . Seen in ED at that time and had xray with fx of 1st prox phalannx and 2nd MT.  Wound cx this past admission was neg.  She was dced on 2/5 on oral doxy.  However the foot has continued to be red and is now draining.  No fever now, wbc 16. Xray done.   Past Medical History:  Diagnosis Date  . breast cancer   . Fibrocystic breast disease    Severe; status post bilateral mastectomies and implants  . GERD (gastroesophageal reflux disease)    Severe; manifested vocal cord irritation, status-post Nissen fundcoplication in 03/3760  . History of ataxia    with questionable right upper and lower  . History of breast cancer   . History of exertional chest pain    with her last stress echo in 09/1999 being normal  . History of migraine    vascular  . History of shingles   . History of syncope 12/1999   With negative cardiac workup to include an echocardiogram, which revealed only mild to moderate MR, a CT scan of the chest, which was negative, and a Cardiolite stress test, which was normal  . Hyperlipidemia, unspecified   . Hypertension    CTA 05/2004 showed no renovascular cause  . Mild dementia   . Muscle atrophy    Positive FANA and anti- SCL70 antibodies; S/p biopsy without eosinic fascitis; Remicade discontinued  . Osteoarthritis    a. Cervical spine. b. Lumbar spine/spinal stenosis. [facility])  . Osteoporosis    a. Fosamax b. Right wrist fracture.  c. Sternal fracture ([place])  . Rheumatoid arthritis without elevated rheumatoid factor (HCC)    a. Methotrexate. b. S/p Remicade. c. Seronegative. d. S/p Plaquenil. [facility])   Past Surgical History:  Procedure Laterality Date  . APPENDECTOMY    . CHOLECYSTECTOMY    . LAPAROSCOPIC NISSEN FUNDOPLICATION  83/1517  . LUMBAR LAMINECTOMY     for spinal stenosis  . MASTECTOMY Bilateral    with implants  . ORIF DISTAL RADIUS FRACTURE Right 07/27/2008   with volar plate  . TOTAL ABDOMINAL HYSTERECTOMY W/ BILATERAL SALPINGOOPHORECTOMY    . VASCULAR SURGERY     Social History   Tobacco Use  . Smoking status: Never Smoker  . Smokeless tobacco: Never Used  Substance Use Topics  . Alcohol use: No  . Drug use: No   Family History  Problem Relation Age of Onset  . Heart failure Mother   . Hypertension Mother   . Cancer Father   . Heart attack Sister     Allergies:  Allergies  Allergen Reactions  . Atorvastatin Other (See Comments)    Myositis  . Bisphosphonates Other (See Comments)    GI upset  . Codeine Other (See Comments)  . Memantine Other (See Comments)  . Omeprazole Nausea Only  . Other Other (See Comments)    Leg cramps GI upset  .  Salsalate Other (See Comments)  . Sulfa Antibiotics     Pt doesn't remember  . Penicillins Itching    Has patient had a PCN reaction causing immediate rash, facial/tongue/throat swelling, SOB or lightheadedness with hypotension: Yes Has patient had a PCN reaction causing severe rash involving mucus membranes or skin necrosis: No Has patient had a PCN reaction that required hospitalization: No Has patient had a PCN reaction occurring within the last 10 years: No If all of the above answers are "NO", then may proceed with Cephalosporin use.    Current antibiotics: Antibiotics Given (last 72 hours)    Date/Time Action Medication Dose Rate   04/06/17 1304 New Bag/Given   vancomycin (VANCOCIN) IVPB 1000 mg/200 mL premix 1 g    04/06/17 1304 New  Bag/Given   cefTRIAXone (ROCEPHIN) 1 g in dextrose 5 % 50 mL IVPB  100 mL/hr      MEDICATIONS:   Review of Systems - 11 systems reviewed and negative per HPI   OBJECTIVE: Temp:  [98.5 F (36.9 C)-98.9 F (37.2 C)] 98.5 F (36.9 C) (02/08 0929) Pulse Rate:  [65-93] 65 (02/08 0929) Resp:  [18] 18 (02/08 0929) BP: (143-147)/(47) 143/47 (02/08 0929) SpO2:  [96 %] 96 % (02/08 0929) Weight:  [65.8 kg (145 lb)-66.7 kg (147 lb)] 65.8 kg (145 lb) (02/08 0929) Physical Exam  Constitutional:  oriented to person, place, and time. Thin, frail. HENT: [place]/AT, PERRLA, no scleral icterus Mouth/Throat: Oropharynx is clear and moist. No oropharyngeal exudate.  Cardiovascular: Normal rate, regular rhythm and normal heart sounds.  Pulmonary/Chest: Effort normal and breath sounds normal. No respiratory distress.  has no wheezes.  Neck = supple, no nuchal rigidity Abdominal: Soft. Bowel sounds are normal.  exhibits no distension. There is no tenderness.  Lymphadenopathy: no cervical adenopathy. No axillary adenopathy Neurological: alert and oriented to person, place, and time.  Ext R foot with 2+ edema Skin: R foot with Large open draining ulceration  dorsal aspect of the right foot.   approximately 5.0 x 5.0 cm.   heavy amount of serosanguineous drainage noted. Some necrotic tissue.  Periwound is very erythematous and edematous. Psychiatric: a normal moond affect.  behavior is normal.    LABS: Results for orders placed or performed during the hospital encounter of 04/06/17 (from the past 48 hour(s))  Comprehensive metabolic panel     Status: Abnormal   Collection Time: 04/06/17  9:29 AM  Result Value Ref Range   Sodium 141 135 - 145 mmol/L   Potassium 4.7 3.5 - 5.1 mmol/L   Chloride 108 101 - 111 mmol/L   CO2 23 22 - 32 mmol/L   Glucose, Bld 96 65 - 99 mg/dL   BUN 34 (H) 6 - 20 mg/dL   Creatinine, Ser 1.19 (H) 0.44 - 1.00 mg/dL   Calcium 9.2 8.9 - 10.3 mg/dL   Total Protein 6.9 6.5 - 8.1  g/dL   Albumin 3.3 (L) 3.5 - 5.0 g/dL   AST 20 15 - 41 U/L   ALT 14 14 - 54 U/L   Alkaline Phosphatase 66 38 - 126 U/L   Total Bilirubin 0.8 0.3 - 1.2 mg/dL   GFR calc non Af Amer 41 (L) >60 mL/min   GFR calc Af Amer 47 (L) >60 mL/min    Comment: (NOTE) The eGFR has been calculated using the CKD EPI equation. This calculation has not been validated in all clinical situations. eGFR's persistently <60 mL/min signify possible Chronic Kidney Disease.    Anion  gap 10 5 - 15    Comment: Performed at [facility], [address] [place], [place] [address]  CBC with Differential     Status: Abnormal   Collection Time: 04/06/17  9:29 AM  Result Value Ref Range   WBC 16.4 (H) 3.6 - 11.0 K/uL   RBC 2.97 (L) 3.80 - 5.20 MIL/uL   Hemoglobin 11.6 (L) 12.0 - 16.0 g/dL   HCT 34.9 (L) 35.0 - 47.0 %   MCV 117.6 (H) 80.0 - 100.0 fL   MCH 39.0 (H) 26.0 - 34.0 pg   MCHC 33.2 32.0 - 36.0 g/dL   RDW 16.4 (H) 11.5 - 14.5 %   Platelets 345 150 - 440 K/uL   Neutrophils Relative % 78 %   Lymphocytes Relative 5 %   Monocytes Relative 15 %   Eosinophils Relative 1 %   Basophils Relative 1 %   Neutro Abs 12.7 (H) 1.4 - 6.5 K/uL   Lymphs Abs 0.8 (L) 1.0 - 3.6 K/uL   Monocytes Absolute 2.5 (H) 0.2 - 0.9 K/uL   Eosinophils Absolute 0.2 0 - 0.7 K/uL   Basophils Absolute 0.2 (H) 0 - 0.1 K/uL   Smear Review MORPHOLOGY UNREMARKABLE     Comment: PLATELET CLUMPS NOTED ON SMEAR, COUNT APPEARS ADEQUATE Performed at [facility], [address] [place], [place] [address]   Lactic acid, plasma     Status: None   Collection Time: 04/06/17 10:54 AM  Result Value Ref Range   Lactic Acid, Venous 0.8 0.5 - 1.9 mmol/L    Comment: Performed at [facility], [address] [place], [place] [address]  Lactic acid, plasma     Status: None   Collection Time: 04/06/17 12:59 PM  Result Value Ref Range   Lactic Acid, Venous 0.9 0.5 - 1.9 mmol/L    Comment: Performed at [facility], [address] [place], [place] [address]   No components found for: ESR, C REACTIVE PROTEIN MICRO: Recent Results (from the past 720 hour(s))  Urine culture     Status: Abnormal   Collection Time: 03/20/17  1:55 PM  Result Value Ref Range Status   Specimen Description   Final    URINE, RANDOM Performed at [facility], [address], [place], [place] [address]    Special Requests   Final    NONE Performed at [facility], [address], [place], [place] [address]    Culture MULTIPLE SPECIES PRESENT, SUGGEST RECOLLECTION (A)  Final   Report Status 03/21/2017 FINAL  Final  Urine Culture     Status: Abnormal   Collection Time: 03/22/17  4:36 AM  Result Value Ref Range Status   Specimen Description   Final    URINE, RANDOM Performed at [facility], [address] [place], [place] [address]    Special Requests   Final    NONE Performed at [facility], [address] [place], [place] [address]    Culture MULTIPLE SPECIES PRESENT, SUGGEST RECOLLECTION (A)  Final   Report Status 03/23/2017 FINAL  Final  Blood culture (routine x 2)     Status: None   Collection Time: 03/26/17 11:51 AM  Result Value Ref Range Status   Specimen Description BLOOD LFOA  Final   Special Requests   Final    BOTTLES DRAWN AEROBIC AND ANAEROBIC Blood Culture adequate volume   Culture   Final    NO GROWTH 5 DAYS Performed at [facility], [address] [place], [place] [address]    Report Status 03/31/2017 FINAL  Final  Blood culture (routine x 2)     Status: None   Collection Time: 03/26/17 11:51 AM  Result Value Ref Range Status   Specimen Description BLOOD LEFT HAND  Final   Special Requests   Final    BOTTLES DRAWN AEROBIC AND ANAEROBIC Blood Culture results may not be optimal due to an excessive volume of blood received in culture bottles   Culture   Final    NO GROWTH 5 DAYS Performed at [facility], [address] [place], [place] [address]    Report Status 03/31/2017 FINAL  Final  MRSA PCR Screening     Status: None   Collection Time: 03/26/17  5:31 PM  Result Value Ref Range Status   MRSA by PCR NEGATIVE NEGATIVE Final    Comment:        The GeneXpert MRSA Assay (FDA approved for NASAL specimens only), is one component of a comprehensive MRSA colonization surveillance program. It is not intended to diagnose MRSA infection nor to guide or monitor treatment for MRSA infections. Performed at [facility], [address], [place], [place] [address]   Aerobic/Anaerobic Culture (surgical/deep wound)     Status: None   Collection Time: 04/01/17 12:38 PM  Result Value Ref Range Status   Specimen Description WOUND FOOT RIGHT  Final   Special Requests   Final    Normal Performed at [facility], [address] [place], [place] [address]    Gram Stain   Final    FEW WBC PRESENT, PREDOMINANTLY PMN NO ORGANISMS SEEN    Culture   Final    No growth aerobically or anaerobically. Performed at [facility], [place] [address] [place],  [address]    Report Status 04/06/2017 FINAL  Final    IMAGING: Dg Chest 2 View  Result Date: 03/19/2017 CLINICAL DATA:  Elevated white cell count. History of breast cancer. EXAM: CHEST  2 VIEW COMPARISON:  Chest and CT chest 06/23/2016 FINDINGS: Heart size and pulmonary vascularity are normal probable emphysematous changes in the lungs. Peribronchial thickening consistent with chronic bronchitis. No airspace disease or consolidation. No blunting of costophrenic angles. No pneumothorax. Mediastinal contours appear intact. Calcification of the aorta. Degenerative changes in the spine. IMPRESSION: Mild emphysematous and chronic bronchitic changes in the lungs. No evidence of active pulmonary disease. Aortic atherosclerosis. Electronically Signed   By: Lucienne Capers M.D.   On: 03/19/2017 00:59   Dg Shoulder Right  Result Date:  03/18/2017 CLINICAL DATA:  [place] off of bus EXAM: RIGHT SHOULDER - 2+ VIEW COMPARISON:  None. FINDINGS: No fracture or malalignment. AC joint degenerative change. Mild glenohumeral degenerative change. Right lung apex is clear IMPRESSION: No acute osseous abnormality Electronically Signed   By: Donavan Foil M.D.   On: 03/18/2017 19:47   Dg Wrist Complete Right  Result Date: 03/18/2017 CLINICAL DATA:  [address] [address] off bus, wrist deformity EXAM: RIGHT WRIST - COMPLETE 3+ VIEW COMPARISON:  None. FINDINGS: No acute fracture or malalignment. Patient is status post prior surgical plate and multiple screw fixation of the distal radius for old fracture. Marked arthritis at the first [facility] joint and at the distal scaphoid. No radiopaque foreign body. IMPRESSION: Postsurgical changes of the distal radius. No definite acute osseous abnormality Electronically Signed   By: Donavan Foil M.D.   On: 03/18/2017 19:40   Dg Ankle 2 Views  Right  Result Date: 03/18/2017 CLINICAL DATA:  [place] off of bus EXAM: RIGHT ANKLE - 2 VIEW COMPARISON:  None. FINDINGS: No fracture or malalignment. Dorsal spurs off the navicular and talus. Ankle mortise is symmetric. Gas in the dorsal soft tissues of the mid to distal foot IMPRESSION: 1. No acute osseous abnormality 2. Gas in the dorsal soft tissues of the mid to distal foot Electronically Signed   By: Donavan Foil M.D.   On: 03/18/2017 19:46   Ct Head Wo Contrast  Result Date: 03/18/2017 CLINICAL DATA:  Follow-up off of bus.  Neck pain. EXAM: CT HEAD WITHOUT CONTRAST CT CERVICAL SPINE WITHOUT CONTRAST TECHNIQUE: Multidetector CT imaging of the head and cervical spine was performed following the standard protocol without intravenous contrast. Multiplanar CT image reconstructions of the cervical spine were also generated. COMPARISON:  Head CT 11/26/2013 FINDINGS: CT HEAD FINDINGS Brain: No intracranial hemorrhage. No parenchymal contusion. No midline shift or mass effect. Basilar cisterns are  patent. No skull base fracture. No fluid in the paranasal sinuses or mastoid air cells. Orbits are normal. There are periventricular and subcortical white matter hypodensities. Generalized cortical atrophy. Vascular: No hyperdense vessel or unexpected calcification. Skull: No skull fracture. Small scalp hematoma over the RIGHT frontal bone. The Sinuses/Orbits: No acute finding. Other: None CT CERVICAL SPINE FINDINGS Alignment: Normal alignment of the cervical vertebral bodies. Skull base and vertebrae: Normal craniocervical junction. No loss of vertebral body height or disc height. Normal facet articulation. No evidence of fracture. Soft tissues and spinal canal: No prevertebral soft tissue swelling. No perispinal or epidural hematoma. Disc levels:  Endplate spurring and disc space narrowing from C5-C7. Upper chest: Clear Other: None IMPRESSION: 1. No intracranial trauma. 2. Small RIGHT frontal scalp hematoma without fracture. 3. Chronic atrophy and white matter microvascular disease. 4. No cervical spine fracture. 5. Mild multilevel disc osteophytic disease. Electronically Signed   By: Suzy Bouchard M.D.   On: 03/18/2017 19:26   Ct Cervical Spine Wo Contrast  Result Date: 03/18/2017 CLINICAL DATA:  Follow-up off of bus.  Neck pain. EXAM: CT HEAD WITHOUT CONTRAST CT CERVICAL SPINE WITHOUT CONTRAST TECHNIQUE: Multidetector CT imaging of the head and cervical spine was performed following the standard protocol without intravenous contrast. Multiplanar CT image reconstructions of the cervical spine were also generated. COMPARISON:  Head CT 11/26/2013 FINDINGS: CT HEAD FINDINGS Brain: No intracranial hemorrhage. No parenchymal contusion. No midline shift or mass effect. Basilar cisterns are patent. No skull base fracture. No fluid in the paranasal sinuses or mastoid air cells. Orbits are normal. There are periventricular and subcortical white matter hypodensities. Generalized cortical atrophy. Vascular: No  hyperdense vessel or unexpected calcification. Skull: No skull fracture. Small scalp hematoma over the RIGHT frontal bone. The Sinuses/Orbits: No acute finding. Other: None CT CERVICAL SPINE FINDINGS Alignment: Normal alignment of the cervical vertebral bodies. Skull base and vertebrae: Normal craniocervical junction. No loss of vertebral body height or disc height. Normal facet articulation. No evidence of fracture. Soft tissues and spinal canal: No prevertebral soft tissue swelling. No perispinal or epidural hematoma. Disc levels:  Endplate spurring and disc space narrowing from C5-C7. Upper chest: Clear Other: None IMPRESSION: 1. No intracranial trauma. 2. Small RIGHT frontal scalp hematoma without fracture. 3. Chronic atrophy and white matter microvascular disease. 4. No cervical spine fracture. 5. Mild multilevel disc osteophytic disease. Electronically Signed   By: Suzy Bouchard M.D.   On: 03/18/2017 19:26   Dg Hand 2 View Right  Result Date: 03/18/2017  CLINICAL DATA:  [place] off bus with wrist deformity EXAM: RIGHT HAND - 2 VIEW COMPARISON:  None. FINDINGS: No fracture is seen. Radial deviation at the second DIP joint. Arthritis second through fifth DIP joints with small suspected erosions at the heads of the second third and fourth middle phalanges. Mild arthritis at the second third and fourth PIP joints. Moderate arthritis at the base of the first [facility] joint and radial aspect of the wrist. IMPRESSION: 1. No acute fracture is seen 2. Arthritis at the DIP and PIP joints as well as the radial aspect of the wrist; small erosive changes are suspected, query inflammatory arthritis Electronically Signed   By: Donavan Foil M.D.   On: 03/18/2017 19:42   Mr Foot Right Wo Contrast  Result Date: 03/30/2017 CLINICAL DATA:  Cellulitis of the toe.  Pain. EXAM: MRI OF THE RIGHT FOREFOOT WITHOUT CONTRAST TECHNIQUE: Multiplanar, multisequence MR imaging of the right forefoot was performed. No intravenous contrast  was administered. COMPARISON:  None. FINDINGS: Bones/Joint/Cartilage Nondisplaced fracture at the lateral base of the first proximal phalanx with mild surrounding marrow edema. Nondisplaced oblique fracture of the distal shaft of the first metatarsal with surrounding marrow edema. Nondisplaced fracture of the second metatarsal neck with marrow edema in the second metatarsal shaft and second metatarsal head. Nondisplaced fracture of third metatarsal head. Severe marrow edema with a linear component involving the distal anterior calcaneus adjacent to the calcaneocuboid joint partially visualized most consistent with a nondisplaced fracture. No periosteal reaction or bone destruction. Normal alignment. No joint effusion. Ligaments Collateral ligaments are intact.  Lisfranc ligament is intact Muscles and Tendons Flexor, peroneal and extensor compartment tendons are intact. Muscles are normal. Soft tissue No fluid collection or hematoma. No soft tissue mass. Soft tissue edema along the dorsal aspect of the foot which may be reactive secondary to venous insufficiency versus cellulitis. IMPRESSION: 1. Nondisplaced fracture at the lateral base of the first proximal phalanx with mild surrounding marrow edema. 2. Nondisplaced oblique fracture of the distal shaft of the first metatarsal with surrounding marrow edema. 3. Nondisplaced fracture of the second metatarsal neck with marrow edema in the second metatarsal shaft and second metatarsal head. 4. Nondisplaced fracture of third metatarsal head. 5. Nondisplaced fracture of the distal anterior calcaneus at the calcaneocuboid joint. 6. No evidence of osteomyelitis. Electronically Signed   By: Kathreen Devoid   On: 03/30/2017 16:29   Dg Foot 2 Views Right  Result Date: 03/26/2017 CLINICAL DATA:  History of a fall 8 days ago resulting in lacerations over the dorsum of the foot requiring stitches. Patient also had an acute fracture of the base of the proximal phalanx of the great  toe. The patient now is complaining of increased foot pain, erythema, and swelling. Clinical suspicion of cellulitis. EXAM: RIGHT FOOT - 2 VIEW COMPARISON:  Right foot series of March 18, 2017 FINDINGS: The displaced fracture through the lateral aspect of the base of the fifth metatarsal is again demonstrated and appears stable. The second metatarsal neck fracture is not well demonstrated. The other phalanges and metatarsals are intact. The tarsal bones exhibit no acute abnormalities. There is mild soft tissue swelling over the forefoot and midfoot. No soft tissue gas collections or foreign bodies are observed. IMPRESSION: Mild soft tissue swelling may reflect cellulitis. No plain film evidence of osteomyelitis. There are post traumatic changes of the base of the proximal phalanx of the great toe and possibly of the neck of the second metatarsal as described. Electronically Signed  By: Taim Wurm  [place] M.D.   On: 03/26/2017 12:37   Dg Foot 2 Views Right  Result Date: 03/18/2017 CLINICAL DATA:  [place] off bus with bruising EXAM: RIGHT FOOT - 2 VIEW COMPARISON:  None. FINDINGS: Acute, mildly displaced intra-articular fracture at the base of the first proximal phalanx. Suspected nondisplaced fracture at the neck of the second metatarsal. No subluxation. Moderate gas within the dorsal soft tissues with soft tissue swelling present. IMPRESSION: 1. Acute displaced intra-articular fracture at the base of the first proximal phalanx with suspected nondisplaced fracture at the neck of the second metatarsal 2. Dorsal soft tissue swelling with moderate soft tissue gas; correlate with physical exam for open wound or laceration Electronically Signed   By: Donavan Foil M.D.   On: 03/18/2017 19:44    Assessment:   AJIAH MCGLINN is a 84 y.o. female with multiple fractures of R foot after a fall now admitted after recent dc on doxy with progressive worsening and now an open wound. At last admission was treated with vanco  and ctx and improved. Had on 2/2  MRI showing no evidence osteomyelitis or abscess.  Cx last admission neg. Now has obvious open wound with some necrotic tissue and drainage. Xray pending.  I have concern for PAD as wel.  Recommendations Restart vanco and ctx Culture sent Podiatry consult. Consider vascular consult  Thank you very much for allowing me to participate in the care of this patient. Please call with questions.   Cheral Marker. Ola Spurr, MD

## 2017-04-06 NOTE — Clinical Social Work Note (Signed)
Clinical Social Work Assessment  Patient Details  Name: [name] [name] MRN: [id number] Date of Birth: [birth year]  Date of referral:  04/06/17               Reason for consult:  Other (Comment Required)(From [employer])                Permission sought to share information with:  Chartered certified accountant granted to share information::  Yes, Verbal Permission Granted  Name::      IT sales professional::   [place]   Relationship::     Contact Information:     Housing/Transportation Living arrangements for the past 2 months:  [place], [place] of Information:  Facility, Siblings Patient Interpreter Needed:  None Criminal Activity/Legal Involvement Pertinent to Current Situation/Hospitalization:  No - Comment as needed Significant Relationships:  Siblings Lives with:  Facility Resident Do you feel safe going back to the place where you live?  Yes Need for family participation in patient care:  Yes (Comment)  Care giving concerns:  Patient came to [facility] from [facility] where she is at for short term rehab.    Social Worker assessment / plan:  Holiday representative (CSW) is familiar with patinet and her sister [name] from previous admission. Patient is at [facility] for short term rehab and was sent to [facility] from the podiatrist office today. CSW contacted [name] patient's sister who reported that she will pay for a bed hold to keep patient's bed at [facility]. Sister requested for patient to return to [place]. CSW explained to sister that a new [facility] authorization will be needed. Sister verbalized her understanding. Per [facility] admissions coordinator at [facility] patient can return to [facility] when stable. FL2 complete. CSW will continue to follow and assist as needed.     Employment status:  Disabled (Comment on whether or not currently receiving Disability), Retired Office manager PT Recommendations:  Not assessed at this time Information / Referral to community resources:  [place]  Patient/Family's Response to care:  Patient's sister requested for patient to return to [place].   Patient/Family's Understanding of and Emotional Response to Diagnosis, Current Treatment, and Prognosis:  Patient's sister was very pleasant and thanked CSW for assistance.   Emotional Assessment Appearance:  Appears stated age Attitude/Demeanor/Rapport:    Affect (typically observed):  Pleasant Orientation:  Oriented to Self, Oriented to Place, Fluctuating Orientation (Suspected and/or reported Sundowners) Alcohol / Substance use:  Not Applicable Psych involvement (Current and /or in the community):  No (Comment)  Discharge Needs  Concerns to be addressed:  Discharge Planning Concerns Readmission within the last 30 days:  No Current discharge risk:  Dependent with Mobility Barriers to Discharge:  Continued Medical Work up   [employer], Veronia Beets, LCSW 04/06/2017, 4:46 PM

## 2017-04-06 NOTE — Progress Notes (Signed)
Pharmacy Antibiotic Note  [name] [name] is a 84 y.o. female admitted on 04/06/2017 with deep-seated foot infection and suspected abscess.  Pharmacy has been consulted for Vancomycin and Ceftriaxone dosing.  Patient received vancomycin 1000mg  once and ceftriaxone 1g once in ED.   Plan: Will start Vancomycin 750mg  Q24H. Goal trought of 15-20.  Will start Ceftriaxone 1g Q24H.   Pharmacy will continue to monitor and adjust as needed.   Ke=0.031 T1/2= 22 hr Vd=40 Cmin= 17 Stacked dose @ 13 hr  Height: 5\' 3"  (160 cm) Weight: 145 lb (65.8 kg) IBW/kg (Calculated) : 52.4  Temp (24hrs), Avg:98.5 F (36.9 C), Min:98.5 F (36.9 C), Max:98.5 F (36.9 C)  Recent Labs  Lab 03/31/17 0324 04/02/17 0121 04/06/17 0929 04/06/17 1054 04/06/17 1259  WBC 13.5*  --  16.4*  --   --   CREATININE 0.63 0.60 1.19*  --   --   LATICACIDVEN  --   --   --  0.8 0.9  VANCOTROUGH  --  9*  --   --   --     Estimated Creatinine Clearance: 32.1 mL/min (A) (by C-G formula based on SCr of 1.19 mg/dL (H)).    Allergies  Allergen Reactions  . Atorvastatin Other (See Comments)    Myositis  . Bisphosphonates Other (See Comments)    GI upset  . Codeine Other (See Comments)  . Memantine Other (See Comments)  . Omeprazole Nausea Only  . Other Other (See Comments)    Leg cramps GI upset  . Salsalate Other (See Comments)  . Sulfa Antibiotics     Pt doesn't remember  . Penicillins Itching    Has patient had a PCN reaction causing immediate rash, facial/tongue/throat swelling, SOB or lightheadedness with hypotension: Yes Has patient had a PCN reaction causing severe rash involving mucus membranes or skin necrosis: No Has patient had a PCN reaction that required hospitalization: No Has patient had a PCN reaction occurring within the last 10 years: No If all of the above answers are "NO", then may proceed with Cephalosporin use.    Antimicrobials this admission: Vancomycin 2/8 >> Ceftriaxone 2/8  >>  Dose adjustments this admission:   Microbiology results: BCx 2/8 >> NGTD Wound Cx 2/8 >> sent   Thank you for allowing pharmacy to be a part of this patient's care.  Lendon Ka, PharmD Pharmacy Resident 04/06/2017 4:44 PM

## 2017-04-06 NOTE — H&P (Signed)
[facility] [place] NAME: [name] [name]    MR#:  [id number]  DATE OF BIRTH:  Aug 31, 1930  DATE OF ADMISSION:  04/06/2017  PRIMARY CARE PHYSICIAN: Idelle Crouch, MD   REQUESTING/REFERRING PHYSICIAN:   CHIEF COMPLAINT:   Chief Complaint  Patient presents with  . Cellulitis    HISTORY OF PRESENT ILLNESS: [name] [name]  is a 84 y.o. female with a known history per below, recent hospital discharge for deep-seated right foot infection, sent home on p.o. doxycycline, returns today with worsening right foot infection, white count of 16,000, patient seen by infectious disease last admission-seen again in the emergency room/recommended IV vancomycin/Rocephin with PICC line placement, podiatry to see for probable need for I&D/debridement, patient in no apparent distress, resting comfortably in bed, patient is now being admitted for acute deep-seated right foot infection/probable abscess.  PAST MEDICAL HISTORY:   Past Medical History:  Diagnosis Date  . breast cancer   . Fibrocystic breast disease    Severe; status post bilateral mastectomies and implants  . GERD (gastroesophageal reflux disease)    Severe; manifested vocal cord irritation, status-post Nissen fundcoplication in 0/1601  . History of ataxia    with questionable right upper and lower  . History of breast cancer   . History of exertional chest pain    with her last stress echo in 09/1999 being normal  . History of migraine    vascular  . History of shingles   . History of syncope 12/1999   With negative cardiac workup to include an echocardiogram, which revealed only mild to moderate MR, a CT scan of the chest, which was negative, and a Cardiolite stress test, which was normal  . Hyperlipidemia, unspecified   . Hypertension    CTA 05/2004 showed no renovascular cause  . Mild dementia   . Muscle atrophy    Positive FANA and anti- SCL70 antibodies; S/p biopsy without eosinic  fascitis; Remicade discontinued  . Osteoarthritis    a. Cervical spine. b. Lumbar spine/spinal stenosis. [facility])  . Osteoporosis    a. Fosamax b. Right wrist fracture. c. Sternal fracture ([place])  . Rheumatoid arthritis without elevated rheumatoid factor (HCC)    a. Methotrexate. b. S/p Remicade. c. Seronegative. d. S/p Plaquenil. [facility])    PAST SURGICAL HISTORY:  Past Surgical History:  Procedure Laterality Date  . APPENDECTOMY    . CHOLECYSTECTOMY    . LAPAROSCOPIC NISSEN FUNDOPLICATION  10/3233  . LUMBAR LAMINECTOMY     for spinal stenosis  . MASTECTOMY Bilateral    with implants  . ORIF DISTAL RADIUS FRACTURE Right 07/27/2008   with volar plate  . TOTAL ABDOMINAL HYSTERECTOMY W/ BILATERAL SALPINGOOPHORECTOMY    . VASCULAR SURGERY      SOCIAL HISTORY:  Social History   Tobacco Use  . Smoking status: Never Smoker  . Smokeless tobacco: Never Used  Substance Use Topics  . Alcohol use: No    FAMILY HISTORY:  Family History  Problem Relation Age of Onset  . Heart failure Mother   . Hypertension Mother   . Cancer Father   . Heart attack Sister     DRUG ALLERGIES:  Allergies  Allergen Reactions  . Atorvastatin Other (See Comments)    Myositis  . Bisphosphonates Other (See Comments)    GI upset  . Codeine Other (See Comments)  . Memantine Other (See Comments)  . Omeprazole Nausea Only  . Other Other (See Comments)    Leg  cramps GI upset  . Salsalate Other (See Comments)  . Sulfa Antibiotics     Pt doesn't remember  . Penicillins Itching    Has patient had a PCN reaction causing immediate rash, facial/tongue/throat swelling, SOB or lightheadedness with hypotension: Yes Has patient had a PCN reaction causing severe rash involving mucus membranes or skin necrosis: No Has patient had a PCN reaction that required hospitalization: No Has patient had a PCN reaction occurring within the last 10 years: No If all of the above answers are "NO", then may proceed with  Cephalosporin use.    REVIEW OF SYSTEMS:   CONSTITUTIONAL: No fever, fatigue or weakness.  EYES: No blurred or double vision.  EARS, NOSE, AND THROAT: No tinnitus or ear pain.  RESPIRATORY: No cough, shortness of breath, wheezing or hemoptysis.  CARDIOVASCULAR: No chest pain, orthopnea, edema.  GASTROINTESTINAL: No nausea, vomiting, diarrhea or abdominal pain.  GENITOURINARY: No dysuria, hematuria.  ENDOCRINE: No polyuria, nocturia,  HEMATOLOGY: No anemia, easy bruising or bleeding SKIN: No rash or lesion. MUSCULOSKELETAL: Acute right foot pain    NEUROLOGIC: No tingling, numbness, weakness.  PSYCHIATRY: No anxiety or depression.   MEDICATIONS AT HOME:  Prior to Admission medications   Medication Sig Start Date End Date Taking? Authorizing Provider  acetaminophen (TYLENOL) 650 MG CR tablet Take 1,300 mg by mouth 2 (two) times daily.    Yes [provider]  amLODipine (NORVASC) 5 MG tablet Take 10 mg by mouth daily. 2 tabs   Yes [provider]  Ascorbic Acid (VITAMIN C) 1000 MG tablet Take 1,000 mg by mouth daily.   Yes [provider]  aspirin EC 81 MG tablet Take 81 mg by mouth daily.   Yes [provider]  doxycycline (VIBRAMYCIN) 100 MG capsule Take 1 capsule by mouth 2 (two) times daily. 04/03/17  Yes [provider]  folic acid (FOLVITE) 1 MG tablet Take 1 mg by mouth daily.   Yes [provider]  isosorbide mononitrate (IMDUR) 30 MG 24 hr tablet Take 30 mg by mouth daily.    Yes [provider]  losartan (COZAAR) 100 MG tablet Take 100 mg by mouth daily.    Yes [provider]  meloxicam (MOBIC) 7.5 MG tablet Take 1 tablet (7.5 mg total) by mouth daily. 04/04/17  Yes Salary, Avel Peace, MD  methotrexate (RHEUMATREX) 2.5 MG tablet Take 6 tablets by mouth every Friday.  10/28/15  Yes [provider]  metoprolol (LOPRESSOR) 100 MG tablet Take 100 mg by mouth 2 (two) times daily.    Yes [provider]  mirtazapine (REMERON) 7.5 MG tablet Take 7.5 mg by mouth at bedtime.   Yes [provider]  Multiple Vitamin (MULTI-VITAMINS) TABS Take 1 tablet by mouth daily.   Yes [provider]  oxyCODONE (OXY IR/ROXICODONE) 5 MG immediate release tablet Take 1 tablet (5 mg total) by mouth every 4 (four) hours as needed for severe pain. 04/04/17  Yes Toni Arthurs, NP  sertraline (ZOLOFT) 100 MG tablet Take 100 mg by mouth daily.   Yes [provider]  traMADol (ULTRAM) 50 MG tablet Take 1 tablet (50 mg total) by mouth every 6 (six) hours as needed for moderate pain. 04/04/17   Toni Arthurs, NP      PHYSICAL EXAMINATION:   VITAL SIGNS: Blood pressure (!) 146/61, pulse 70, temperature 98.5 F (36.9 C), temperature source Oral, resp. rate 18, height 5\' 3"  (1.6 m), weight 65.8 kg (145 lb), SpO2 94 %.  GENERAL:  84 y.o.-year-old patient lying in the bed with no acute distress.  Frail-appearing EYES: Pupils equal, round, reactive to light and accommodation. No scleral icterus. Extraocular muscles intact.  HEENT: Head atraumatic, normocephalic. Oropharynx and nasopharynx clear.  NECK:  Supple, no jugular venous distention. No thyroid enlargement, no tenderness.  LUNGS: Normal breath sounds bilaterally, no wheezing, rales,rhonchi or crepitation. No use of accessory muscles of respiration.  CARDIOVASCULAR: S1, S2 normal. No murmurs, rubs, or gallops.  ABDOMEN: Soft, nontender, nondistended. Bowel sounds present. No organomegaly or mass.  EXTREMITIES: No pedal edema, cyanosis, or clubbing.  NEUROLOGIC: Cranial nerves II through XII are intact. MAES. Gait not checked.  PSYCHIATRIC: The patient is alert and oriented x 3.  SKIN: No obvious rash, lesion.  Acute deep-seated right foot infection/probable foot abscess with associated cellulitis  LABORATORY PANEL:   CBC Recent Labs  Lab 03/31/17 0324 04/06/17 0929  WBC 13.5* 16.4*  HGB 11.8* 11.6*  HCT 35.8 34.9*  PLT 345 345   MCV 117.5* 117.6*  MCH 38.6* 39.0*  MCHC 32.8 33.2  RDW 15.5* 16.4*  LYMPHSABS 1.4 0.8*  MONOABS 2.0* 2.5*  EOSABS 0.2 0.2  BASOSABS 0.1 0.2*   ------------------------------------------------------------------------------------------------------------------  Chemistries  Recent Labs  Lab 03/31/17 0324 04/02/17 0121 04/06/17 0929  NA 137  --  141  K 3.3*  --  4.7  CL 104  --  108  CO2 24  --  23  GLUCOSE 105*  --  96  BUN 19  --  34*  CREATININE 0.63 0.60 1.19*  CALCIUM 8.3*  --  9.2  AST  --   --  20  ALT  --   --  14  ALKPHOS  --   --  66  BILITOT  --   --  0.8   ------------------------------------------------------------------------------------------------------------------ estimated creatinine clearance is 32.1 mL/min (A) (by C-G formula based on SCr of 1.19 mg/dL (H)). ------------------------------------------------------------------------------------------------------------------ No results for input(s): TSH, T4TOTAL, T3FREE, THYROIDAB in the last 72 hours.  Invalid input(s): FREET3   Coagulation profile No results for input(s): INR, PROTIME in the last 168 hours. ------------------------------------------------------------------------------------------------------------------- No results for input(s): DDIMER in the last 72 hours. -------------------------------------------------------------------------------------------------------------------  Cardiac Enzymes No results for input(s): CKMB, TROPONINI, MYOGLOBIN in the last 168 hours.  Invalid input(s): CK ------------------------------------------------------------------------------------------------------------------ Invalid input(s): POCBNP  ---------------------------------------------------------------------------------------------------------------  Urinalysis    Component Value Date/Time   COLORURINE YELLOW (A) 03/26/2017 1642   APPEARANCEUR HAZY (A) 03/26/2017 1642   APPEARANCEUR Clear  10/01/2013 1945   LABSPEC 1.013 03/26/2017 1642   LABSPEC 1.006 10/01/2013 1945   PHURINE 5.0 03/26/2017 1642   GLUCOSEU NEGATIVE 03/26/2017 1642   GLUCOSEU Negative 10/01/2013 1945   HGBUR NEGATIVE 03/26/2017 1642   BILIRUBINUR NEGATIVE 03/26/2017 1642   BILIRUBINUR Negative 10/01/2013 1945   KETONESUR NEGATIVE 03/26/2017 1642   PROTEINUR NEGATIVE 03/26/2017 1642   UROBILINOGEN 0.2 12/09/2007 0939   NITRITE NEGATIVE 03/26/2017 1642   LEUKOCYTESUR NEGATIVE 03/26/2017 1642   LEUKOCYTESUR Negative 10/01/2013 1945     RADIOLOGY: No results found.  EKG: Orders placed or performed during the hospital encounter of 06/23/16  . ED EKG  . ED EKG  . EKG 12-Lead  . EKG 12-Lead  . EKG 12-Lead  . EKG 12-Lead    IMPRESSION AND PLAN: 1 Acute deep-seated right foot infection/probable right foot abscess with surrounding cellulitis Very recent hospital discharge for same, failed outpatient doxycycline Admit to regular nursing floor bed, infectious disease following, consult podiatry for probable need for debridement/I&D, IV  vancomycin/Rocephin for now, check cultures, PICC line placement, and continue close medical monitoring  2 acute pain syndrome Secondary to above as well as subacute right foot fractures Stable on current pain regiment which will be continued Podiatry to see  3 chronic benign essential retention Stable on current regiment  4 chronic poor appetite ContinueMegace bid  Full code Condition stable Disposition back to inpatient rehab and 2-3 days barring any complications   All the records are reviewed and case discussed with ED provider. Management plans discussed with the patient, family and they are in agreement.  CODE STATUS: Code Status History    Date Active Date Inactive Code Status Order ID Comments User Context   03/26/2017 13:19 04/03/2017 20:42 DNR [id number]  Hillary Bow, MD ED   11/09/2015 13:04 11/10/2015 18:05 Full Code [id number]  Fritzi Mandes, MD  Inpatient    Questions for Most Recent Historical Code Status (Order [id number])    Question Answer Comment   In the event of cardiac or respiratory ARREST Do not call a "code blue"    In the event of cardiac or respiratory ARREST Do not perform Intubation, CPR, defibrillation or ACLS    In the event of cardiac or respiratory ARREST Use medication by any route, position, wound care, and other measures to relive pain and suffering. May use oxygen, suction and manual treatment of airway obstruction as needed for comfort.        TOTAL TIME TAKING CARE OF THIS PATIENT: 45 minutes.    Avel Peace Salary M.D on 04/06/2017   Between 7am to 6pm - Pager - [phone]  After 6pm go to [email or web address] - password [email or web address] [place] [facility]  Office  [phone]  CC: Primary care physician; Idelle Crouch, MD   Note: This dictation was prepared with Dragon dictation along with smaller phrase technology. Any transcriptional errors that result from this process are unintentional.

## 2017-04-06 NOTE — ED Notes (Signed)
Labs and cultures sent at this time.

## 2017-04-06 NOTE — Progress Notes (Signed)
HPI: 84 year old female presents today with her caregiver for evaluation of a right foot ulcer with infection.  The patient's caregiver is unaware of the complete history regarding her right foot but she notes that she has been in and out of the hospital and the ulcer has been present for approximately 2-3 weeks.  She is currently taking doxycycline.  The patient currently is not verbal and wheelchair-bound.  The patient's caregiver states that the foot is very painful to touch.  She presents today for further treatment and evaluation  Past Medical History:  Diagnosis Date  . breast cancer   . Fibrocystic breast disease    Severe; status post bilateral mastectomies and implants  . GERD (gastroesophageal reflux disease)    Severe; manifested vocal cord irritation, status-post Nissen fundcoplication in 10/3265  . History of ataxia    with questionable right upper and lower  . History of breast cancer   . History of exertional chest pain    with her last stress echo in 09/1999 being normal  . History of migraine    vascular  . History of shingles   . History of syncope 12/1999   With negative cardiac workup to include an echocardiogram, which revealed only mild to moderate MR, a CT scan of the chest, which was negative, and a Cardiolite stress test, which was normal  . Hyperlipidemia, unspecified   . Hypertension    CTA 05/2004 showed no renovascular cause  . Mild dementia   . Muscle atrophy    Positive FANA and anti- SCL70 antibodies; S/p biopsy without eosinic fascitis; Remicade discontinued  . Osteoarthritis    a. Cervical spine. b. Lumbar spine/spinal stenosis. [facility])  . Osteoporosis    a. Fosamax b. Right wrist fracture. c. Sternal fracture ([place])  . Rheumatoid arthritis without elevated rheumatoid factor (HCC)    a. Methotrexate. b. S/p Remicade. c. Seronegative. d. S/p Plaquenil. [facility])        Physical Exam: General: The patient is in no acute distress.  Dermatology: Large  open draining ulceration noted to the dorsal aspect of the right foot.  Wound measures approximately 5.0 x 5.0 cm.  To the noted ulceration there is a heavy amount of serosanguineous drainage noted.  Wound base is mostly fibrotic with a large amount of slough fibrin and necrotic tissue.  Periwound is very erythematous and edematous.  Vascular: Pedal pulses are currently nonpalpable possibly due to edema and cellulitis.  Right foot and ankle is extremely warm compared to the contralateral limb.  There is a heavy amount of edema and erythema noted diffusely throughout the right foot extending to the level of the ankle.  Neurological: Epicritic and protective threshold grossly intact bilaterally.   Musculoskeletal Exam: Patient is wheelchair-bound  Radiographic Exam:  Normal osseous mineralization. Joint spaces preserved.  There is a very suspicious portion of the proximal phalanx of the great toe lateral aspect which does have some cortical erosion and fragmentation localized to the base of the proximal phalanx  Assessment: -Right foot cellulitis -Ulcer right foot fat layer exposed -Possible peripheral vascular disease right lower extremity -Suspicious cortical erosion with fragmentation base of proximal phalanx right great toe possible for osteomyelitis  Plan of Care:  -Patient was evaluated today.  X-rays reviewed today.  Betadine soaked wet-to-dry dressings were applied today in the office -After examining the patient I recommend she goes immediately to the [facility] emergency department for further treatment and evaluation.  Patient will likely need IV antibiotics, surgical incision and  drainage, and possible vascular workup.  Patient's caregiver understands and agrees with the plan of care -Return to clinic as needed      Edrick Kins, DPM [facility]  Dr. Edrick Kins, DPM    [address], [place] [address]                  Office [phone]  Fax [phone]

## 2017-04-06 NOTE — Progress Notes (Signed)
Spoke with York Cerise, RN regarding PICC ordered for patient but no approval yet from infectious disease to place, cultures are pending.  Once ID gives approval [facility] will gladly place PICC.  Patient currently has IV access.  Carolee Rota, RN [facility]

## 2017-04-06 NOTE — ED Triage Notes (Signed)
Pt sent over from SNF for further eval of possible right foot infection.

## 2017-04-07 ENCOUNTER — Inpatient Hospital Stay: Payer: PPO

## 2017-04-07 LAB — CBC
HEMATOCRIT: 33.7 % — AB (ref 35.0–47.0)
HEMOGLOBIN: 10.9 g/dL — AB (ref 12.0–16.0)
MCH: 38.1 pg — ABNORMAL HIGH (ref 26.0–34.0)
MCHC: 32.5 g/dL (ref 32.0–36.0)
MCV: 117.5 fL — ABNORMAL HIGH (ref 80.0–100.0)
Platelets: 349 10*3/uL (ref 150–440)
RBC: 2.87 MIL/uL — ABNORMAL LOW (ref 3.80–5.20)
RDW: 16.2 % — ABNORMAL HIGH (ref 11.5–14.5)
WBC: 15.4 10*3/uL — AB (ref 3.6–11.0)

## 2017-04-07 LAB — BASIC METABOLIC PANEL
ANION GAP: 12 (ref 5–15)
BUN: 26 mg/dL — ABNORMAL HIGH (ref 6–20)
CALCIUM: 8.5 mg/dL — AB (ref 8.9–10.3)
CHLORIDE: 104 mmol/L (ref 101–111)
CO2: 22 mmol/L (ref 22–32)
Creatinine, Ser: 1.07 mg/dL — ABNORMAL HIGH (ref 0.44–1.00)
GFR calc non Af Amer: 46 mL/min — ABNORMAL LOW (ref >60)
GFR, EST AFRICAN AMERICAN: 54 mL/min — AB (ref >60)
Glucose, Bld: 119 mg/dL — ABNORMAL HIGH (ref 65–99)
Potassium: 4.3 mmol/L (ref 3.5–5.1)
SODIUM: 138 mmol/L (ref 135–145)

## 2017-04-07 MED ORDER — GADOBENATE DIMEGLUMINE 529 MG/ML IV SOLN
13.0000 mL | Freq: Once | INTRAVENOUS | Status: AC | PRN
  Administered 2017-04-07: 13 mL via INTRAVENOUS

## 2017-04-07 NOTE — Progress Notes (Signed)
[place] [facility] [place] NAME: [name] [name]    MR#:  [id number]  DATE OF BIRTH:  Dec 16, 1930  SUBJECTIVE:  CHIEF COMPLAINT:   Chief Complaint  Patient presents with  . Cellulitis  Patient without complaint, for MRI of the foot, for I&D of foot abscess on tomorrow  REVIEW OF SYSTEMS:  CONSTITUTIONAL: No fever, fatigue or weakness.  EYES: No blurred or double vision.  EARS, NOSE, AND THROAT: No tinnitus or ear pain.  RESPIRATORY: No cough, shortness of breath, wheezing or hemoptysis.  CARDIOVASCULAR: No chest pain, orthopnea, edema.  GASTROINTESTINAL: No nausea, vomiting, diarrhea or abdominal pain.  GENITOURINARY: No dysuria, hematuria.  ENDOCRINE: No polyuria, nocturia,  HEMATOLOGY: No anemia, easy bruising or bleeding SKIN: No rash or lesion. MUSCULOSKELETAL: No joint pain or arthritis.   NEUROLOGIC: No tingling, numbness, weakness.  PSYCHIATRY: No anxiety or depression.   ROS  DRUG ALLERGIES:   Allergies  Allergen Reactions  . Atorvastatin Other (See Comments)    Myositis  . Bisphosphonates Other (See Comments)    GI upset  . Codeine Other (See Comments)  . Memantine Other (See Comments)  . Omeprazole Nausea Only  . Other Other (See Comments)    Leg cramps GI upset  . Salsalate Other (See Comments)  . Sulfa Antibiotics     Pt doesn't remember  . Penicillins Itching    Has patient had a PCN reaction causing immediate rash, facial/tongue/throat swelling, SOB or lightheadedness with hypotension: Yes Has patient had a PCN reaction causing severe rash involving mucus membranes or skin necrosis: No Has patient had a PCN reaction that required hospitalization: No Has patient had a PCN reaction occurring within the last 10 years: No If all of the above answers are "NO", then may proceed with Cephalosporin use.    VITALS:  Blood pressure (!) 161/59, pulse 70, temperature 98.9 F (37.2 C), temperature source Oral, resp. rate 18,  height 5\' 3"  (1.6 m), weight 65.8 kg (145 lb), SpO2 96 %.  PHYSICAL EXAMINATION:  GENERAL:  84 y.o.-year-old patient lying in the bed with no acute distress.  EYES: Pupils equal, round, reactive to light and accommodation. No scleral icterus. Extraocular muscles intact.  HEENT: Head atraumatic, normocephalic. Oropharynx and nasopharynx clear.  NECK:  Supple, no jugular venous distention. No thyroid enlargement, no tenderness.  LUNGS: Normal breath sounds bilaterally, no wheezing, rales,rhonchi or crepitation. No use of accessory muscles of respiration.  CARDIOVASCULAR: S1, S2 normal. No murmurs, rubs, or gallops.  ABDOMEN: Soft, nontender, nondistended. Bowel sounds present. No organomegaly or mass.  EXTREMITIES: No pedal edema, cyanosis, or clubbing.  NEUROLOGIC: Cranial nerves II through XII are intact. Muscle strength 5/5 in all extremities. Sensation intact. Gait not checked.  PSYCHIATRIC: The patient is alert and oriented x 3.  SKIN: No obvious rash, lesion, or ulcer.   Physical Exam LABORATORY PANEL:   CBC Recent Labs  Lab 04/07/17 0400  WBC 15.4*  HGB 10.9*  HCT 33.7*  PLT 349   ------------------------------------------------------------------------------------------------------------------  Chemistries  Recent Labs  Lab 04/06/17 0929 04/07/17 0400  NA 141 138  K 4.7 4.3  CL 108 104  CO2 23 22  GLUCOSE 96 119*  BUN 34* 26*  CREATININE 1.19* 1.07*  CALCIUM 9.2 8.5*  AST 20  --   ALT 14  --   ALKPHOS 66  --   BILITOT 0.8  --    ------------------------------------------------------------------------------------------------------------------  Cardiac Enzymes No results for input(s): TROPONINI in the last 168  hours. ------------------------------------------------------------------------------------------------------------------  RADIOLOGY:  Mr Foot Right W Wo Contrast  Result Date: 04/07/2017 CLINICAL DATA:  Right foot cellulitis. Evaluate for abscess or  osteomyelitis. EXAM: MRI OF THE RIGHT FOREFOOT WITHOUT AND WITH CONTRAST TECHNIQUE: Multiplanar, multisequence MR imaging of the right forefoot was performed before and after the administration of intravenous contrast. CONTRAST:  44mL MULTIHANCE GADOBENATE DIMEGLUMINE 529 MG/ML IV SOLN COMPARISON:  Right foot x-rays dated April 06, 2017. Right foot MRI dated March 30, 2017. FINDINGS: Bones/Joint/Cartilage Unchanged nondisplaced fracture at the lateral base of the first proximal phalanx. Unchanged nondisplaced fracture of the distal shaft of the first metatarsal, second metatarsal neck and third metatarsal head. Unchanged nondisplaced fracture of the anterior calcaneus adjacent to the calcaneocuboid joint. No periosteal reaction or cortical destruction. Normal alignment. No joint effusion. Ligaments Collateral ligaments are intact.  Lisfranc ligament is intact. Muscles and Tendons Flexor, peroneal and extensor compartment tendons are intact. Mild increased STIR signal within the intrinsic muscles of the forefoot, likely related to diabetic muscle changes. Soft tissue New ill-defined, rim enhancing fluid collection over the dorsum of the lateral foot, extending from the MTP joints to the midfoot. The fluid collection measures approximately 8.6 x 2.3 x 1.6 cm (AP by transverse by CC). This communicates with the overlying skin surface. No soft tissue mass. IMPRESSION: 1. Ill-defined, rim enhancing 8.6 cm fluid collection over the dorsum of the lateral foot, extending from the MTP joints to the midfoot, consistent with abscess. This appears to drain to the overlying skin surface. 2. No evidence of osteomyelitis. 3. Unchanged nondisplaced fractures of the first proximal phalanx, distal first metatarsal shaft, second metatarsal neck, third metatarsal head, and distal anterior calcaneus near the calcaneocuboid joint. Electronically Signed   By: Titus Dubin M.D.   On: 04/07/2017 10:47   Dg Foot Complete  Right  Result Date: 04/06/2017 Please see detailed radiograph report in office note.   ASSESSMENT AND PLAN:  1 Acute deep-seated right foot infection/right foot abscess with surrounding cellulitis Very recent hospital discharge for same, failed outpatient doxycycline ID input appreciated, continue empiric IV vancomycin/Rocephin, follow-up on cultures, podiatry following-tentative plans for I&D of abscess on tomorrow, PICC line placement, and continue close medical monitoring  2acute pain syndrome Controlled on current regiment Secondary to above as well as subacute right foot fractures  3 chronic benign essential retention Stable on current regiment  4 chronic poor appetite Stable ContinueMegace bid  Full code Condition stable Disposition back to inpatient rehab and 1-3 days barring any complications   All the records are reviewed and case discussed with Care Management/Social Workerr. Management plans discussed with the patient, family and they are in agreement.  CODE STATUS: full  TOTAL TIME TAKING CARE OF THIS PATIENT: 35 minutes.     POSSIBLE D/C IN 1-3 DAYS, DEPENDING ON CLINICAL CONDITION.   Avel Peace Joslynne Klatt M.D on 04/07/2017   Between 7am to 6pm - Pager - [phone]  After 6pm go to [email or web address] - password [email or web address] [facility]  Office  [phone]  CC: Primary care physician; Idelle Crouch, MD  Note: This dictation was prepared with Dragon dictation along with smaller phrase technology. Any transcriptional errors that result from this process are unintentional.

## 2017-04-07 NOTE — Anesthesia Preprocedure Evaluation (Addendum)
Anesthesia Evaluation  Patient identified by MRN, date of birth, ID band Patient awake    Reviewed: Allergy & Precautions, NPO status , Patient's Chart, lab work & pertinent test results  History of Anesthesia Complications Negative for: history of anesthetic complications  Airway Mallampati: III  TM Distance: >3 FB Neck ROM: Full    Dental no notable dental hx.    Pulmonary neg pulmonary ROS, neg sleep apnea, neg COPD,    breath sounds clear to auscultation- rhonchi (-) wheezing      Cardiovascular hypertension, Pt. on medications (-) CAD, (-) Past MI, (-) Cardiac Stents and (-) CABG  Rhythm:Regular Rate:Normal - Systolic murmurs and - Diastolic murmurs    Neuro/Psych PSYCHIATRIC DISORDERS Dementia   Neuromuscular disease    GI/Hepatic Neg liver ROS, GERD  ,  Endo/Other  negative endocrine ROSneg diabetes  Renal/GU Renal InsufficiencyRenal disease     Musculoskeletal  (+) Arthritis ,   Abdominal (+) - obese,   Peds  Hematology negative hematology ROS (+)   Anesthesia Other Findings Past Medical History: No date: breast cancer No date: Fibrocystic breast disease     Comment:  Severe; status post bilateral mastectomies and implants No date: GERD (gastroesophageal reflux disease)     Comment:  Severe; manifested vocal cord irritation, status-post               Nissen fundcoplication in 05/7827 No date: History of ataxia     Comment:  with questionable right upper and lower No date: History of breast cancer No date: History of exertional chest pain     Comment:  with her last stress echo in 09/1999 being normal No date: History of migraine     Comment:  vascular No date: History of shingles 12/1999: History of syncope     Comment:  With negative cardiac workup to include an               echocardiogram, which revealed only mild to moderate MR,               a CT scan of the chest, which was negative, and a              Cardiolite stress test, which was normal No date: Hyperlipidemia, unspecified No date: Hypertension     Comment:  CTA 05/2004 showed no renovascular cause No date: Mild dementia No date: Muscle atrophy     Comment:  Positive FANA and anti- SCL70 antibodies; S/p biopsy               without eosinic fascitis; Remicade discontinued No date: Osteoarthritis     Comment:  a. Cervical spine. b. Lumbar spine/spinal stenosis.               ([place]) No date: Osteoporosis     Comment:  a. Fosamax b. Right wrist fracture. c. Sternal fracture               ([place]) No date: Rheumatoid arthritis without elevated rheumatoid factor (HCC)     Comment:  a. Methotrexate. b. S/p Remicade. c. Seronegative. d.               S/p Plaquenil. [facility])   Reproductive/Obstetrics                            Anesthesia Physical Anesthesia Plan  ASA: III  Anesthesia Plan: General   Post-op Pain Management:    Induction: Intravenous  PONV Risk Score and Plan: 2  Airway Management Planned: LMA  Additional Equipment:   Intra-op Plan:   Post-operative Plan:   Informed Consent: I have reviewed the patients History and Physical, chart, labs and discussed the procedure including the risks, benefits and alternatives for the proposed anesthesia with the patient or authorized representative who has indicated his/her understanding and acceptance.   Dental advisory given and Consent reviewed with POA  Plan Discussed with: CRNA and Anesthesiologist  Anesthesia Plan Comments:        Anesthesia Quick Evaluation

## 2017-04-07 NOTE — Progress Notes (Signed)
Patient Demographics  [name] [name], is a 84 y.o. female   MRN: [id number]   DOB - 10-12-1930  Admit Date - 04/06/2017    Outpatient Primary MD for the patient is Idelle Crouch, MD  Consult requested in the Hospital by Salary, Avel Peace, MD, On 04/07/2017   With History of -  Past Medical History:  Diagnosis Date  . breast cancer   . Fibrocystic breast disease    Severe; status post bilateral mastectomies and implants  . GERD (gastroesophageal reflux disease)    Severe; manifested vocal cord irritation, status-post Nissen fundcoplication in 09/1446  . History of ataxia    with questionable right upper and lower  . History of breast cancer   . History of exertional chest pain    with her last stress echo in 09/1999 being normal  . History of migraine    vascular  . History of shingles   . History of syncope 12/1999   With negative cardiac workup to include an echocardiogram, which revealed only mild to moderate MR, a CT scan of the chest, which was negative, and a Cardiolite stress test, which was normal  . Hyperlipidemia, unspecified   . Hypertension    CTA 05/2004 showed no renovascular cause  . Mild dementia   . Muscle atrophy    Positive FANA and anti- SCL70 antibodies; S/p biopsy without eosinic fascitis; Remicade discontinued  . Osteoarthritis    a. Cervical spine. b. Lumbar spine/spinal stenosis. [facility])  . Osteoporosis    a. Fosamax b. Right wrist fracture. c. Sternal fracture ([place])  . Rheumatoid arthritis without elevated rheumatoid factor (HCC)    a. Methotrexate. b. S/p Remicade. c. Seronegative. d. S/p Plaquenil. [facility])      Past Surgical History:  Procedure Laterality Date  . APPENDECTOMY    . CHOLECYSTECTOMY    . LAPAROSCOPIC NISSEN FUNDOPLICATION  18/5631  . LUMBAR LAMINECTOMY     for spinal stenosis  .  MASTECTOMY Bilateral    with implants  . ORIF DISTAL RADIUS FRACTURE Right 07/27/2008   with volar plate  . TOTAL ABDOMINAL HYSTERECTOMY W/ BILATERAL SALPINGOOPHORECTOMY    . VASCULAR SURGERY      in for   Chief Complaint  Patient presents with  . Cellulitis     HPI  [name] [name]  is a 84 y.o. female, patient was admitted to the hospital yesterday because of wound on top of the right foot with increasing redness and cellulitis.  Apparently she had some fractures on the metatarsals 2 3 and #1 back on January 20.  She was hospitalized last week because of swelling and redness MRI showed the fractures but no osteomyelitis or abscess.  She presented back to the hospital yesterday with swelling redness pain and skin breakdown across the top of her foot.    Review of Systems    In addition to the HPI above,  No Fever-chills, No Headache, No changes with Vision or hearing, No problems swallowing food or Liquids, No Chest  pain, Cough or Shortness of Breath, No Abdominal pain, No Nausea or Vommitting, Bowel movements are regular, No Blood in stool or Urine, No dysuria, No new skin rashes or bruises, No new joints pains-aches,  No new weakness, tingling, numbness in any extremity, No recent weight gain or loss, No polyuria, polydypsia or polyphagia, No significant Mental Stressors.  A full 10 point Review of Systems was done, except as stated above, all other Review of Systems were negative.   Social History Social History   Tobacco Use  . Smoking status: Never Smoker  . Smokeless tobacco: Never Used  Substance Use Topics  . Alcohol use: No    Family History Family History  Problem Relation Age of Onset  . Heart failure Mother   . Hypertension Mother   . Cancer Father   . Heart attack Sister    Prior to Admission medications   Medication Sig Start Date End Date Taking? Authorizing Provider  acetaminophen (TYLENOL) 650 MG CR tablet Take 1,300 mg by mouth 2 (two)  times daily.    Yes [provider]  amLODipine (NORVASC) 5 MG tablet Take 10 mg by mouth daily. 2 tabs   Yes [provider]  Ascorbic Acid (VITAMIN C) 1000 MG tablet Take 1,000 mg by mouth daily.   Yes [provider]  aspirin EC 81 MG tablet Take 81 mg by mouth daily.   Yes [provider]  doxycycline (VIBRAMYCIN) 100 MG capsule Take 1 capsule by mouth 2 (two) times daily. 04/03/17  Yes [provider]  folic acid (FOLVITE) 1 MG tablet Take 1 mg by mouth daily.   Yes [provider]  isosorbide mononitrate (IMDUR) 30 MG 24 hr tablet Take 30 mg by mouth daily.    Yes [provider]  losartan (COZAAR) 100 MG tablet Take 100 mg by mouth daily.    Yes [provider]  meloxicam (MOBIC) 7.5 MG tablet Take 1 tablet (7.5 mg total) by mouth daily. 04/04/17  Yes Salary, Avel Peace, MD  methotrexate (RHEUMATREX) 2.5 MG tablet Take 6 tablets by mouth every Friday.  10/28/15  Yes [provider]  metoprolol (LOPRESSOR) 100 MG tablet Take 100 mg by mouth 2 (two) times daily.    Yes [provider]  mirtazapine (REMERON) 7.5 MG tablet Take 7.5 mg by mouth at bedtime.   Yes [provider]  Multiple Vitamin (MULTI-VITAMINS) TABS Take 1 tablet by mouth daily.   Yes [provider]  oxyCODONE (OXY IR/ROXICODONE) 5 MG immediate release tablet Take 1 tablet (5 mg total) by mouth every 4 (four) hours as needed for severe pain. 04/04/17  Yes Toni Arthurs, NP  sertraline (ZOLOFT) 100 MG tablet Take 100 mg by mouth daily.   Yes [provider]  traMADol (ULTRAM) 50 MG tablet Take 1 tablet (50 mg total) by mouth every 6 (six) hours as needed for moderate pain. 04/04/17   Toni Arthurs, NP    Anti-infectives (From admission, onward)   Start     Dose/Rate Route Frequency Ordered Stop   04/07/17 1000  cefTRIAXone (ROCEPHIN) 1 g in dextrose 5 % 50 mL IVPB     1 g 100 mL/hr over 30 Minutes Intravenous Every 24  hours 04/06/17 1549     04/07/17 0200  vancomycin (VANCOCIN) IVPB 750 mg/150 ml premix     750 mg 150 mL/hr over 60 Minutes Intravenous Every 24 hours 04/06/17 1636     04/06/17 1245  vancomycin (VANCOCIN) IVPB 1000  mg/200 mL premix     1 g Intravenous  Once 04/06/17 1241 04/06/17 1352   04/06/17 1245  cefTRIAXone (ROCEPHIN) 1 g in dextrose 5 % 50 mL IVPB     1 g 100 mL/hr over 30 Minutes Intravenous  Once 04/06/17 1241 04/06/17 1352      Scheduled Meds: . amLODipine  10 mg Oral Daily  . aspirin EC  81 mg Oral Daily  . docusate sodium  100 mg Oral BID  . folic acid  1 mg Oral Daily  . heparin  5,000 Units Subcutaneous Q8H  . isosorbide mononitrate  30 mg Oral Daily  . losartan  100 mg Oral Daily  . meloxicam  7.5 mg Oral Daily  . metoprolol tartrate  100 mg Oral BID  . mirtazapine  7.5 mg Oral QHS  . multivitamin with minerals  1 tablet Oral Daily  . sertraline  100 mg Oral Daily  . vitamin C  1,000 mg Oral Daily   Continuous Infusions: . cefTRIAXone (ROCEPHIN)  IV    . vancomycin Stopped (04/07/17 0433)   PRN Meds:.acetaminophen **OR** acetaminophen, bisacodyl, ondansetron **OR** ondansetron (ZOFRAN) IV, oxyCODONE, polyethylene glycol, traMADol  Allergies  Allergen Reactions  . Atorvastatin Other (See Comments)    Myositis  . Bisphosphonates Other (See Comments)    GI upset  . Codeine Other (See Comments)  . Memantine Other (See Comments)  . Omeprazole Nausea Only  . Other Other (See Comments)    Leg cramps GI upset  . Salsalate Other (See Comments)  . Sulfa Antibiotics     Pt doesn't remember  . Penicillins Itching    Has patient had a PCN reaction causing immediate rash, facial/tongue/throat swelling, SOB or lightheadedness with hypotension: Yes Has patient had a PCN reaction causing severe rash involving mucus membranes or skin necrosis: No Has patient had a PCN reaction that required hospitalization: No Has patient had a PCN reaction occurring within the last  10 years: No If all of the above answers are "NO", then may proceed with Cephalosporin use.    Physical Exam  Vitals  Blood pressure (!) 154/60, pulse 78, temperature 99.6 F (37.6 C), temperature source Oral, resp. rate 18, height 5\' 3"  (1.6 m), weight 65.8 kg (145 lb), SpO2 90 %.  Lower Extremity exam:  Vascular: DP pulses are difficult to palpate on the right foot.  Capillary refill times are approximately 2 seconds but the foot is warm.  Dermatological: Patient has a large eschar on top of the right foot about 6 7 cm in diameter.  Difficult to tell what the depth of this is this point.  She does have pain with so is hard to determine the level of fluctuance underneath the area but it does seem to be a little more fluid subcutaneously now than there was yesterday.  Cellulitis is still present has not reduced much but he does show signs of some reduction in the bright red and is more of a dusky red at this point.  Her white count has improved from 16 4015 for  Neurological: Likely intact bilateral and symmetrical.  She does complain of pain with examination of her foot.  Ortho: Patient fell January 20 and had nondisplaced fractures to the first second third metatarsal heads based on MRI from last week.  Data Review  CBC Recent Labs  Lab 04/06/17 0929 04/07/17 0400  WBC 16.4* 15.4*  HGB 11.6* 10.9*  HCT 34.9* 33.7*  PLT 345 349  MCV 117.6* 117.5*  MCH 39.0* 38.1*  MCHC 33.2 32.5  RDW 16.4* 16.2*  LYMPHSABS 0.8*  --   MONOABS 2.5*  --   EOSABS 0.2  --   BASOSABS 0.2*  --    ------------------------------------------------------------------------------------------------------------------  Chemistries  Recent Labs  Lab 04/02/17 0121 04/06/17 0929 04/07/17 0400  NA  --  141 138  K  --  4.7 4.3  CL  --  108 104  CO2  --  23 22  GLUCOSE  --  96 119*  BUN  --  34* 26*  CREATININE 0.60 1.19* 1.07*  CALCIUM  --  9.2 8.5*  AST  --  20  --   ALT  --  14  --   ALKPHOS   --  66  --   BILITOT  --  0.8  --    ---------------------------------------------------------------------------------------------------------  Assessment & Plan: I am concerned about the possibility of infection and abscess dorsum of the right foot.  Some the swelling is likely due to the fractures but infection potential is there.  I realize an MRI was done last week and was negative for osteo or abcess, but I am concerned she may have developed abcess with further breaksown of skin.  I have ordered another MRI today and scheduled her for I&D tomorrow am  Active Problems:   Foot abscess     Family Communication: Plan discussed with patient    Albertine Patricia M.D on 04/07/2017 at 8:36 AM  Thank you for the consult, we will follow the patient with you in the Hospital.

## 2017-04-08 ENCOUNTER — Inpatient Hospital Stay: Payer: PPO | Admitting: Anesthesiology

## 2017-04-08 ENCOUNTER — Encounter
Admission: EM | Disposition: A | Discharge status: Skilled Nursing Facility | Payer: Self-pay | Source: Home / Self Care | Attending: Family Medicine

## 2017-04-08 DIAGNOSIS — L02611 Cutaneous abscess of right foot: Secondary | ICD-10-CM

## 2017-04-08 DIAGNOSIS — F0390 Unspecified dementia without behavioral disturbance: Secondary | ICD-10-CM

## 2017-04-08 HISTORY — PX: IRRIGATION AND DEBRIDEMENT ABSCESS: SHX5252

## 2017-04-08 SURGERY — IRRIGATION AND DEBRIDEMENT ABSCESS
Anesthesia: General | Laterality: Right | Wound class: Dirty or Infected

## 2017-04-08 MED ORDER — FENTANYL CITRATE (PF) 100 MCG/2ML IJ SOLN
INTRAMUSCULAR | Status: AC
  Filled 2017-04-08: qty 2

## 2017-04-08 MED ORDER — MIDAZOLAM HCL 2 MG/2ML IJ SOLN
INTRAMUSCULAR | Status: DC | PRN
  Administered 2017-04-08: 1 mg via INTRAVENOUS

## 2017-04-08 MED ORDER — SODIUM CHLORIDE 0.9 % IV SOLN
INTRAVENOUS | Status: DC | PRN
  Administered 2017-04-08: 20 mL

## 2017-04-08 MED ORDER — PROPOFOL 10 MG/ML IV BOLUS
INTRAVENOUS | Status: AC
  Filled 2017-04-08: qty 20

## 2017-04-08 MED ORDER — LIDOCAINE HCL (PF) 1 % IJ SOLN
INTRAMUSCULAR | Status: AC
  Filled 2017-04-08: qty 30

## 2017-04-08 MED ORDER — BUPIVACAINE LIPOSOME 1.3 % IJ SUSP
INTRAMUSCULAR | Status: AC
  Filled 2017-04-08: qty 20

## 2017-04-08 MED ORDER — BUPIVACAINE HCL (PF) 0.5 % IJ SOLN
INTRAMUSCULAR | Status: AC
  Filled 2017-04-08: qty 30

## 2017-04-08 MED ORDER — BUPIVACAINE HCL (PF) 0.5 % IJ SOLN
INTRAMUSCULAR | Status: DC | PRN
  Administered 2017-04-08: 9 mL

## 2017-04-08 MED ORDER — FENTANYL CITRATE (PF) 100 MCG/2ML IJ SOLN: 25.0000 ug | INTRAMUSCULAR | Status: DC | PRN

## 2017-04-08 MED ORDER — ONDANSETRON HCL 4 MG/2ML IJ SOLN
INTRAMUSCULAR | Status: AC
  Filled 2017-04-08: qty 2

## 2017-04-08 MED ORDER — LIDOCAINE HCL (PF) 2 % IJ SOLN
INTRAMUSCULAR | Status: AC
  Filled 2017-04-08: qty 10

## 2017-04-08 MED ORDER — MIDAZOLAM HCL 2 MG/2ML IJ SOLN
INTRAMUSCULAR | Status: AC
  Filled 2017-04-08: qty 2

## 2017-04-08 MED ORDER — PROPOFOL 10 MG/ML IV BOLUS
INTRAVENOUS | Status: DC | PRN
  Administered 2017-04-08: 100 mg via INTRAVENOUS

## 2017-04-08 MED ORDER — PROMETHAZINE HCL 25 MG/ML IJ SOLN: 6.2500 mg | INTRAMUSCULAR | Status: DC | PRN

## 2017-04-08 MED ORDER — ONDANSETRON HCL 4 MG/2ML IJ SOLN
INTRAMUSCULAR | Status: DC | PRN
  Administered 2017-04-08: 4 mg via INTRAVENOUS

## 2017-04-08 MED ORDER — LIDOCAINE HCL (CARDIAC) 20 MG/ML IV SOLN
INTRAVENOUS | Status: DC | PRN
  Administered 2017-04-08: 100 mg via INTRAVENOUS

## 2017-04-08 MED ORDER — FENTANYL CITRATE (PF) 100 MCG/2ML IJ SOLN
INTRAMUSCULAR | Status: DC | PRN
  Administered 2017-04-08: 25 ug via INTRAVENOUS

## 2017-04-08 MED ORDER — SODIUM CHLORIDE 0.9 % IJ SOLN
INTRAMUSCULAR | Status: AC
  Filled 2017-04-08: qty 50

## 2017-04-08 MED ORDER — MEPERIDINE HCL 50 MG/ML IJ SOLN: 6.2500 mg | INTRAMUSCULAR | Status: DC | PRN

## 2017-04-08 MED ORDER — HYDRALAZINE HCL 20 MG/ML IJ SOLN
10.0000 mg | INTRAMUSCULAR | Status: DC | PRN
  Administered 2017-04-10: 10 mg via INTRAVENOUS
  Filled 2017-04-08: qty 1

## 2017-04-08 MED ORDER — LACTATED RINGERS IV SOLN
INTRAVENOUS | Status: DC | PRN
  Administered 2017-04-08: 09:00:00 via INTRAVENOUS

## 2017-04-08 SURGICAL SUPPLY — 40 items
BAG COUNTER SPONGE EZ (MISCELLANEOUS) ×2 IMPLANT
BANDAGE ELASTIC 3 LF NS (GAUZE/BANDAGES/DRESSINGS) ×3 IMPLANT
BANDAGE ELASTIC 4 LF NS (GAUZE/BANDAGES/DRESSINGS) ×3 IMPLANT
BANDAGE STRETCH 3X4.1 STRL (GAUZE/BANDAGES/DRESSINGS) ×3 IMPLANT
BNDG ESMARK 4X12 TAN STRL LF (GAUZE/BANDAGES/DRESSINGS) ×3 IMPLANT
BNDG GAUZE 4.5X4.1 6PLY STRL (MISCELLANEOUS) ×6 IMPLANT
CANISTER SUCT 1200ML W/VALVE (MISCELLANEOUS) ×3 IMPLANT
COUNTER SPONGE BAG EZ (MISCELLANEOUS) ×1
CUFF TOURN 18 STER (MISCELLANEOUS) ×3 IMPLANT
CUFF TOURN DUAL PL 12 NO SLV (MISCELLANEOUS) ×3 IMPLANT
DRAPE FLUOR MINI C-ARM 54X84 (DRAPES) ×3 IMPLANT
DURAPREP 26ML APPLICATOR (WOUND CARE) ×3 IMPLANT
ELECT REM PT RETURN 9FT ADLT (ELECTROSURGICAL) ×3
ELECTRODE REM PT RTRN 9FT ADLT (ELECTROSURGICAL) ×1 IMPLANT
GAUZE PETRO XEROFOAM 1X8 (MISCELLANEOUS) IMPLANT
GAUZE SPONGE 4X4 12PLY STRL (GAUZE/BANDAGES/DRESSINGS) ×3 IMPLANT
GLOVE BIO SURGEON STRL SZ8 (GLOVE) ×3 IMPLANT
GLOVE INDICATOR 8.0 STRL GRN (GLOVE) ×3 IMPLANT
GOWN STRL REUS W/ TWL LRG LVL3 (GOWN DISPOSABLE) ×1 IMPLANT
GOWN STRL REUS W/TWL LRG LVL3 (GOWN DISPOSABLE) ×2
KIT DRSG VAC SLVR GRANUFM (MISCELLANEOUS) ×3 IMPLANT
KIT TURNOVER KIT A (KITS) ×3 IMPLANT
LABEL OR SOLS (LABEL) ×3 IMPLANT
NEEDLE FILTER BLUNT 18X 1/2SAF (NEEDLE) ×2
NEEDLE FILTER BLUNT 18X1 1/2 (NEEDLE) ×1 IMPLANT
NEEDLE HYPO 25X1 1.5 SAFETY (NEEDLE) ×6 IMPLANT
NS IRRIG 500ML POUR BTL (IV SOLUTION) ×3 IMPLANT
PACK EXTREMITY ARMC (MISCELLANEOUS) ×3 IMPLANT
STOCKINETTE STRL 6IN 960660 (GAUZE/BANDAGES/DRESSINGS) ×3 IMPLANT
SUT ETHILON 3-0 FS-10 30 BLK (SUTURE) ×3
SUT ETHILON 4-0 (SUTURE) ×2
SUT ETHILON 4-0 FS2 18XMFL BLK (SUTURE) ×1
SUT VIC AB 3-0 SH 27 (SUTURE) ×2
SUT VIC AB 3-0 SH 27X BRD (SUTURE) ×1 IMPLANT
SUT VIC AB 4-0 FS2 27 (SUTURE) ×3 IMPLANT
SUTURE EHLN 3-0 FS-10 30 BLK (SUTURE) ×1 IMPLANT
SUTURE ETHLN 4-0 FS2 18XMF BLK (SUTURE) ×1 IMPLANT
SYR 10ML LL (SYRINGE) ×3 IMPLANT
SYR 3ML LL SCALE MARK (SYRINGE) ×6 IMPLANT
WND VAC CANISTER 500ML (MISCELLANEOUS) ×3 IMPLANT

## 2017-04-08 NOTE — Transfer of Care (Signed)
Immediate Anesthesia Transfer of Care Note  Patient: [name] [name]  Procedure(s) Performed: IRRIGATION AND DEBRIDEMENT ABSCESS with wound vac (Right )  Patient Location: PACU  Anesthesia Type:General  Level of Consciousness: awake  Airway & Oxygen Therapy: Patient Spontanous Breathing and Patient connected to face mask oxygen  Post-op Assessment: Report given to RN and Post -op Vital signs reviewed and stable  Post vital signs: Reviewed and stable  Last Vitals:  Vitals:   04/08/17 0727 04/08/17 0937  BP: (!) 163/51 (!) (P) 164/55  Pulse: 67 (P) 65  Resp: 16 (P) 20  Temp: 37.3 C (P) 36.8 C  SpO2: 93% (P) 99%    Last Pain:  Vitals:   04/08/17 0727  TempSrc: Oral  PainSc: 0-No pain      Patients Stated Pain Goal: 3 (79/89/21 1941)  Complications: No apparent anesthesia complications

## 2017-04-08 NOTE — Progress Notes (Signed)
[place] [facility] [place] NAME: Romie Keeble    MR#:  [id number]  DATE OF BIRTH:  12-Dec-1930  SUBJECTIVE:  CHIEF COMPLAINT:   Chief Complaint  Patient presents with  . Cellulitis  Patient without complaint, status post I&D this morning of the right foot abscess  REVIEW OF SYSTEMS:  CONSTITUTIONAL: No fever, fatigue or weakness.  EYES: No blurred or double vision.  EARS, NOSE, AND THROAT: No tinnitus or ear pain.  RESPIRATORY: No cough, shortness of breath, wheezing or hemoptysis.  CARDIOVASCULAR: No chest pain, orthopnea, edema.  GASTROINTESTINAL: No nausea, vomiting, diarrhea or abdominal pain.  GENITOURINARY: No dysuria, hematuria.  ENDOCRINE: No polyuria, nocturia,  HEMATOLOGY: No anemia, easy bruising or bleeding SKIN: No rash or lesion. MUSCULOSKELETAL: No joint pain or arthritis.   NEUROLOGIC: No tingling, numbness, weakness.  PSYCHIATRY: No anxiety or depression.   ROS  DRUG ALLERGIES:   Allergies  Allergen Reactions  . Atorvastatin Other (See Comments)    Myositis  . Bisphosphonates Other (See Comments)    GI upset  . Codeine Other (See Comments)  . Memantine Other (See Comments)  . Omeprazole Nausea Only  . Other Other (See Comments)    Leg cramps GI upset  . Salsalate Other (See Comments)  . Sulfa Antibiotics     Pt doesn't remember  . Penicillins Itching    Has patient had a PCN reaction causing immediate rash, facial/tongue/throat swelling, SOB or lightheadedness with hypotension: Yes Has patient had a PCN reaction causing severe rash involving mucus membranes or skin necrosis: No Has patient had a PCN reaction that required hospitalization: No Has patient had a PCN reaction occurring within the last 10 years: No If all of the above answers are "NO", then may proceed with Cephalosporin use.    VITALS:  Blood pressure (!) 179/61, pulse 77, temperature 99.1 F (37.3 C), temperature source Oral, resp. rate 18,  height 5\' 3"  (1.6 m), weight 65.8 kg (145 lb), SpO2 100 %.  PHYSICAL EXAMINATION:  GENERAL:  84 y.o.-year-old patient lying in the bed with no acute distress.  EYES: Pupils equal, round, reactive to light and accommodation. No scleral icterus. Extraocular muscles intact.  HEENT: Head atraumatic, normocephalic. Oropharynx and nasopharynx clear.  NECK:  Supple, no jugular venous distention. No thyroid enlargement, no tenderness.  LUNGS: Normal breath sounds bilaterally, no wheezing, rales,rhonchi or crepitation. No use of accessory muscles of respiration.  CARDIOVASCULAR: S1, S2 normal. No murmurs, rubs, or gallops.  ABDOMEN: Soft, nontender, nondistended. Bowel sounds present. No organomegaly or mass.  EXTREMITIES: No pedal edema, cyanosis, or clubbing.  NEUROLOGIC: Cranial nerves II through XII are intact. Muscle strength 5/5 in all extremities. Sensation intact. Gait not checked.  PSYCHIATRIC: The patient is alert and oriented x 3.  SKIN: No obvious rash, lesion, or ulcer.   Physical Exam LABORATORY PANEL:   CBC Recent Labs  Lab 04/07/17 0400  WBC 15.4*  HGB 10.9*  HCT 33.7*  PLT 349   ------------------------------------------------------------------------------------------------------------------  Chemistries  Recent Labs  Lab 04/06/17 0929 04/07/17 0400  NA 141 138  K 4.7 4.3  CL 108 104  CO2 23 22  GLUCOSE 96 119*  BUN 34* 26*  CREATININE 1.19* 1.07*  CALCIUM 9.2 8.5*  AST 20  --   ALT 14  --   ALKPHOS 66  --   BILITOT 0.8  --    ------------------------------------------------------------------------------------------------------------------  Cardiac Enzymes No results for input(s): TROPONINI in the last 168 hours. ------------------------------------------------------------------------------------------------------------------  RADIOLOGY:  Mr Foot Right W Wo Contrast  Result Date: 04/07/2017 CLINICAL DATA:  Right foot cellulitis. Evaluate for abscess or  osteomyelitis. EXAM: MRI OF THE RIGHT FOREFOOT WITHOUT AND WITH CONTRAST TECHNIQUE: Multiplanar, multisequence MR imaging of the right forefoot was performed before and after the administration of intravenous contrast. CONTRAST:  15mL MULTIHANCE GADOBENATE DIMEGLUMINE 529 MG/ML IV SOLN COMPARISON:  Right foot x-rays dated April 06, 2017. Right foot MRI dated March 30, 2017. FINDINGS: Bones/Joint/Cartilage Unchanged nondisplaced fracture at the lateral base of the first proximal phalanx. Unchanged nondisplaced fracture of the distal shaft of the first metatarsal, second metatarsal neck and third metatarsal head. Unchanged nondisplaced fracture of the anterior calcaneus adjacent to the calcaneocuboid joint. No periosteal reaction or cortical destruction. Normal alignment. No joint effusion. Ligaments Collateral ligaments are intact.  Lisfranc ligament is intact. Muscles and Tendons Flexor, peroneal and extensor compartment tendons are intact. Mild increased STIR signal within the intrinsic muscles of the forefoot, likely related to diabetic muscle changes. Soft tissue New ill-defined, rim enhancing fluid collection over the dorsum of the lateral foot, extending from the MTP joints to the midfoot. The fluid collection measures approximately 8.6 x 2.3 x 1.6 cm (AP by transverse by CC). This communicates with the overlying skin surface. No soft tissue mass. IMPRESSION: 1. Ill-defined, rim enhancing 8.6 cm fluid collection over the dorsum of the lateral foot, extending from the MTP joints to the midfoot, consistent with abscess. This appears to drain to the overlying skin surface. 2. No evidence of osteomyelitis. 3. Unchanged nondisplaced fractures of the first proximal phalanx, distal first metatarsal shaft, second metatarsal neck, third metatarsal head, and distal anterior calcaneus near the calcaneocuboid joint. Electronically Signed   By: Titus Dubin M.D.   On: 04/07/2017 10:47   Dg Foot Complete  Right  Result Date: 04/06/2017 Please see detailed radiograph report in office note.   ASSESSMENT AND PLAN:  1Acute deep-seated right foot infection/right foot abscess with surrounding cellulitis Very recent hospital discharge for same, failed outpatient doxycycline ID input appreciated, continue empiric IV vancomycin/Rocephin, s/p I&D on April 08, 2017 by podiatry-recovering well follow-up cultures MRI of the foot noted for abscess  2acute pain syndrome Controlled on current regiment Secondary to above as well assubacute rightfoot fractures  3chronic benign essential retention Stable on current regiment  4chronic poor appetite Stable ContinueMegace bid  Full code Condition stable Disposition back to inpatient rehab and 1-2 days barring any complications  All the records are reviewed and case discussed with Care Management/Social Workerr. Management plans discussed with the patient, family and they are in agreement.  CODE STATUS: full  TOTAL TIME TAKING CARE OF THIS PATIENT: 35 minutes.     POSSIBLE D/C IN 1-2 DAYS, DEPENDING ON CLINICAL CONDITION.   Avel Peace Padraic Marinos M.D on 04/08/2017   Between 7am to 6pm - Pager - [phone]  After 6pm go to [email or web address] - password [email or web address] [facility]  Office  [phone]  CC: Primary care physician; Idelle Crouch, MD  Note: This dictation was prepared with Dragon dictation along with smaller phrase technology. Any transcriptional errors that result from this process are unintentional.

## 2017-04-08 NOTE — Consult Note (Signed)
[facility] Vascular Consult Note  MRN : [id number]  [name] [name] is a 84 y.o. (Oct 10, 1930) female who presents with chief complaint of  Chief Complaint  Patient presents with  . Cellulitis  .  History of Present Illness: I am asked to see the patient by Dr. Jerelyn Charles regarding evaluation of her right foot perfusion.  The patient is seen in the recovery room immediately following drainage of an abscess by podiatry this morning.  She can provide no history and the history previous medical record as well as discussions with Dr. Elvina Mattes.  Apparently, the patient fell and broke metatarsals about a week ago and has developed a significant hematoma and subsequent abscess.  This was drained today.  She did not have a reported antecedent history of vascular disease or concern thereof.  Her bleeding was reasonably good at the time of abscess drainage today.  Current Facility-Administered Medications  Medication Dose Route Frequency Provider Last Rate Last Dose  . [MAR Hold] acetaminophen (TYLENOL) tablet 650 mg  650 mg Oral Q6H PRN Loney Hering D, MD   650 mg at 04/07/17 2218   Or  . [MAR Hold] acetaminophen (TYLENOL) suppository 650 mg  650 mg Rectal Q6H PRN Salary, Montell D, MD      . Doug Sou Hold] amLODipine (NORVASC) tablet 10 mg  10 mg Oral Daily Salary, Montell D, MD   10 mg at 04/07/17 1110  . [MAR Hold] aspirin EC tablet 81 mg  81 mg Oral Daily Salary, Montell D, MD   81 mg at 04/07/17 1108  . [MAR Hold] bisacodyl (DULCOLAX) suppository 10 mg  10 mg Rectal Daily PRN Salary, Avel Peace, MD      . Doug Sou Hold] cefTRIAXone (ROCEPHIN) 1 g in dextrose 5 % 50 mL IVPB  1 g Intravenous Q24H Gorden Harms, MD   Stopped at 04/07/17 1138  . [MAR Hold] docusate sodium (COLACE) capsule 100 mg  100 mg Oral BID Loney Hering D, MD   100 mg at 04/07/17 2218  . fentaNYL (SUBLIMAZE) injection 25-50 mcg  25-50 mcg Intravenous Q5 min PRN Penwarden, Amy, MD      . Doug Sou Hold] folic acid  (FOLVITE) tablet 1 mg  1 mg Oral Daily Salary, Montell D, MD   1 mg at 04/07/17 1109  . [MAR Hold] heparin injection 5,000 Units  5,000 Units Subcutaneous Q8H Salary, Montell D, MD   5,000 Units at 04/08/17 0610  . [MAR Hold] isosorbide mononitrate (IMDUR) 24 hr tablet 30 mg  30 mg Oral Daily Salary, Montell D, MD   30 mg at 04/07/17 1107  . [MAR Hold] losartan (COZAAR) tablet 100 mg  100 mg Oral Daily Salary, Montell D, MD   100 mg at 04/07/17 1110  . [MAR Hold] meloxicam (MOBIC) tablet 7.5 mg  7.5 mg Oral Daily Salary, Montell D, MD   7.5 mg at 04/07/17 1108  . meperidine (DEMEROL) injection 6.25-12.5 mg  6.25-12.5 mg Intravenous Q5 min PRN Penwarden, Amy, MD      . Doug Sou Hold] metoprolol tartrate (LOPRESSOR) tablet 100 mg  100 mg Oral BID Salary, Montell D, MD   100 mg at 04/07/17 2218  . [MAR Hold] mirtazapine (REMERON) tablet 7.5 mg  7.5 mg Oral QHS Salary, Montell D, MD   7.5 mg at 04/07/17 2218  . [MAR Hold] multivitamin with minerals tablet 1 tablet  1 tablet Oral Daily Salary, Montell D, MD   1 tablet at 04/07/17 1108  . [facility]  Hold] ondansetron (ZOFRAN) tablet 4 mg  4 mg Oral Q6H PRN Salary, Avel Peace, MD       Or  . Doug Sou Hold] ondansetron (ZOFRAN) injection 4 mg  4 mg Intravenous Q6H PRN Salary, Avel Peace, MD      . Doug Sou Hold] oxyCODONE (Oxy IR/ROXICODONE) immediate release tablet 5 mg  5 mg Oral Q4H PRN Salary, Avel Peace, MD      . Doug Sou Hold] polyethylene glycol (MIRALAX / GLYCOLAX) packet 17 g  17 g Oral Daily PRN Salary, Montell D, MD      . promethazine (PHENERGAN) injection 6.25-12.5 mg  6.25-12.5 mg Intravenous Q15 min PRN Penwarden, Amy, MD      . Doug Sou Hold] sertraline (ZOLOFT) tablet 100 mg  100 mg Oral Daily Salary, Montell D, MD   100 mg at 04/07/17 1109  . [MAR Hold] traMADol (ULTRAM) tablet 50 mg  50 mg Oral Q6H PRN Salary, Montell D, MD   50 mg at 04/07/17 0103  . [MAR Hold] vancomycin (VANCOCIN) IVPB 750 mg/150 ml premix  750 mg Intravenous Q24H Lifsey, Betti Cruz, RPH   Stopped  at 04/08/17 0400  . [MAR Hold] vitamin C (ASCORBIC ACID) tablet 1,000 mg  1,000 mg Oral Daily Salary, Montell D, MD   1,000 mg at 04/07/17 1112    Past Medical History:  Diagnosis Date  . breast cancer   . Fibrocystic breast disease    Severe; status post bilateral mastectomies and implants  . GERD (gastroesophageal reflux disease)    Severe; manifested vocal cord irritation, status-post Nissen fundcoplication in 05/979  . History of ataxia    with questionable right upper and lower  . History of breast cancer   . History of exertional chest pain    with her last stress echo in 09/1999 being normal  . History of migraine    vascular  . History of shingles   . History of syncope 12/1999   With negative cardiac workup to include an echocardiogram, which revealed only mild to moderate MR, a CT scan of the chest, which was negative, and a Cardiolite stress test, which was normal  . Hyperlipidemia, unspecified   . Hypertension    CTA 05/2004 showed no renovascular cause  . Mild dementia   . Muscle atrophy    Positive FANA and anti- SCL70 antibodies; S/p biopsy without eosinic fascitis; Remicade discontinued  . Osteoarthritis    a. Cervical spine. b. Lumbar spine/spinal stenosis. [facility])  . Osteoporosis    a. Fosamax b. Right wrist fracture. c. Sternal fracture ([place])  . Rheumatoid arthritis without elevated rheumatoid factor (HCC)    a. Methotrexate. b. S/p Remicade. c. Seronegative. d. S/p Plaquenil. [facility])    Past Surgical History:  Procedure Laterality Date  . APPENDECTOMY    . CHOLECYSTECTOMY    . LAPAROSCOPIC NISSEN FUNDOPLICATION  19/1478  . LUMBAR LAMINECTOMY     for spinal stenosis  . MASTECTOMY Bilateral    with implants  . ORIF DISTAL RADIUS FRACTURE Right 07/27/2008   with volar plate  . TOTAL ABDOMINAL HYSTERECTOMY W/ BILATERAL SALPINGOOPHORECTOMY    . VASCULAR SURGERY      Social History Social History   Tobacco Use  . Smoking status: Never Smoker  . Smokeless  tobacco: Never Used  Substance Use Topics  . Alcohol use: No  . Drug use: No    Family History Family History  Problem Relation Age of Onset  . Heart failure Mother   . Hypertension Mother   .  Cancer Father   . Heart attack Sister     Allergies  Allergen Reactions  . Atorvastatin Other (See Comments)    Myositis  . Bisphosphonates Other (See Comments)    GI upset  . Codeine Other (See Comments)  . Memantine Other (See Comments)  . Omeprazole Nausea Only  . Other Other (See Comments)    Leg cramps GI upset  . Salsalate Other (See Comments)  . Sulfa Antibiotics     Pt doesn't remember  . Penicillins Itching    Has patient had a PCN reaction causing immediate rash, facial/tongue/throat swelling, SOB or lightheadedness with hypotension: Yes Has patient had a PCN reaction causing severe rash involving mucus membranes or skin necrosis: No Has patient had a PCN reaction that required hospitalization: No Has patient had a PCN reaction occurring within the last 10 years: No If all of the above answers are "NO", then may proceed with Cephalosporin use.     REVIEW OF SYSTEMS (Negative unless checked) Patient unable to provide from sedation from recent surgery as well as profound dementia  Physical Examination  Vitals:   04/08/17 0937 04/08/17 0942 04/08/17 0952 04/08/17 1008  BP: (!) 164/55  (!) 158/50 (!) 146/53  Pulse: 65 62 60 64  Resp: 20 15 13  (!) 27  Temp: 98.3 F (36.8 C)   97.7 F (36.5 C)  TempSrc:      SpO2: 99% 100% 100% 93%  Weight:      Height:       Body mass index is 25.69 kg/m. Gen:  WD/WN, somnolent from her recent surgery.  Does appear younger than stated age Head: [place]/AT, No temporalis wasting.  Ear/Nose/Throat: Hearing seems diminished, nares w/o erythema or drainage, oropharynx w/o Erythema/Exudate Eyes: Sclera non-icteric, conjunctiva clear Neck: Trachea midline.  No JVD.  Pulmonary:  Good air movement, respirations not labored, equal  bilaterally.  Cardiac: RRR, no JVD Vascular:  Vessel Right Left  Radial Palpable Palpable                                    Musculoskeletal: Right foot with a VAC in a large bulky dressing making examination of the foot and perfusion difficult.  No edema. Neurologic: Difficult to assess with her sedation from her recent procedure. Psychiatric: Difficult to assess with her sedation from recent procedure Dermatologic: Right foot with a large bulky dressing currently      CBC Lab Results  Component Value Date   WBC 15.4 (H) 04/07/2017   HGB 10.9 (L) 04/07/2017   HCT 33.7 (L) 04/07/2017   MCV 117.5 (H) 04/07/2017   PLT 349 04/07/2017    BMET    Component Value Date/Time   NA 138 04/07/2017 0400   NA 138 10/01/2013 1754   K 4.3 04/07/2017 0400   K 3.8 10/01/2013 1754   CL 104 04/07/2017 0400   CL 105 10/01/2013 1754   CO2 22 04/07/2017 0400   CO2 25 10/01/2013 1754   GLUCOSE 119 (H) 04/07/2017 0400   GLUCOSE 95 10/01/2013 1754   BUN 26 (H) 04/07/2017 0400   BUN 13 10/01/2013 1754   CREATININE 1.07 (H) 04/07/2017 0400   CREATININE 0.91 10/01/2013 1754   CALCIUM 8.5 (L) 04/07/2017 0400   CALCIUM 8.6 10/01/2013 1754   GFRNONAA 46 (L) 04/07/2017 0400   GFRNONAA 59 (L) 10/01/2013 1754   GFRAA 54 (L) 04/07/2017 0400   GFRAA >60 10/01/2013  1754   Estimated Creatinine Clearance: 35.7 mL/min (A) (by C-G formula based on SCr of 1.07 mg/dL (H)).  COAG Lab Results  Component Value Date   INR 1.0 12/09/2007    Radiology Dg Chest 2 View  Result Date: 03/19/2017 CLINICAL DATA:  Elevated white cell count. History of breast cancer. EXAM: CHEST  2 VIEW COMPARISON:  Chest and CT chest 06/23/2016 FINDINGS: Heart size and pulmonary vascularity are normal probable emphysematous changes in the lungs. Peribronchial thickening consistent with chronic bronchitis. No airspace disease or consolidation. No blunting of costophrenic angles. No pneumothorax. Mediastinal contours  appear intact. Calcification of the aorta. Degenerative changes in the spine. IMPRESSION: Mild emphysematous and chronic bronchitic changes in the lungs. No evidence of active pulmonary disease. Aortic atherosclerosis. Electronically Signed   By: Lucienne Capers M.D.   On: 03/19/2017 00:59   Dg Shoulder Right  Result Date: 03/18/2017 CLINICAL DATA:  [place] off of bus EXAM: RIGHT SHOULDER - 2+ VIEW COMPARISON:  None. FINDINGS: No fracture or malalignment. AC joint degenerative change. Mild glenohumeral degenerative change. Right lung apex is clear IMPRESSION: No acute osseous abnormality Electronically Signed   By: Donavan Foil M.D.   On: 03/18/2017 19:47   Dg Wrist Complete Right  Result Date: 03/18/2017 CLINICAL DATA:  [place] off bus, wrist deformity EXAM: RIGHT WRIST - COMPLETE 3+ VIEW COMPARISON:  None. FINDINGS: No acute fracture or malalignment. Patient is status post prior surgical plate and multiple screw fixation of the distal radius for old fracture. Marked arthritis at the first [facility] joint and at the distal scaphoid. No radiopaque foreign body. IMPRESSION: Postsurgical changes of the distal radius. No definite acute osseous abnormality Electronically Signed   By: Donavan Foil M.D.   On: 03/18/2017 19:40   Dg Ankle 2 Views Right  Result Date: 03/18/2017 CLINICAL DATA:  [place] off of bus EXAM: RIGHT ANKLE - 2 VIEW COMPARISON:  None. FINDINGS: No fracture or malalignment. Dorsal spurs off the navicular and talus. Ankle mortise is symmetric. Gas in the dorsal soft tissues of the mid to distal foot IMPRESSION: 1. No acute osseous abnormality 2. Gas in the dorsal soft tissues of the mid to distal foot Electronically Signed   By: Donavan Foil M.D.   On: 03/18/2017 19:46   Ct Head Wo Contrast  Result Date: 03/18/2017 CLINICAL DATA:  Follow-up off of bus.  Neck pain. EXAM: CT HEAD WITHOUT CONTRAST CT CERVICAL SPINE WITHOUT CONTRAST TECHNIQUE: Multidetector CT imaging of the head and cervical spine was  performed following the standard protocol without intravenous contrast. Multiplanar CT image reconstructions of the cervical spine were also generated. COMPARISON:  Head CT 11/26/2013 FINDINGS: CT HEAD FINDINGS Brain: No intracranial hemorrhage. No parenchymal contusion. No midline shift or mass effect. Basilar cisterns are patent. No skull base fracture. No fluid in the paranasal sinuses or mastoid air cells. Orbits are normal. There are periventricular and subcortical white matter hypodensities. Generalized cortical atrophy. Vascular: No hyperdense vessel or unexpected calcification. Skull: No skull fracture. Small scalp hematoma over the RIGHT frontal bone. The Sinuses/Orbits: No acute finding. Other: None CT CERVICAL SPINE FINDINGS Alignment: Normal alignment of the cervical vertebral bodies. Skull base and vertebrae: Normal craniocervical junction. No loss of vertebral body height or disc height. Normal facet articulation. No evidence of fracture. Soft tissues and spinal canal: No prevertebral soft tissue swelling. No perispinal or epidural hematoma. Disc levels:  Endplate spurring and disc space narrowing from C5-C7. Upper chest: Clear Other: None IMPRESSION: 1. No intracranial  trauma. 2. Small RIGHT frontal scalp hematoma without fracture. 3. Chronic atrophy and white matter microvascular disease. 4. No cervical spine fracture. 5. Mild multilevel disc osteophytic disease. Electronically Signed   By: Suzy Bouchard M.D.   On: 03/18/2017 19:26   Ct Cervical Spine Wo Contrast  Result Date: 03/18/2017 CLINICAL DATA:  Follow-up off of bus.  Neck pain. EXAM: CT HEAD WITHOUT CONTRAST CT CERVICAL SPINE WITHOUT CONTRAST TECHNIQUE: Multidetector CT imaging of the head and cervical spine was performed following the standard protocol without intravenous contrast. Multiplanar CT image reconstructions of the cervical spine were also generated. COMPARISON:  Head CT 11/26/2013 FINDINGS: CT HEAD FINDINGS Brain: No  intracranial hemorrhage. No parenchymal contusion. No midline shift or mass effect. Basilar cisterns are patent. No skull base fracture. No fluid in the paranasal sinuses or mastoid air cells. Orbits are normal. There are periventricular and subcortical white matter hypodensities. Generalized cortical atrophy. Vascular: No hyperdense vessel or unexpected calcification. Skull: No skull fracture. Small scalp hematoma over the RIGHT frontal bone. The Sinuses/Orbits: No acute finding. Other: None CT CERVICAL SPINE FINDINGS Alignment: Normal alignment of the cervical vertebral bodies. Skull base and vertebrae: Normal craniocervical junction. No loss of vertebral body height or disc height. Normal facet articulation. No evidence of fracture. Soft tissues and spinal canal: No prevertebral soft tissue swelling. No perispinal or epidural hematoma. Disc levels:  Endplate spurring and disc space narrowing from C5-C7. Upper chest: Clear Other: None IMPRESSION: 1. No intracranial trauma. 2. Small RIGHT frontal scalp hematoma without fracture. 3. Chronic atrophy and white matter microvascular disease. 4. No cervical spine fracture. 5. Mild multilevel disc osteophytic disease. Electronically Signed   By: Suzy Bouchard M.D.   On: 03/18/2017 19:26   Dg Hand 2 View Right  Result Date: 03/18/2017 CLINICAL DATA:  [place] off bus with wrist deformity EXAM: RIGHT HAND - 2 VIEW COMPARISON:  None. FINDINGS: No fracture is seen. Radial deviation at the second DIP joint. Arthritis second through fifth DIP joints with small suspected erosions at the heads of the second third and fourth middle phalanges. Mild arthritis at the second third and fourth PIP joints. Moderate arthritis at the base of the first [facility] joint and radial aspect of the wrist. IMPRESSION: 1. No acute fracture is seen 2. Arthritis at the DIP and PIP joints as well as the radial aspect of the wrist; small erosive changes are suspected, query inflammatory arthritis  Electronically Signed   By: Donavan Foil M.D.   On: 03/18/2017 19:42   Mr Foot Right Wo Contrast  Result Date: 03/30/2017 CLINICAL DATA:  Cellulitis of the toe.  Pain. EXAM: MRI OF THE RIGHT FOREFOOT WITHOUT CONTRAST TECHNIQUE: Multiplanar, multisequence MR imaging of the right forefoot was performed. No intravenous contrast was administered. COMPARISON:  None. FINDINGS: Bones/Joint/Cartilage Nondisplaced fracture at the lateral base of the first proximal phalanx with mild surrounding marrow edema. Nondisplaced oblique fracture of the distal shaft of the first metatarsal with surrounding marrow edema. Nondisplaced fracture of the second metatarsal neck with marrow edema in the second metatarsal shaft and second metatarsal head. Nondisplaced fracture of third metatarsal head. Severe marrow edema with a linear component involving the distal anterior calcaneus adjacent to the calcaneocuboid joint partially visualized most consistent with a nondisplaced fracture. No periosteal reaction or bone destruction. Normal alignment. No joint effusion. Ligaments Collateral ligaments are intact.  Lisfranc ligament is intact Muscles and Tendons Flexor, peroneal and extensor compartment tendons are intact. Muscles are normal. Soft tissue No fluid  collection or hematoma. No soft tissue mass. Soft tissue edema along the dorsal aspect of the foot which may be reactive secondary to venous insufficiency versus cellulitis. IMPRESSION: 1. Nondisplaced fracture at the lateral base of the first proximal phalanx with mild surrounding marrow edema. 2. Nondisplaced oblique fracture of the distal shaft of the first metatarsal with surrounding marrow edema. 3. Nondisplaced fracture of the second metatarsal neck with marrow edema in the second metatarsal shaft and second metatarsal head. 4. Nondisplaced fracture of third metatarsal head. 5. Nondisplaced fracture of the distal anterior calcaneus at the calcaneocuboid joint. 6. No evidence of  osteomyelitis. Electronically Signed   By: Kathreen Devoid   On: 03/30/2017 16:29   Mr Foot Right W Wo Contrast  Result Date: 04/07/2017 CLINICAL DATA:  Right foot cellulitis. Evaluate for abscess or osteomyelitis. EXAM: MRI OF THE RIGHT FOREFOOT WITHOUT AND WITH CONTRAST TECHNIQUE: Multiplanar, multisequence MR imaging of the right forefoot was performed before and after the administration of intravenous contrast. CONTRAST:  21mL MULTIHANCE GADOBENATE DIMEGLUMINE 529 MG/ML IV SOLN COMPARISON:  Right foot x-rays dated April 06, 2017. Right foot MRI dated March 30, 2017. FINDINGS: Bones/Joint/Cartilage Unchanged nondisplaced fracture at the lateral base of the first proximal phalanx. Unchanged nondisplaced fracture of the distal shaft of the first metatarsal, second metatarsal neck and third metatarsal head. Unchanged nondisplaced fracture of the anterior calcaneus adjacent to the calcaneocuboid joint. No periosteal reaction or cortical destruction. Normal alignment. No joint effusion. Ligaments Collateral ligaments are intact.  Lisfranc ligament is intact. Muscles and Tendons Flexor, peroneal and extensor compartment tendons are intact. Mild increased STIR signal within the intrinsic muscles of the forefoot, likely related to diabetic muscle changes. Soft tissue New ill-defined, rim enhancing fluid collection over the dorsum of the lateral foot, extending from the MTP joints to the midfoot. The fluid collection measures approximately 8.6 x 2.3 x 1.6 cm (AP by transverse by CC). This communicates with the overlying skin surface. No soft tissue mass. IMPRESSION: 1. Ill-defined, rim enhancing 8.6 cm fluid collection over the dorsum of the lateral foot, extending from the MTP joints to the midfoot, consistent with abscess. This appears to drain to the overlying skin surface. 2. No evidence of osteomyelitis. 3. Unchanged nondisplaced fractures of the first proximal phalanx, distal first metatarsal shaft, second  metatarsal neck, third metatarsal head, and distal anterior calcaneus near the calcaneocuboid joint. Electronically Signed   By: Titus Dubin M.D.   On: 04/07/2017 10:47   Dg Foot 2 Views Right  Result Date: 03/26/2017 CLINICAL DATA:  History of a fall 8 days ago resulting in lacerations over the dorsum of the foot requiring stitches. Patient also had an acute fracture of the base of the proximal phalanx of the great toe. The patient now is complaining of increased foot pain, erythema, and swelling. Clinical suspicion of cellulitis. EXAM: RIGHT FOOT - 2 VIEW COMPARISON:  Right foot series of March 18, 2017 FINDINGS: The displaced fracture through the lateral aspect of the base of the fifth metatarsal is again demonstrated and appears stable. The second metatarsal neck fracture is not well demonstrated. The other phalanges and metatarsals are intact. The tarsal bones exhibit no acute abnormalities. There is mild soft tissue swelling over the forefoot and midfoot. No soft tissue gas collections or foreign bodies are observed. IMPRESSION: Mild soft tissue swelling may reflect cellulitis. No plain film evidence of osteomyelitis. There are post traumatic changes of the base of the proximal phalanx of the great toe and possibly  of the neck of the second metatarsal as described. Electronically Signed   By: David  Martinique M.D.   On: 03/26/2017 12:37   Dg Foot 2 Views Right  Result Date: 03/18/2017 CLINICAL DATA:  [place] off bus with bruising EXAM: RIGHT FOOT - 2 VIEW COMPARISON:  None. FINDINGS: Acute, mildly displaced intra-articular fracture at the base of the first proximal phalanx. Suspected nondisplaced fracture at the neck of the second metatarsal. No subluxation. Moderate gas within the dorsal soft tissues with soft tissue swelling present. IMPRESSION: 1. Acute displaced intra-articular fracture at the base of the first proximal phalanx with suspected nondisplaced fracture at the neck of the second  metatarsal 2. Dorsal soft tissue swelling with moderate soft tissue gas; correlate with physical exam for open wound or laceration Electronically Signed   By: Donavan Foil M.D.   On: 03/18/2017 19:44   Dg Foot Complete Right  Result Date: 04/06/2017 Please see detailed radiograph report in office note.     Assessment/Plan 1.  Right foot abscess.  This was likely not the result of malperfusion but more of an injury and trauma with subsequent hematoma.  Her perfusion appeared to be reasonable at the time of drainage, so we will hold off on angiogram at this time but if the wound does not heal or there is subsequent evidence of malperfusion this could be performed at a later date. 2.  Dementia.  Fairly significant.  May make evaluation somewhat difficult.    Leotis Pain, MD  04/08/2017 10:22 AM    This note was created with Dragon medical transcription system.  Any error is purely unintentional

## 2017-04-08 NOTE — Anesthesia Postprocedure Evaluation (Signed)
Anesthesia Post Note  Patient: [name] [name]  Procedure(s) Performed: IRRIGATION AND DEBRIDEMENT ABSCESS with wound vac (Right )  Patient location during evaluation: PACU Anesthesia Type: General Level of consciousness: awake and alert Pain management: pain level controlled Vital Signs Assessment: post-procedure vital signs reviewed and stable Respiratory status: spontaneous breathing, nonlabored ventilation and respiratory function stable Cardiovascular status: blood pressure returned to baseline and stable Postop Assessment: no signs of nausea or vomiting Anesthetic complications: no     Last Vitals:  Vitals:   04/08/17 1022 04/08/17 1039  BP: (!) 156/53   Pulse: 61 68  Resp: 17   Temp:    SpO2: 95% 91%    Last Pain:  Vitals:   04/08/17 1039  TempSrc: Axillary  PainSc:                  Rachal Dvorsky

## 2017-04-08 NOTE — Progress Notes (Signed)
Family at bedside, Dr. Elvina Mattes spoke with patients Sister, consent obtained.  Patient refused to allow Dr. Elvina Mattes to mark her right foot.

## 2017-04-08 NOTE — Op Note (Signed)
Operative note   Surgeon: Dr. Albertine Patricia, DPM.    Assistant: None    Preop diagnosis: Abscess dorsum right foot    Postop diagnosis: Same with likely hematoma presence as well as infection.    Procedure:   1.  Incision and drainage of abscess and hematoma from the right dorsal foot 2.  Application of wound VAC to the dorsum of the right foot       EBL: 25 cc    Anesthesia:general I the anesthesia team.  I injected 10 cc 0.5% Marcaine plain preoperatively at the posterior tibial nerve anterior tibial nerve and the sural nerve.  After the case I injected 20 cc of Exparel around the wound.    Hemostasis: Mid calf tourniquet 220 mils mercury pressure total of about 15 minutes    Specimen: Deep tissue culture and wound culture were taken from the dorsum of the left foot    Complications: None    Operative indications: Patient had worsening swelling and skin damage to the dorsum of the right foot with significant cellulitis and MRI yesterday showed likely abscess in the region.    Procedure:  Patient was brought into the OR and placed on the operating table in thesupine position. After anesthesia was obtained theright lower extremity was prepped and draped in usual sterile fashion.  Operative Report: At this time attention was directed to the dorsum of the right foot where a 5 cm dorsal linear skin incision was made through some damaged skin and necrotic skin dorsally.  Once the incision was made and was deepened with blunt dissection the area was compressed and some purulence was removed.  As the area was bluntly dissected and expressed some dried hemorrhage consistent with old hematoma was removed from the area as well.  There were approximately 15 cc of this material removed as well as any purulent drainage.  The area was inspected and probed in between the fourth fifth metatarsal with just a mild amount of purulence.  Is also tracking medially to a small wound at the base of the  second toe which was cleaned and opened up as well.  At this point versa jet was used to debride necrotic tissue this went down to the long extensor tendon to the fifth toe and the  long extensor tendon to the fourth toe.  Probing these areas and compression yielded no further purulence.  The wound was tracked proximally medial laterally and distally to check for any other sinus tracts and none were found.  There was some undermining of wound from an overall standpoint.  Also next some necrotic skin which was removed with sharp dissection.  The area was copiously irrigated and debrided with the wound VAC set at a level 2.  The tourniquet was raised again at this point and a wound VAC was placed across the area once I was able to dry the skin and reduce bleeding.  A silver granular foam pad was put in the wound and it was placed at 125 mmHg continuous.  This seemed to function well at this point.  Some extra padding including 4 x 4's and Kerlix placed across the foot as well.    Patient tolerated the procedure and anesthesia well.  Was transported from the OR to the PACU with all vital signs stable and vascular status intact. To be discharged per routine protocol.  Will follow up in approximately 1 week in the outpatient clinic.

## 2017-04-08 NOTE — Progress Notes (Signed)
No issues or concerns overnight. Pnt appears comfortable and resting. Bed low, locked, bed alarm and call bell in reach. Will continue to monitor and assess.

## 2017-04-08 NOTE — Progress Notes (Signed)
Spoke with Sherlene Shams.  States PIV is working well, pt for surgery today. OK to place PICC 04-09-17.  Still awaiting blood culture results and ID approval for PICC line.

## 2017-04-08 NOTE — H&P (Signed)
H and P has been reviewed and no changes are noted. No changes to pt medical status before surgery.  MRI friom yesterday showed likely abcess to right foot.

## 2017-04-08 NOTE — Anesthesia Procedure Notes (Signed)
Procedure Name: LMA Insertion Date/Time: 04/08/2017 8:40 AM Performed by: Emmie Niemann, MD Pre-anesthesia Checklist: Patient identified, Emergency Drugs available, Suction available, Patient being monitored and Timeout performed Patient Re-evaluated:Patient Re-evaluated prior to induction Oxygen Delivery Method: Circle system utilized Preoxygenation: Pre-oxygenation with 100% oxygen Induction Type: IV induction Ventilation: Mask ventilation without difficulty LMA: LMA inserted LMA Size: 3.5 Placement Confirmation: positive ETCO2 Tube secured with: Tape Dental Injury: Teeth and Oropharynx as per pre-operative assessment

## 2017-04-08 NOTE — Anesthesia Post-op Follow-up Note (Signed)
Anesthesia QCDR form completed.        

## 2017-04-09 ENCOUNTER — Encounter: Payer: Self-pay | Admitting: Podiatry

## 2017-04-09 ENCOUNTER — Encounter
Admission: EM | Disposition: A | Discharge status: Skilled Nursing Facility | Payer: Self-pay | Source: Home / Self Care | Attending: Family Medicine

## 2017-04-09 ENCOUNTER — Inpatient Hospital Stay: Payer: PPO

## 2017-04-09 LAB — CREATININE, SERUM
CREATININE: 1.33 mg/dL — AB (ref 0.44–1.00)
GFR calc Af Amer: 41 mL/min — ABNORMAL LOW (ref >60)
GFR calc non Af Amer: 36 mL/min — ABNORMAL LOW (ref >60)

## 2017-04-09 LAB — AEROBIC CULTURE  (SUPERFICIAL SPECIMEN): CULTURE: NO GROWTH

## 2017-04-09 LAB — AEROBIC CULTURE W GRAM STAIN (SUPERFICIAL SPECIMEN)

## 2017-04-09 LAB — VANCOMYCIN, TROUGH: VANCOMYCIN TR: 14 ug/mL — AB (ref 15–20)

## 2017-04-09 SURGERY — LOWER EXTREMITY ANGIOGRAPHY
Anesthesia: Moderate Sedation | Laterality: Right

## 2017-04-09 MED ORDER — VANCOMYCIN HCL IN DEXTROSE 1-5 GM/200ML-% IV SOLN: 1000.0000 mg | INTRAVENOUS | Status: DC

## 2017-04-09 MED ORDER — SODIUM CHLORIDE 0.9 % IV SOLN
1.0000 g | INTRAVENOUS | Status: DC
  Administered 2017-04-09 – 2017-04-10 (×2): 1 g via INTRAVENOUS
  Filled 2017-04-09 (×3): qty 10

## 2017-04-09 MED ORDER — VANCOMYCIN HCL IN DEXTROSE 1-5 GM/200ML-% IV SOLN
1000.0000 mg | INTRAVENOUS | Status: DC
  Filled 2017-04-09: qty 200

## 2017-04-09 MED ORDER — SODIUM CHLORIDE 0.9% FLUSH: 10.0000 mL | INTRAVENOUS | Status: DC | PRN

## 2017-04-09 NOTE — Progress Notes (Signed)
PICC line removed per chest xray finding.  PICC malpositioned (r upper arm).  PICC removed with no difficulty. Patient and RN informed 2nd attempt would be made by AM PICC RN.  Both patient and patient RN verbalized understanding.

## 2017-04-09 NOTE — Progress Notes (Signed)
[facility]  Daily Progress Note   Subjective  - 1 Day Post-Op  Doing well.  Some pain but not too bad  Objective Vitals:   04/09/17 0331 04/09/17 0800 04/09/17 1211 04/09/17 1525  BP: (!) 163/53 (!) 162/54 (!) 137/45 (!) 128/40  Pulse: 63 69 64 (!) 59  Resp: 16 17 18 18   Temp: 98.2 F (36.8 C) 99.1 F (37.3 C)  99.5 F (37.5 C)  TempSrc: Oral Oral  Oral  SpO2: 94% 97% 95% 93%  Weight:      Height:        Intake/Output Summary (Last 24 hours) at 04/09/2017 1543 Last data filed at 04/09/2017 1410 Gross per 24 hour  Intake 1330 ml  Output 750 ml  Net 580 ml    PULM  CTAB CV  RRR VASC  Good capillary refill, dressing in place limiting exam.  Laboratory CBC    Component Value Date/Time   WBC 15.4 (H) 04/07/2017 0400   HGB 10.9 (L) 04/07/2017 0400   HGB 14.5 10/01/2013 1754   HCT 33.7 (L) 04/07/2017 0400   HCT 44.8 10/01/2013 1754   PLT 349 04/07/2017 0400   PLT 210 10/01/2013 1754    BMET    Component Value Date/Time   NA 138 04/07/2017 0400   NA 138 10/01/2013 1754   K 4.3 04/07/2017 0400   K 3.8 10/01/2013 1754   CL 104 04/07/2017 0400   CL 105 10/01/2013 1754   CO2 22 04/07/2017 0400   CO2 25 10/01/2013 1754   GLUCOSE 119 (H) 04/07/2017 0400   GLUCOSE 95 10/01/2013 1754   BUN 26 (H) 04/07/2017 0400   BUN 13 10/01/2013 1754   CREATININE 1.33 (H) 04/09/2017 0108   CREATININE 0.91 10/01/2013 1754   CALCIUM 8.5 (L) 04/07/2017 0400   CALCIUM 8.6 10/01/2013 1754   GFRNONAA 36 (L) 04/09/2017 0108   GFRNONAA 59 (L) 10/01/2013 1754   GFRAA 41 (L) 04/09/2017 0108   GFRAA >60 10/01/2013 1754    Assessment/Planning:    Infected right foot hematoma  No signs of malperfusion  CKD present and angiogram of low yield at this point so would not recommend  Will sign off, please call with questions    Leotis Pain  04/09/2017, 3:43 PM

## 2017-04-09 NOTE — Progress Notes (Signed)
Pharmacy Antibiotic Note  [name] [name] is a 84 y.o. female admitted on 04/06/2017 with deep-seated foot infection and suspected abscess.  Pharmacy has been consulted for Vancomycin and Ceftriaxone dosing.  Patient received vancomycin 1000mg  once and ceftriaxone 1g once in ED.   Plan: Will start Vancomycin 750mg  Q24H. Goal trought of 15-20.  Will start Ceftriaxone 1g Q24H.   Pharmacy will continue to monitor and adjust as needed.   Ke=0.031 T1/2= 22 hr Vd=40 Cmin= 17 Stacked dose @ 13 hr  Height: 5\' 3"  (160 cm) Weight: 145 lb (65.8 kg) IBW/kg (Calculated) : 52.4  Temp (24hrs), Avg:98.7 F (37.1 C), Min:97.7 F (36.5 C), Max:99.5 F (37.5 C)  Recent Labs  Lab 04/06/17 0929 04/06/17 1054 04/06/17 1259 04/07/17 0400 04/09/17 0108  WBC 16.4*  --   --  15.4*  --   CREATININE 1.19*  --   --  1.07* 1.33*  LATICACIDVEN  --  0.8 0.9  --   --   VANCOTROUGH  --   --   --   --  14*    Estimated Creatinine Clearance: 28.7 mL/min (A) (by C-G formula based on SCr of 1.33 mg/dL (H)).    Allergies  Allergen Reactions  . Atorvastatin Other (See Comments)    Myositis  . Bisphosphonates Other (See Comments)    GI upset  . Codeine Other (See Comments)  . Memantine Other (See Comments)  . Omeprazole Nausea Only  . Other Other (See Comments)    Leg cramps GI upset  . Salsalate Other (See Comments)  . Sulfa Antibiotics     Pt doesn't remember  . Penicillins Itching    Has patient had a PCN reaction causing immediate rash, facial/tongue/throat swelling, SOB or lightheadedness with hypotension: Yes Has patient had a PCN reaction causing severe rash involving mucus membranes or skin necrosis: No Has patient had a PCN reaction that required hospitalization: No Has patient had a PCN reaction occurring within the last 10 years: No If all of the above answers are "NO", then may proceed with Cephalosporin use.    Antimicrobials this admission: Vancomycin 2/8 >> Ceftriaxone 2/8  >>  Dose adjustments this admission: 2/11 vanc level 14. Changed to 1 gram q 24 hours. Level before 3rd new dose.   Microbiology results: BCx 2/8 >> NGTD Wound Cx 2/8 >> sent   Thank you for allowing pharmacy to be a part of this patient's care.  Lendon Ka, PharmD Pharmacy Resident 04/09/2017 3:55 AM

## 2017-04-09 NOTE — Progress Notes (Signed)
[facility] NAME: [name] [name]    MR#:  [id number]  DATE OF BIRTH:  10/24/1930  SUBJECTIVE:  CHIEF COMPLAINT:   Chief Complaint  Patient presents with  . Cellulitis  Patient without complaint, bedside, and discussion with infectious disease-we will proceed with PICC line placement for long-term antibiotics as cultures appear to be negative for growth thus far  REVIEW OF SYSTEMS:  CONSTITUTIONAL: No fever, fatigue or weakness.  EYES: No blurred or double vision.  EARS, NOSE, AND THROAT: No tinnitus or ear pain.  RESPIRATORY: No cough, shortness of breath, wheezing or hemoptysis.  CARDIOVASCULAR: No chest pain, orthopnea, edema.  GASTROINTESTINAL: No nausea, vomiting, diarrhea or abdominal pain.  GENITOURINARY: No dysuria, hematuria.  ENDOCRINE: No polyuria, nocturia,  HEMATOLOGY: No anemia, easy bruising or bleeding SKIN: No rash or lesion. MUSCULOSKELETAL: No joint pain or arthritis.   NEUROLOGIC: No tingling, numbness, weakness.  PSYCHIATRY: No anxiety or depression.   ROS  DRUG ALLERGIES:   Allergies  Allergen Reactions  . Atorvastatin Other (See Comments)    Myositis  . Bisphosphonates Other (See Comments)    GI upset  . Codeine Other (See Comments)  . Memantine Other (See Comments)  . Omeprazole Nausea Only  . Other Other (See Comments)    Leg cramps GI upset  . Salsalate Other (See Comments)  . Sulfa Antibiotics     Pt doesn't remember  . Penicillins Itching    Has patient had a PCN reaction causing immediate rash, facial/tongue/throat swelling, SOB or lightheadedness with hypotension: Yes Has patient had a PCN reaction causing severe rash involving mucus membranes or skin necrosis: No Has patient had a PCN reaction that required hospitalization: No Has patient had a PCN reaction occurring within the last 10 years: No If all of the above answers are "NO", then may proceed with Cephalosporin use.    VITALS:   Blood pressure (!) 137/45, pulse 64, temperature 99.1 F (37.3 C), temperature source Oral, resp. rate 18, height 5\' 3"  (1.6 m), weight 65.8 kg (145 lb), SpO2 95 %.  PHYSICAL EXAMINATION:  GENERAL:  84 y.o.-year-old patient lying in the bed with no acute distress.  EYES: Pupils equal, round, reactive to light and accommodation. No scleral icterus. Extraocular muscles intact.  HEENT: Head atraumatic, normocephalic. Oropharynx and nasopharynx clear.  NECK:  Supple, no jugular venous distention. No thyroid enlargement, no tenderness.  LUNGS: Normal breath sounds bilaterally, no wheezing, rales,rhonchi or crepitation. No use of accessory muscles of respiration.  CARDIOVASCULAR: S1, S2 normal. No murmurs, rubs, or gallops.  ABDOMEN: Soft, nontender, nondistended. Bowel sounds present. No organomegaly or mass.  EXTREMITIES: No pedal edema, cyanosis, or clubbing.  NEUROLOGIC: Cranial nerves II through XII are intact. Muscle strength 5/5 in all extremities. Sensation intact. Gait not checked.  PSYCHIATRIC: The patient is alert and oriented x 3.  SKIN: No obvious rash, lesion, or ulcer.  Right lower extremity dressing clean/dry/intact  Physical Exam LABORATORY PANEL:   CBC Recent Labs  Lab 04/07/17 0400  WBC 15.4*  HGB 10.9*  HCT 33.7*  PLT 349   ------------------------------------------------------------------------------------------------------------------  Chemistries  Recent Labs  Lab 04/06/17 0929 04/07/17 0400 04/09/17 0108  NA 141 138  --   K 4.7 4.3  --   CL 108 104  --   CO2 23 22  --   GLUCOSE 96 119*  --   BUN 34* 26*  --   CREATININE 1.19* 1.07* 1.33*  CALCIUM 9.2 8.5*  --  AST 20  --   --   ALT 14  --   --   ALKPHOS 66  --   --   BILITOT 0.8  --   --    ------------------------------------------------------------------------------------------------------------------  Cardiac Enzymes No results for input(s): TROPONINI in the last 168  hours. ------------------------------------------------------------------------------------------------------------------  RADIOLOGY:  No results found.  ASSESSMENT AND PLAN:  1Acute deep-seated right foot infection/right foot abscess with surrounding cellulitis Very recent hospital discharge for same, failed outpatient doxycycline Resolving Continue empiric IV vancomycin/Rocephin,s/p I&D on April 08, 2017 by podiatry-recovering well, cultures thus far are negative for growth, in discussion with ID/Dr. Lottie Rater will plan for PICC line placement and long-term antibiotics with tentative plans for discharge back to inpatient rehab/skilled nursing facility on tomorrow MRI of the foot noted for abscess  2acute pain syndrome Controlled on current regiment Secondary to above as well assubacute rightfoot fractures  3chronic benign essential retention Stable on current regiment  4chronic poor appetite Stable ContinueMegace bid  Full code Condition stable Disposition back to inpatient rehab and1-2 days barring any complications   All the records are reviewed and case discussed with Care Management/Social Workerr. Management plans discussed with the patient, family and they are in agreement.  CODE STATUS: full  TOTAL TIME TAKING CARE OF THIS PATIENT: 35 minutes.     POSSIBLE D/C IN 1-2 DAYS, DEPENDING ON CLINICAL CONDITION.   Avel Peace Crimson Beer M.D on 04/09/2017   Between 7am to 6pm - Pager - [phone]  After 6pm go to [email or web address] - password [email or web address] [facility]  Office  [phone]  CC: Primary care physician; Idelle Crouch, MD  Note: This dictation was prepared with Dragon dictation along with smaller phrase technology. Any transcriptional errors that result from this process are unintentional.

## 2017-04-09 NOTE — Progress Notes (Signed)
[facility] Podiatry                                                      Patient Demographics  [name] [name], is a 84 y.o. female   MRN: [id number]   DOB - 1930-07-17  Admit Date - 04/06/2017    Outpatient Primary MD for the patient is Idelle Crouch, MD  Consult requested in the Hospital by Salary, Avel Peace, MD, On 04/09/2017    With History of -  Past Medical History:  Diagnosis Date  . breast cancer   . Fibrocystic breast disease    Severe; status post bilateral mastectomies and implants  . GERD (gastroesophageal reflux disease)    Severe; manifested vocal cord irritation, status-post Nissen fundcoplication in 10/4852  . History of ataxia    with questionable right upper and lower  . History of breast cancer   . History of exertional chest pain    with her last stress echo in 09/1999 being normal  . History of migraine    vascular  . History of shingles   . History of syncope 12/1999   With negative cardiac workup to include an echocardiogram, which revealed only mild to moderate MR, a CT scan of the chest, which was negative, and a Cardiolite stress test, which was normal  . Hyperlipidemia, unspecified   . Hypertension    CTA 05/2004 showed no renovascular cause  . Mild dementia   . Muscle atrophy    Positive FANA and anti- SCL70 antibodies; S/p biopsy without eosinic fascitis; Remicade discontinued  . Osteoarthritis    a. Cervical spine. b. Lumbar spine/spinal stenosis. [facility])  . Osteoporosis    a. Fosamax b. Right wrist fracture. c. Sternal fracture ([place])  . Rheumatoid arthritis without elevated rheumatoid factor (HCC)    a. Methotrexate. b. S/p Remicade. c. Seronegative. d. S/p Plaquenil. [facility])      Past Surgical History:  Procedure Laterality Date  . APPENDECTOMY    . CHOLECYSTECTOMY    . IRRIGATION AND DEBRIDEMENT ABSCESS Right  04/08/2017   Procedure: IRRIGATION AND DEBRIDEMENT ABSCESS with wound vac;  Surgeon: Albertine Patricia, DPM;  Location: [facility] ORS;  Service: Podiatry;  Laterality: Right;  . LAPAROSCOPIC NISSEN FUNDOPLICATION  62/7035  . LUMBAR LAMINECTOMY     for spinal stenosis  . MASTECTOMY Bilateral    with implants  . ORIF DISTAL RADIUS FRACTURE Right 07/27/2008   with volar plate  . TOTAL ABDOMINAL HYSTERECTOMY W/ BILATERAL SALPINGOOPHORECTOMY    . VASCULAR SURGERY      in for   Chief Complaint  Patient presents with  . Cellulitis     HPI  [name] [name]  is a 84 y.o. female, 1 day status post I&D of abscess hematoma dorsum right foot.  Wound VAC also placed    Review of Systems    In addition to the HPI above,  No Fever-chills, No Headache, No changes with Vision or hearing, No problems swallowing food or Liquids, No Chest pain, Cough or Shortness of Breath, No Abdominal pain, No Nausea or Vommitting, Bowel movements are regular, No Blood in stool or Urine, No dysuria, No new skin rashes or bruises, No new joints pains-aches,  No new weakness, tingling, numbness in any extremity, No recent weight gain or loss, No polyuria, polydypsia or polyphagia, No significant Mental  Stressors.  A full 10 point Review of Systems was done, except as stated above, all other Review of Systems were negative.   Social History Social History   Tobacco Use  . Smoking status: Never Smoker  . Smokeless tobacco: Never Used  Substance Use Topics  . Alcohol use: No    Family History Family History  Problem Relation Age of Onset  . Heart failure Mother   . Hypertension Mother   . Cancer Father   . Heart attack Sister     Prior to Admission medications   Medication Sig Start Date End Date Taking? Authorizing Provider  acetaminophen (TYLENOL) 650 MG CR tablet Take 1,300 mg by mouth 2 (two) times daily.    Yes [provider]  amLODipine (NORVASC) 5 MG tablet Take 10 mg by mouth  daily. 2 tabs   Yes [provider]  Ascorbic Acid (VITAMIN C) 1000 MG tablet Take 1,000 mg by mouth daily.   Yes [provider]  aspirin EC 81 MG tablet Take 81 mg by mouth daily.   Yes [provider]  doxycycline (VIBRAMYCIN) 100 MG capsule Take 1 capsule by mouth 2 (two) times daily. 04/03/17  Yes [provider]  folic acid (FOLVITE) 1 MG tablet Take 1 mg by mouth daily.   Yes [provider]  isosorbide mononitrate (IMDUR) 30 MG 24 hr tablet Take 30 mg by mouth daily.    Yes [provider]  losartan (COZAAR) 100 MG tablet Take 100 mg by mouth daily.    Yes [provider]  meloxicam (MOBIC) 7.5 MG tablet Take 1 tablet (7.5 mg total) by mouth daily. 04/04/17  Yes Salary, Avel Peace, MD  methotrexate (RHEUMATREX) 2.5 MG tablet Take 6 tablets by mouth every Friday.  10/28/15  Yes [provider]  metoprolol (LOPRESSOR) 100 MG tablet Take 100 mg by mouth 2 (two) times daily.    Yes [provider]  mirtazapine (REMERON) 7.5 MG tablet Take 7.5 mg by mouth at bedtime.   Yes [provider]  Multiple Vitamin (MULTI-VITAMINS) TABS Take 1 tablet by mouth daily.   Yes [provider]  oxyCODONE (OXY IR/ROXICODONE) 5 MG immediate release tablet Take 1 tablet (5 mg total) by mouth every 4 (four) hours as needed for severe pain. 04/04/17  Yes Toni Arthurs, NP  sertraline (ZOLOFT) 100 MG tablet Take 100 mg by mouth daily.   Yes [provider]  traMADol (ULTRAM) 50 MG tablet Take 1 tablet (50 mg total) by mouth every 6 (six) hours as needed for moderate pain. 04/04/17   Toni Arthurs, NP    Anti-infectives (From admission, onward)   Start     Dose/Rate Route Frequency Ordered Stop   04/10/17 0000  vancomycin (VANCOCIN) IVPB 1000 mg/200 mL premix     1,000 mg 200 mL/hr over 60 Minutes Intravenous Every 24 hours 04/09/17 0356     04/09/17 1800  vancomycin (VANCOCIN) IVPB 1000 mg/200 mL premix  Status:   Discontinued     1,000 mg 200 mL/hr over 60 Minutes Intravenous Every 24 hours 04/09/17 0354 04/09/17 0356   04/09/17 1000  cefTRIAXone (ROCEPHIN) 1 g in sodium chloride 0.9 % 100 mL IVPB     1 g 200 mL/hr over 30 Minutes Intravenous Every 24 hours 04/09/17 0905     04/07/17 1000  cefTRIAXone (ROCEPHIN) 1 g in dextrose 5 % 50 mL IVPB  Status:  Discontinued     1 g 100 mL/hr over 30  Minutes Intravenous Every 24 hours 04/06/17 1549 04/09/17 0905   04/07/17 0200  vancomycin (VANCOCIN) IVPB 750 mg/150 ml premix  Status:  Discontinued     750 mg 150 mL/hr over 60 Minutes Intravenous Every 24 hours 04/06/17 1636 04/09/17 0354   04/06/17 1245  vancomycin (VANCOCIN) IVPB 1000 mg/200 mL premix     1 g Intravenous  Once 04/06/17 1241 04/06/17 1352   04/06/17 1245  cefTRIAXone (ROCEPHIN) 1 g in dextrose 5 % 50 mL IVPB     1 g 100 mL/hr over 30 Minutes Intravenous  Once 04/06/17 1241 04/06/17 1352      Scheduled Meds: . amLODipine  10 mg Oral Daily  . aspirin EC  81 mg Oral Daily  . docusate sodium  100 mg Oral BID  . folic acid  1 mg Oral Daily  . heparin  5,000 Units Subcutaneous Q8H  . isosorbide mononitrate  30 mg Oral Daily  . losartan  100 mg Oral Daily  . meloxicam  7.5 mg Oral Daily  . metoprolol tartrate  100 mg Oral BID  . mirtazapine  7.5 mg Oral QHS  . multivitamin with minerals  1 tablet Oral Daily  . sertraline  100 mg Oral Daily  . vitamin C  1,000 mg Oral Daily   Continuous Infusions: . cefTRIAXone (ROCEPHIN)  IV Stopped (04/09/17 1007)  . [START ON 04/10/2017] vancomycin     PRN Meds:.acetaminophen **OR** acetaminophen, bisacodyl, hydrALAZINE, ondansetron **OR** ondansetron (ZOFRAN) IV, oxyCODONE, polyethylene glycol, traMADol  Allergies  Allergen Reactions  . Atorvastatin Other (See Comments)    Myositis  . Bisphosphonates Other (See Comments)    GI upset  . Codeine Other (See Comments)  . Memantine Other (See Comments)  . Omeprazole Nausea Only  . Other Other  (See Comments)    Leg cramps GI upset  . Salsalate Other (See Comments)  . Sulfa Antibiotics     Pt doesn't remember  . Penicillins Itching    Has patient had a PCN reaction causing immediate rash, facial/tongue/throat swelling, SOB or lightheadedness with hypotension: Yes Has patient had a PCN reaction causing severe rash involving mucus membranes or skin necrosis: No Has patient had a PCN reaction that required hospitalization: No Has patient had a PCN reaction occurring within the last 10 years: No If all of the above answers are "NO", then may proceed with Cephalosporin use.    Physical Exam  Vitals  Blood pressure (!) 137/45, pulse 64, temperature 99.1 F (37.3 C), temperature source Oral, resp. rate 18, height 5\' 3"  (1.6 m), weight 65.8 kg (145 lb), SpO2 95 %.  Lower Extremity exam: Wound vacs intact and stable.  Patient states she has not really have any pain at this juncture.  Pain level is improved as compared to preop.  Data Review  CBC Recent Labs  Lab 04/06/17 0929 04/07/17 0400  WBC 16.4* 15.4*  HGB 11.6* 10.9*  HCT 34.9* 33.7*  PLT 345 349  MCV 117.6* 117.5*  MCH 39.0* 38.1*  MCHC 33.2 32.5  RDW 16.4* 16.2*  LYMPHSABS 0.8*  --   MONOABS 2.5*  --   EOSABS 0.2  --   BASOSABS 0.2*  --    ------------------------------------------------------------------------------------------------------------------  Chemistries  Recent Labs  Lab 04/06/17 0929 04/07/17 0400 04/09/17 0108  NA 141 138  --   K 4.7 4.3  --   CL 108 104  --   CO2 23 22  --   GLUCOSE 96 119*  --   BUN  34* 26*  --   CREATININE 1.19* 1.07* 1.33*  CALCIUM 9.2 8.5*  --   AST 20  --   --   ALT 14  --   --   ALKPHOS 66  --   --   BILITOT 0.8  --   --    ---------------------------------------------------------------------------------- Assessment & Plan: Patient stable from overall standpoint.  Likely changes wound VAC tomorrow.  Restart physical therapy tomorrow for transfer to a chair  and bedside toilet.  I think she is scheduled for a vascular evaluation catheterization and possible angioplasty as needed today.  Active Problems:   Foot abscess   Family Communication: Plan discussed with patient and   Albertine Patricia M.D on 04/09/2017 at 12:38 PM  Thank you for the consult, we will follow the patient with you in the Hospital.

## 2017-04-09 NOTE — Progress Notes (Signed)
Infectious Disease Long Term IV Antibiotic Orders Abbe Bula [facility] [birth year]  Diagnosis: R foot infected hematoma following fracture  Culture results Negative  LABS Lab Results  Component Value Date   CREATININE 1.33 (H) 04/09/2017   Lab Results  Component Value Date   WBC 15.4 (H) 04/07/2017   HGB 10.9 (L) 04/07/2017   HCT 33.7 (L) 04/07/2017   MCV 117.5 (H) 04/07/2017   PLT 349 04/07/2017   Lab Results  Component Value Date   ESRSEDRATE 52 (H) 03/26/2017   No results found for: CRP  Allergies:  Allergies  Allergen Reactions  . Atorvastatin Other (See Comments)    Myositis  . Bisphosphonates Other (See Comments)    GI upset  . Codeine Other (See Comments)  . Memantine Other (See Comments)  . Omeprazole Nausea Only  . Other Other (See Comments)    Leg cramps GI upset  . Salsalate Other (See Comments)  . Sulfa Antibiotics     Pt doesn't remember  . Penicillins Itching    Has patient had a PCN reaction causing immediate rash, facial/tongue/throat swelling, SOB or lightheadedness with hypotension: Yes Has patient had a PCN reaction causing severe rash involving mucus membranes or skin necrosis: No Has patient had a PCN reaction that required hospitalization: No Has patient had a PCN reaction occurring within the last 10 years: No If all of the above answers are "NO", then may proceed with Cephalosporin use.    Discharge antibiotics Ceftriaxone 1 grams every      24         hours  PICC Care per protocol Labs weekly while on IV antibiotics -FAX weekly labs to [phone] CBC w diff   Comprehensive met panel  CRP   Planned duration of antibiotics 3- 4 weeks IV  Stop date 2/28 Follow up clinic date 1-2 weeks   Leonel Ramsay, MD

## 2017-04-09 NOTE — Progress Notes (Signed)
Clinical Education officer, museum (CSW) made [employer] at [facility] aware that patient will need a wound vac and IV ABX.   [employer], LCSW [phone]

## 2017-04-09 NOTE — Progress Notes (Signed)
Peripherally Inserted Central Catheter/Midline Placement  The IV Nurse has discussed with the patient and/or persons authorized to consent for the patient, the purpose of this procedure and the potential benefits and risks involved with this procedure.  The benefits include less needle sticks, lab draws from the catheter, and the patient may be discharged home with the catheter. Risks include, but not limited to, infection, bleeding, blood clot (thrombus formation), and puncture of an artery; nerve damage and irregular heartbeat and possibility to perform a PICC exchange if needed/ordered by physician.  Alternatives to this procedure were also discussed.  Bard Power PICC patient education guide, fact sheet on infection prevention and patient information card has been provided to patient /or left at bedside.    PICC/Midline Placement Documentation  PICC Single Lumen 93/11/21 PICC Right Basilic 38 cm 0 cm (Active)  Indication for Insertion or Continuance of Line Limited venous access - need for IV therapy >5 days (PICC only) 04/09/2017 10:00 PM  Exposed Catheter (cm) 0 cm 04/09/2017 10:00 PM  Site Assessment Clean;Dry;Intact 04/09/2017 10:00 PM  Line Status Flushed;Saline locked;Blood return noted 04/09/2017 10:00 PM  Dressing Type Transparent;Securing device 04/09/2017 10:00 PM  Dressing Status Clean;Dry;Intact;Antimicrobial disc in place 04/09/2017 10:00 PM  Line Care Connections checked and tightened 04/09/2017 10:00 PM  Dressing Change Due 04/16/17 04/09/2017 10:00 PM       Virgilio Belling 04/09/2017, 10:14 PM

## 2017-04-09 NOTE — Plan of Care (Signed)
  Education: Knowledge of General Education information will improve 04/09/2017 0500 - Progressing by Leilanny Fluitt, Lucille Passy, RN   Health Behavior/Discharge Planning: Ability to manage health-related needs will improve 04/09/2017 0500 - Progressing by Temperance Kelemen, Lucille Passy, RN   Clinical Measurements: Ability to maintain clinical measurements within normal limits will improve 04/09/2017 0500 - Progressing by Ashir Kunz, Lucille Passy, RN Will remain free from infection 04/09/2017 0500 - Progressing by Rafeef Lau, Lucille Passy, RN Diagnostic test results will improve 04/09/2017 0500 - Progressing by Heriberto Stmartin, Lucille Passy, RN Respiratory complications will improve 04/09/2017 0500 - Progressing by Wynelle Cleveland Lucille Passy, RN Cardiovascular complication will be avoided 04/09/2017 0500 - Progressing by Saia Derossett, Lucille Passy, RN

## 2017-04-09 NOTE — Progress Notes (Signed)
[facility] INFECTIOUS DISEASE PROGRESS NOTE Date of Admission:  04/06/2017     ID: KARON COTTERILL is a 84 y.o. female with foot abscess Active Problems:   Foot abscess   Subjective: S/p I and D. No fevers, cx neg  ROS  Eleven systems are reviewed and negative except per hpi  Medications:  Antibiotics Given (last 72 hours)    Date/Time Action Medication Dose Rate   04/07/17 0106 New Bag/Given   vancomycin (VANCOCIN) IVPB 750 mg/150 ml premix 750 mg 150 mL/hr   04/07/17 1108 New Bag/Given   cefTRIAXone (ROCEPHIN) 1 g in dextrose 5 % 50 mL IVPB 1 g 100 mL/hr   04/08/17 0300 New Bag/Given   vancomycin (VANCOCIN) IVPB 750 mg/150 ml premix 750 mg 150 mL/hr   04/09/17 0243 New Bag/Given   vancomycin (VANCOCIN) IVPB 750 mg/150 ml premix 750 mg 150 mL/hr   04/09/17 1829 New Bag/Given   cefTRIAXone (ROCEPHIN) 1 g in sodium chloride 0.9 % 100 mL IVPB 1 g 200 mL/hr     . amLODipine  10 mg Oral Daily  . aspirin EC  81 mg Oral Daily  . docusate sodium  100 mg Oral BID  . folic acid  1 mg Oral Daily  . heparin  5,000 Units Subcutaneous Q8H  . isosorbide mononitrate  30 mg Oral Daily  . losartan  100 mg Oral Daily  . meloxicam  7.5 mg Oral Daily  . metoprolol tartrate  100 mg Oral BID  . mirtazapine  7.5 mg Oral QHS  . multivitamin with minerals  1 tablet Oral Daily  . sertraline  100 mg Oral Daily  . vitamin C  1,000 mg Oral Daily    Objective: Vital signs in last 24 hours: Temp:  [98.2 F (36.8 C)-99.5 F (37.5 C)] 99.1 F (37.3 C) (02/11 0800) Pulse Rate:  [63-74] 64 (02/11 1211) Resp:  [16-18] 18 (02/11 1211) BP: (130-163)/(44-54) 137/45 (02/11 1211) SpO2:  [91 %-97 %] 95 % (02/11 1211) Constitutional:  oriented to person, place, and time. Thin, frail. HENT: [place]/AT, PERRLA, no scleral icterus Mouth/Throat: Oropharynx is clear and moist. No oropharyngeal exudate.  Cardiovascular: Normal rate, regular rhythm and normal heart sounds.  Pulmonary/Chest: Effort normal and  breath sounds normal. No respiratory distress.  has no wheezes.  Neck = supple, no nuchal rigidity Abdominal: Soft. Bowel sounds are normal.  exhibits no distension. There is no tenderness.  Lymphadenopathy: no cervical adenopathy. No axillary adenopathy Neurological: alert and oriented to person, place, and time.  Ext R foot with 2+ edema Skin: R foot wrapped post op. Psychiatric: a normal moond affect.  behavior is normal.      Lab Results Recent Labs    04/07/17 0400 04/09/17 0108  WBC 15.4*  --   HGB 10.9*  --   HCT 33.7*  --   NA 138  --   K 4.3  --   CL 104  --   CO2 22  --   BUN 26*  --   CREATININE 1.07* 1.33*    Microbiology: Results for orders placed or performed during the hospital encounter of 04/06/17  Culture, blood (routine x 2)     Status: None (Preliminary result)   Collection Time: 04/06/17 10:54 AM  Result Value Ref Range Status   Specimen Description   Final    BLOOD Blood Culture results may not be optimal due to an inadequate volume of blood received in culture bottles   Special Requests RIGHT ANTECUBITAL  Final   Culture   Final    NO GROWTH 3 DAYS Performed at [facility], [address] [place], [place] [address]    Report Status PENDING  Incomplete  Wound or Superficial Culture     Status: None (Preliminary result)   Collection Time: 04/06/17 12:59 PM  Result Value Ref Range Status   Specimen Description   Final    FOOT Performed at [facility], [address], [place], [place] [address]    Special Requests   Final    RIGHT Performed at [facility], [address] [place], [place] [address]    Gram Stain   Final    MODERATE WBC PRESENT,BOTH PMN AND MONONUCLEAR NO ORGANISMS SEEN    Culture   Final    NO GROWTH 2 DAYS Performed at [facility], [place] [address] [place], [place] [address]    Report Status PENDING  Incomplete  Aerobic/Anaerobic Culture (surgical/deep wound)     Status: None  (Preliminary result)   Collection Time: 04/06/17  5:28 PM  Result Value Ref Range Status   Specimen Description   Final    FOOT Performed at [facility], [address], [place], [place] [address]    Special Requests   Final    LEFT Performed at [facility], [address] [place], [place] [address]    Gram Stain   Final    FEW WBC PRESENT, PREDOMINANTLY PMN NO ORGANISMS SEEN    Culture   Final    NO GROWTH 3 DAYS NO ANAEROBES ISOLATED; CULTURE IN PROGRESS FOR 5 DAYS Performed at [facility], [place] [address], [place], [place] [address]    Report Status PENDING  Incomplete  Aerobic/Anaerobic Culture (surgical/deep wound)     Status: None (Preliminary result)   Collection Time: 04/08/17  9:07 AM  Result Value Ref Range Status   Specimen Description   Final    ABSCESS Performed at [facility], [address], [place], [place] [address]    Special Requests   Final    ABSCESS RIGHT FOOT Performed at [facility], [address] [place], [place] [address]    Gram Stain   Final    MODERATE WBC PRESENT, PREDOMINANTLY PMN NO ORGANISMS SEEN    Culture   Final    NO GROWTH < 24 HOURS Performed at [facility], [place] [address] [place], [place] [address]    Report Status PENDING  Incomplete    Studies/Results: No results found.  Assessment/Plan: AIRICA SCHWARTZKOPF is a 84 y.o. female with multiple fractures of R foot after a fall now admitted after recent dc on doxy with progressive worsening and now an open wound. At last admission was treated with vanco and ctx and improved. Had on 2/2  MRI showing no evidence osteomyelitis or abscess.  Cx last admission neg. Readmitted with  open wound with some necrotic tissue and drainage. Xray neg but MRI showed fluid collection. Underwent I and D 2/10 and noted to have some hematoma as well Likely secondary infection of hematoma.  Vascular has seen.   Recommendations DC vanco since no  MRSA found and previous MRSA cx neg Cont ceftriaxone (She is PCN allergic).  Most likely pathogens are Strep species and MSSA. Given foot wound however GNR also possible. Will plan on 2 week IV course. Podiatry consult. Consider vascular consult Place PICC Thank you very much for the consult. Will follow with  you.  Leonel Ramsay   04/09/2017, 1:04 PM

## 2017-04-09 NOTE — Care Management Important Message (Signed)
Important Message  Patient Details  Name: [name] [name] MRN: [id number] Date of Birth: September 30, 1930   Medicare Important Message Given:  Yes    Marshell Garfinkel, RN 04/09/2017, 8:51 AM

## 2017-04-10 DIAGNOSIS — S82891D Other fracture of right lower leg, subsequent encounter for closed fracture with routine healing: Secondary | ICD-10-CM | POA: Diagnosis not present

## 2017-04-10 DIAGNOSIS — M48062 Spinal stenosis, lumbar region with neurogenic claudication: Secondary | ICD-10-CM | POA: Diagnosis not present

## 2017-04-10 DIAGNOSIS — E119 Type 2 diabetes mellitus without complications: Secondary | ICD-10-CM | POA: Diagnosis not present

## 2017-04-10 DIAGNOSIS — M810 Age-related osteoporosis without current pathological fracture: Secondary | ICD-10-CM | POA: Diagnosis not present

## 2017-04-10 DIAGNOSIS — Z452 Encounter for adjustment and management of vascular access device: Secondary | ICD-10-CM | POA: Diagnosis not present

## 2017-04-10 DIAGNOSIS — L03115 Cellulitis of right lower limb: Secondary | ICD-10-CM | POA: Diagnosis not present

## 2017-04-10 DIAGNOSIS — I10 Essential (primary) hypertension: Secondary | ICD-10-CM | POA: Diagnosis not present

## 2017-04-10 DIAGNOSIS — R262 Difficulty in walking, not elsewhere classified: Secondary | ICD-10-CM | POA: Diagnosis not present

## 2017-04-10 DIAGNOSIS — M069 Rheumatoid arthritis, unspecified: Secondary | ICD-10-CM | POA: Diagnosis not present

## 2017-04-10 DIAGNOSIS — L02611 Cutaneous abscess of right foot: Secondary | ICD-10-CM | POA: Diagnosis not present

## 2017-04-10 DIAGNOSIS — S92414D Nondisplaced fracture of proximal phalanx of right great toe, subsequent encounter for fracture with routine healing: Secondary | ICD-10-CM | POA: Diagnosis not present

## 2017-04-10 DIAGNOSIS — Z8781 Personal history of (healed) traumatic fracture: Secondary | ICD-10-CM | POA: Diagnosis not present

## 2017-04-10 DIAGNOSIS — R1111 Vomiting without nausea: Secondary | ICD-10-CM | POA: Diagnosis not present

## 2017-04-10 DIAGNOSIS — L97513 Non-pressure chronic ulcer of other part of right foot with necrosis of muscle: Secondary | ICD-10-CM | POA: Diagnosis not present

## 2017-04-10 DIAGNOSIS — L089 Local infection of the skin and subcutaneous tissue, unspecified: Secondary | ICD-10-CM | POA: Diagnosis not present

## 2017-04-10 DIAGNOSIS — Z79899 Other long term (current) drug therapy: Secondary | ICD-10-CM | POA: Diagnosis not present

## 2017-04-10 DIAGNOSIS — F3341 Major depressive disorder, recurrent, in partial remission: Secondary | ICD-10-CM | POA: Diagnosis not present

## 2017-04-10 DIAGNOSIS — Z7982 Long term (current) use of aspirin: Secondary | ICD-10-CM | POA: Diagnosis not present

## 2017-04-10 DIAGNOSIS — Z9181 History of falling: Secondary | ICD-10-CM | POA: Diagnosis not present

## 2017-04-10 DIAGNOSIS — Z792 Long term (current) use of antibiotics: Secondary | ICD-10-CM | POA: Diagnosis not present

## 2017-04-10 DIAGNOSIS — M6281 Muscle weakness (generalized): Secondary | ICD-10-CM | POA: Diagnosis not present

## 2017-04-10 DIAGNOSIS — S92411D Displaced fracture of proximal phalanx of right great toe, subsequent encounter for fracture with routine healing: Secondary | ICD-10-CM | POA: Diagnosis not present

## 2017-04-10 DIAGNOSIS — R55 Syncope and collapse: Secondary | ICD-10-CM | POA: Diagnosis not present

## 2017-04-10 DIAGNOSIS — F0391 Unspecified dementia with behavioral disturbance: Secondary | ICD-10-CM | POA: Diagnosis not present

## 2017-04-10 DIAGNOSIS — F23 Brief psychotic disorder: Secondary | ICD-10-CM | POA: Diagnosis not present

## 2017-04-10 DIAGNOSIS — M0600 Rheumatoid arthritis without rheumatoid factor, unspecified site: Secondary | ICD-10-CM | POA: Diagnosis not present

## 2017-04-10 DIAGNOSIS — L02619 Cutaneous abscess of unspecified foot: Secondary | ICD-10-CM | POA: Diagnosis not present

## 2017-04-10 DIAGNOSIS — Z853 Personal history of malignant neoplasm of breast: Secondary | ICD-10-CM | POA: Diagnosis not present

## 2017-04-10 DIAGNOSIS — R1110 Vomiting, unspecified: Secondary | ICD-10-CM | POA: Diagnosis not present

## 2017-04-10 DIAGNOSIS — I272 Pulmonary hypertension, unspecified: Secondary | ICD-10-CM | POA: Diagnosis not present

## 2017-04-10 MED ORDER — DOCUSATE SODIUM 100 MG PO CAPS: 100.0000 mg | ORAL_CAPSULE | Freq: Two times a day (BID) | ORAL | 0 refills | Status: DC

## 2017-04-10 MED ORDER — SODIUM CHLORIDE 0.9 % IV SOLN: 1.0000 g | INTRAVENOUS | 0 refills | Status: DC

## 2017-04-10 MED ORDER — OXYCODONE HCL 5 MG PO TABS: 5.0000 mg | ORAL_TABLET | ORAL | 0 refills | Status: DC | PRN

## 2017-04-10 MED ORDER — SODIUM CHLORIDE 0.9% FLUSH: 10.0000 mL | Freq: Two times a day (BID) | INTRAVENOUS | Status: DC

## 2017-04-10 MED ORDER — TRAMADOL HCL 50 MG PO TABS: 50.0000 mg | ORAL_TABLET | Freq: Four times a day (QID) | ORAL | 0 refills | Status: DC | PRN

## 2017-04-10 MED ORDER — SODIUM CHLORIDE 0.9% FLUSH: 10.0000 mL | INTRAVENOUS | Status: DC | PRN

## 2017-04-10 NOTE — Progress Notes (Signed)
[facility] authorization was started. PT is pending. Clinical Education officer, museum (CSW) made [employer] at [facility] aware of IV ABX orders.   [employer], LCSW [phone]

## 2017-04-10 NOTE — Progress Notes (Signed)
Patient is medically stable for D/C to [facility] today. [facility] authorization has been received. Per [facility] admissions coordinator at [facility] patient can come today to room 208-B. RN will call report at [phone] and arrange EMS for transport. Per Sharyn Lull they have a wound vac for patient. Patient is aware of above. CSW contacted patient's sister [name] and made her aware of above. Please reconsult if future social work needs arise. CSW signing off.   [employer], LCSW [phone]

## 2017-04-10 NOTE — Discharge Summary (Addendum)
[facility] [place] NAME: [name] [name]    MR#:  [id number]  DATE OF BIRTH:  07-29-1930  DATE OF ADMISSION:  04/06/2017 ADMITTING PHYSICIAN: Gorden Harms, MD  DATE OF DISCHARGE: No discharge date for patient encounter.  PRIMARY CARE PHYSICIAN: Idelle Crouch, MD    ADMISSION DIAGNOSIS:  Cellulitis of foot without toes, right [L03.115] Acute renal failure, unspecified acute renal failure type ([place]) [N17.9]  DISCHARGE DIAGNOSIS:  Active Problems:   Foot abscess   SECONDARY DIAGNOSIS:   Past Medical History:  Diagnosis Date  . breast cancer   . Fibrocystic breast disease    Severe; status post bilateral mastectomies and implants  . GERD (gastroesophageal reflux disease)    Severe; manifested vocal cord irritation, status-post Nissen fundcoplication in 08/1243  . History of ataxia    with questionable right upper and lower  . History of breast cancer   . History of exertional chest pain    with her last stress echo in 09/1999 being normal  . History of migraine    vascular  . History of shingles   . History of syncope 12/1999   With negative cardiac workup to include an echocardiogram, which revealed only mild to moderate MR, a CT scan of the chest, which was negative, and a Cardiolite stress test, which was normal  . Hyperlipidemia, unspecified   . Hypertension    CTA 05/2004 showed no renovascular cause  . Mild dementia   . Muscle atrophy    Positive FANA and anti- SCL70 antibodies; S/p biopsy without eosinic fascitis; Remicade discontinued  . Osteoarthritis    a. Cervical spine. b. Lumbar spine/spinal stenosis. [facility])  . Osteoporosis    a. Fosamax b. Right wrist fracture. c. Sternal fracture ([place])  . Rheumatoid arthritis without elevated rheumatoid factor (HCC)    a. Methotrexate. b. S/p Remicade. c. Seronegative. d. S/p Plaquenil. [facility])    HOSPITAL COURSE:  1Acute deep-seated right foot infection/right foot  abscess with surrounding cellulitis Very recent hospital discharge for same, failed outpatient doxycycline, infected hematoma per vascular surgery Resolving Treated initially with empiric IV vancomycin/Rocephin,s/p I&D on April 08, 2017 with wound VAC placement bypodiatry-recovering well, cultures thus far are negative for growth, wound VAC to be changed every 3 days, in discussion with ID/Dr. Fitzgerald-status post PICC line placement for 3-4 weeks antibiotic course of IV Rocephin  MRI of the foot noted for abscess Patient evaluated by vascular surgery-no intervention recommended  2acute pain syndrome Controlled on current regiment Secondary to above as well assubacute rightfoot fractures  3chronic benign essential retention Stable on current regiment  4chronic poor appetite Improved ContinuedMegace bid   DISCHARGE CONDITIONS:  On day of discharge patient is afebrile, hemodynamic data stable, tolerating diet, ready for discharge to home back to rehab facility, for more specific details please see chart, follow-up appointments arranged for with infectious disease as well as podiatry  CONSULTS OBTAINED:  Treatment Team:  Leonel Ramsay, MD Albertine Patricia, DPM  DRUG ALLERGIES:   Allergies  Allergen Reactions  . Atorvastatin Other (See Comments)    Myositis  . Bisphosphonates Other (See Comments)    GI upset  . Codeine Other (See Comments)  . Memantine Other (See Comments)  . Omeprazole Nausea Only  . Other Other (See Comments)    Leg cramps GI upset  . Salsalate Other (See Comments)  . Sulfa Antibiotics     Pt doesn't remember  . Penicillins Itching  Has patient had a PCN reaction causing immediate rash, facial/tongue/throat swelling, SOB or lightheadedness with hypotension: Yes Has patient had a PCN reaction causing severe rash involving mucus membranes or skin necrosis: No Has patient had a PCN reaction that required hospitalization: No Has  patient had a PCN reaction occurring within the last 10 years: No If all of the above answers are "NO", then may proceed with Cephalosporin use.    DISCHARGE MEDICATIONS:   Allergies as of 04/10/2017      Reactions   Atorvastatin Other (See Comments)   Myositis   Bisphosphonates Other (See Comments)   GI upset   Codeine Other (See Comments)   Memantine Other (See Comments)   Omeprazole Nausea Only   Other Other (See Comments)   Leg cramps GI upset   Salsalate Other (See Comments)   Sulfa Antibiotics    Pt doesn't remember   Penicillins Itching   Has patient had a PCN reaction causing immediate rash, facial/tongue/throat swelling, SOB or lightheadedness with hypotension: Yes Has patient had a PCN reaction causing severe rash involving mucus membranes or skin necrosis: No Has patient had a PCN reaction that required hospitalization: No Has patient had a PCN reaction occurring within the last 10 years: No If all of the above answers are "NO", then may proceed with Cephalosporin use.      Medication List    STOP taking these medications   doxycycline 100 MG capsule Commonly known as:  VIBRAMYCIN     TAKE these medications   acetaminophen 650 MG CR tablet Commonly known as:  TYLENOL Take 1,300 mg by mouth 2 (two) times daily.   amLODipine 5 MG tablet Commonly known as:  NORVASC Take 10 mg by mouth daily. 2 tabs   aspirin EC 81 MG tablet Take 81 mg by mouth daily.   cefTRIAXone 1 g in sodium chloride 0.9 % 100 mL Inject 1 g into the vein daily. Start taking on:  04/11/2017   docusate sodium 100 MG capsule Commonly known as:  COLACE Take 1 capsule (100 mg total) by mouth 2 (two) times daily.   folic acid 1 MG tablet Commonly known as:  FOLVITE Take 1 mg by mouth daily.   isosorbide mononitrate 30 MG 24 hr tablet Commonly known as:  IMDUR Take 30 mg by mouth daily.   losartan 100 MG tablet Commonly known as:  COZAAR Take 100 mg by mouth daily.   meloxicam 7.5  MG tablet Commonly known as:  MOBIC Take 1 tablet (7.5 mg total) by mouth daily.   methotrexate 2.5 MG tablet Commonly known as:  RHEUMATREX Take 6 tablets by mouth every Friday.   metoprolol tartrate 100 MG tablet Commonly known as:  LOPRESSOR Take 100 mg by mouth 2 (two) times daily.   mirtazapine 7.5 MG tablet Commonly known as:  REMERON Take 7.5 mg by mouth at bedtime.   MULTI-VITAMINS Tabs Take 1 tablet by mouth daily.   oxyCODONE 5 MG immediate release tablet Commonly known as:  Oxy IR/ROXICODONE Take 1 tablet (5 mg total) by mouth every 4 (four) hours as needed for severe pain.   sertraline 100 MG tablet Commonly known as:  ZOLOFT Take 100 mg by mouth daily.   traMADol 50 MG tablet Commonly known as:  ULTRAM Take 1 tablet (50 mg total) by mouth every 6 (six) hours as needed for moderate pain.   vitamin C 1000 MG tablet Take 1,000 mg by mouth daily.  DISCHARGE INSTRUCTIONS:   If you experience worsening of your admission symptoms, develop shortness of breath, life threatening emergency, suicidal or homicidal thoughts you must seek medical attention immediately by calling 911 or calling your MD immediately  if symptoms less severe.  You Must read complete instructions/literature along with all the possible adverse reactions/side effects for all the Medicines you take and that have been prescribed to you. Take any new Medicines after you have completely understood and accept all the possible adverse reactions/side effects.   Please note  You were cared for by a hospitalist during your hospital stay. If you have any questions about your discharge medications or the care you received while you were in the hospital after you are discharged, you can call the unit and asked to speak with the hospitalist on call if the hospitalist that took care of you is not available. Once you are discharged, your primary care physician will handle any further medical issues.  Please note that NO REFILLS for any discharge medications will be authorized once you are discharged, as it is imperative that you return to your primary care physician (or establish a relationship with a primary care physician if you do not have one) for your aftercare needs so that they can reassess your need for medications and monitor your lab values.    Today   CHIEF COMPLAINT:   Chief Complaint  Patient presents with  . Cellulitis    HISTORY OF PRESENT ILLNESS:  84 y.o. female with a known history per below, recent hospital discharge for deep-seated right foot infection, sent home on p.o. doxycycline, returns today with worsening right foot infection, white count of 16,000, patient seen by infectious disease last admission-seen again in the emergency room/recommended IV vancomycin/Rocephin with PICC line placement, podiatry to see for probable need for I&D/debridement, patient in no apparent distress, resting comfortably in bed, patient is now being admitted for acute deep-seated right foot infection/probable abscess. VITAL SIGNS:  Blood pressure (!) 159/59, pulse 84, temperature 99.7 F (37.6 C), temperature source Oral, resp. rate 18, height 5\' 3"  (1.6 m), weight 65.8 kg (145 lb), SpO2 92 %.  I/O:    Intake/Output Summary (Last 24 hours) at 04/10/2017 1053 Last data filed at 04/10/2017 0901 Gross per 24 hour  Intake 480 ml  Output 450 ml  Net 30 ml    PHYSICAL EXAMINATION:  GENERAL:  84 y.o.-year-old patient lying in the bed with no acute distress.  EYES: Pupils equal, round, reactive to light and accommodation. No scleral icterus. Extraocular muscles intact.  HEENT: Head atraumatic, normocephalic. Oropharynx and nasopharynx clear.  NECK:  Supple, no jugular venous distention. No thyroid enlargement, no tenderness.  LUNGS: Normal breath sounds bilaterally, no wheezing, rales,rhonchi or crepitation. No use of accessory muscles of respiration.  CARDIOVASCULAR: S1, S2 normal.  No murmurs, rubs, or gallops.  ABDOMEN: Soft, non-tender, non-distended. Bowel sounds present. No organomegaly or mass.  EXTREMITIES: No pedal edema, cyanosis, or clubbing.  NEUROLOGIC: Cranial nerves II through XII are intact. Muscle strength 5/5 in all extremities. Sensation intact. Gait not checked.  PSYCHIATRIC: The patient is alert and oriented x 3.  SKIN: No obvious rash, lesion, or ulcer.   DATA REVIEW:   CBC Recent Labs  Lab 04/07/17 0400  WBC 15.4*  HGB 10.9*  HCT 33.7*  PLT 349    Chemistries  Recent Labs  Lab 04/06/17 0929 04/07/17 0400 04/09/17 0108  NA 141 138  --   K 4.7 4.3  --  CL 108 104  --   CO2 23 22  --   GLUCOSE 96 119*  --   BUN 34* 26*  --   CREATININE 1.19* 1.07* 1.33*  CALCIUM 9.2 8.5*  --   AST 20  --   --   ALT 14  --   --   ALKPHOS 66  --   --   BILITOT 0.8  --   --     Cardiac Enzymes No results for input(s): TROPONINI in the last 168 hours.  Microbiology Results  Results for orders placed or performed during the hospital encounter of 04/06/17  Culture, blood (routine x 2)     Status: None (Preliminary result)   Collection Time: 04/06/17 10:54 AM  Result Value Ref Range Status   Specimen Description   Final    BLOOD Blood Culture results may not be optimal due to an inadequate volume of blood received in culture bottles   Special Requests RIGHT ANTECUBITAL  Final   Culture   Final    NO GROWTH 4 DAYS Performed at [facility], [address] [place], [place] [address]    Report Status PENDING  Incomplete  Wound or Superficial Culture     Status: None   Collection Time: 04/06/17 12:59 PM  Result Value Ref Range Status   Specimen Description   Final    FOOT Performed at [facility], [address], [place], [place] [address]    Special Requests   Final    RIGHT Performed at [facility], [address] [place], [place] [address]    Gram Stain   Final    MODERATE WBC PRESENT,BOTH PMN AND  MONONUCLEAR NO ORGANISMS SEEN    Culture   Final    NO GROWTH 3 DAYS Performed at [facility], [place] [address], [place], [place] [address]    Report Status 04/09/2017 FINAL  Final  Aerobic/Anaerobic Culture (surgical/deep wound)     Status: None (Preliminary result)   Collection Time: 04/06/17  5:28 PM  Result Value Ref Range Status   Specimen Description   Final    FOOT Performed at [facility], [address], [place], [place] [address]    Special Requests   Final    LEFT Performed at [facility], [address] [place], [place] [address]    Gram Stain   Final    FEW WBC PRESENT, PREDOMINANTLY PMN NO ORGANISMS SEEN    Culture   Final    NO GROWTH 3 DAYS NO ANAEROBES ISOLATED; CULTURE IN PROGRESS FOR 5 DAYS Performed at [facility], [place] [address] [place], [place] [address]    Report Status PENDING  Incomplete  Aerobic/Anaerobic Culture (surgical/deep wound)     Status: None (Preliminary result)   Collection Time: 04/08/17  9:07 AM  Result Value Ref Range Status   Specimen Description   Final    ABSCESS Performed at [facility], [address] [place], [place] [address]    Special Requests   Final    ABSCESS RIGHT FOOT Performed at [facility], [address] [place], [place] [address]    Gram Stain   Final    MODERATE WBC PRESENT, PREDOMINANTLY PMN NO ORGANISMS SEEN    Culture   Final    NO GROWTH < 24 HOURS Performed at [facility], [place] [address] [place] [address], [place],  [address]    Report Status PENDING  Incomplete  RADIOLOGY:  Dg Chest Port 1 View  Result Date: 04/09/2017 CLINICAL DATA:  84 year old female status post PICC line placement. EXAM: PORTABLE CHEST 1 VIEW COMPARISON:  03/19/2017 and earlier. FINDINGS: Portable AP upright view at 2214 hrs. A right side PICC line catheter has been placed, but loops in the right subclavian region and is directed retrograde into the right axilla. The tip  is also looped onto itself in the axillary region. Stable lung volumes. Stable mediastinal contours. No acute pulmonary opacity. Paucity of bowel gas in the upper abdomen. IMPRESSION: 1. Right PICC line courses centrally to the right subclavian vein level, but then loops peripherally into the right axilla. This should be repositioned. 2.  No acute cardiopulmonary abnormality. Electronically Signed   By: Genevie Ann M.D.   On: 04/09/2017 22:32    EKG:   Orders placed or performed during the hospital encounter of 06/23/16  . ED EKG  . ED EKG  . EKG 12-Lead  . EKG 12-Lead  . EKG 12-Lead  . EKG 12-Lead      Management plans discussed with the patient, family and they are in agreement.  CODE STATUS:     Code Status Orders  (From admission, onward)        Start     Ordered   04/09/17 1406  Do not attempt resuscitation (DNR)  Continuous    Question Answer Comment  In the event of cardiac or respiratory ARREST Do not call a "code blue"   In the event of cardiac or respiratory ARREST Do not perform Intubation, CPR, defibrillation or ACLS   In the event of cardiac or respiratory ARREST Use medication by any route, position, wound care, and other measures to relive pain and suffering. May use oxygen, suction and manual treatment of airway obstruction as needed for comfort.      04/09/17 1406    Code Status History    Date Active Date Inactive Code Status Order ID Comments User Context   04/06/2017 15:49 04/09/2017 14:06 Full Code [id number]  Gorden Harms, MD Inpatient   03/26/2017 13:19 04/03/2017 20:42 DNR [id number]  Hillary Bow, MD ED   11/09/2015 13:04 11/10/2015 18:05 Full Code [id number]  Fritzi Mandes, MD Inpatient    Advance Directive Documentation     Most Recent Value  Type of Advance Directive  Healthcare Power of Attorney  Pre-existing out of facility DNR order (yellow form or pink MOST form)  No data  "MOST" Form in Place?  No data      TOTAL TIME TAKING CARE OF THIS  PATIENT: 45 minutes.    Avel Peace Gladyce Mcray M.D on 04/10/2017 at 10:53 AM  Between 7am to 6pm - Pager - [phone]  After 6pm go to [email or web address] - password [email or web address] [facility]  Office  [phone]  CC: Primary care physician; Idelle Crouch, MD   Note: This dictation was prepared with Dragon dictation along with smaller phrase technology. Any transcriptional errors that result from this process are unintentional.

## 2017-04-10 NOTE — Progress Notes (Signed)
ID E note Cxs remain neg. PICC placed.  Rec Cont ceftriaxone and oral flagyl as ordered Fu 2-3 weeks

## 2017-04-10 NOTE — Evaluation (Signed)
Physical Therapy Evaluation Patient Details Name: [name] [name] MRN: [id number] DOB: [birth year] Today's Date: 04/10/2017   History of Present Illness  84 y/o R foot cellulitis, s/p I&D with wound vac placement. Pt with history of OA, RA, pulmonary HTN, chest pain, GERD, HTN.  Clinical Impression  Pt showed good effort with PT exam and though she is hesitant to do a lot with the R foot she was able to do a few small shuffling side steps to the get to the recliner.  Per PT order pt can do transfers only with WBing evenly on each foot (PWB 50%) and pt was able to maintain this w/o excessive assist.  Pt pleasant t/o the session, eager to work with with PT but limited both per surgeon limitations and physical ability.  Pt will need STR to work back to baseline status.     Follow Up Recommendations SNF    Equipment Recommendations  None recommended by PT    Recommendations for Other Services       Precautions / Restrictions Precautions Precautions: Fall Precaution Comments: transfers only Restrictions Weight Bearing Restrictions: Yes RLE Weight Bearing: Partial weight bearing(per description in PT orders, 50%)      Mobility  Bed Mobility Overal bed mobility: Needs Assistance Bed Mobility: Supine to Sit     Supine to sit: Min assist     General bed mobility comments: Pt showed good effort in getting to EOB, needed only light assist, though she did require a lot of cuing/enoucragement  Transfers Overall transfer level: Needs assistance Equipment used: Rolling walker (2 wheeled) Transfers: Sit to/from Stand Sit to Stand: Min assist         General transfer comment: Pt able to shift weight forward and rise to standing with light assist, good use of UEs on rail then AD  Ambulation/Gait Ambulation/Gait assistance: Min assist Ambulation Distance (Feet): 3 Feet Assistive device: Rolling walker (2 wheeled)       General Gait Details: side shuffle step bed to recliner with  good weight distribution to the walker, appeared to maintain [place] well on R  Stairs            Wheelchair Mobility    Modified Rankin (Stroke Patients Only)       Balance Overall balance assessment: Needs assistance Sitting-balance support: Feet supported;No upper extremity supported Sitting balance-Leahy Scale: Good       Standing balance-Leahy Scale: Fair Standing balance comment: heavy reliance on the walker                             Pertinent Vitals/Pain Pain Assessment: 0-10 Pain Score: 3  Pain Location: R foot    Home Living Family/patient expects to be discharged to:: Skilled nursing facility Living Arrangements: Alone   Type of Home: House Home Access: Stairs to enter Entrance Stairs-Rails: Psychiatric nurse of Steps: 2 Home Layout: One level Home Equipment: Walker - 2 wheels;Bedside commode      Prior Function Level of Independence: Independent         Comments: previously independent, however recently needing assist for all mobility and using RW at SNF     Hand Dominance        Extremity/Trunk Assessment   Upper Extremity Assessment Upper Extremity Assessment: Generalized weakness;Overall [facility] for tasks assessed    Lower Extremity Assessment Lower Extremity Assessment: Generalized weakness;Overall [facility] for tasks assessed       Communication   Communication:  No difficulties  Cognition Arousal/Alertness: Awake/alert Behavior During Therapy: WFL for tasks assessed/performed Overall Cognitive Status: Within Functional Limits for tasks assessed                                        General Comments      Exercises     Assessment/Plan    PT Assessment Patient needs continued PT services  PT Problem List Decreased strength;Decreased activity tolerance;Decreased balance;Decreased mobility;Decreased knowledge of use of DME       PT Treatment Interventions DME instruction;Gait  training;Stair training;Functional mobility training;Neuromuscular re-education;Therapeutic exercise;Therapeutic activities;Balance training;Patient/family education    PT Goals (Current goals can be found in the Care Plan section)  Acute Rehab PT Goals Patient Stated Goal: To be stronger and walk better PT Goal Formulation: With patient Time For Goal Achievement: 04/24/17 Potential to Achieve Goals: Fair    Frequency Min 2X/week   Barriers to discharge        Co-evaluation               AM-PAC PT "6 Clicks" Daily Activity  Outcome Measure Difficulty turning over in bed (including adjusting bedclothes, sheets and blankets)?: A Little Difficulty moving from lying on back to sitting on the side of the bed? : Unable Difficulty sitting down on and standing up from a chair with arms (e.g., wheelchair, bedside commode, etc,.)?: Unable Help needed moving to and from a bed to chair (including a wheelchair)?: A Little Help needed walking in hospital room?: A Lot Help needed climbing 3-5 steps with a railing? : Total 6 Click Score: 11    End of Session Equipment Utilized During Treatment: Gait belt Activity Tolerance: Patient tolerated treatment well Patient left: in chair;with chair alarm set Nurse Communication: Mobility status PT Visit Diagnosis: Unsteadiness on feet (R26.81);Difficulty in walking, not elsewhere classified (R26.2);Muscle weakness (generalized) (M62.81)    Time: 1000-1029 PT Time Calculation (min) (ACUTE ONLY): 29 min   Charges:   PT Evaluation $PT Eval Low Complexity: 1 Low     PT G Codes:        Kreg Shropshire, DPT 04/10/2017, 12:30 PM

## 2017-04-10 NOTE — Progress Notes (Signed)
[facility] Podiat[facility]                                                      Patient Demographics  [name] [name], is a 84 y.o. female   MRN: [id number]   DOB - 1930-12-01  Admit Date - 04/06/2017    Outpatient Primary MD for the patient is Idelle Crouch, MD  Consult requested in the Hospital by Salary, Avel Peace, MD, On 04/10/2017     With History of -  Past Medical History:  Diagnosis Date  . breast cancer   . Fibrocystic breast disease    Severe; status post bilateral mastectomies and implants  . GERD (gastroesophageal reflux disease)    Severe; manifested vocal cord irritation, status-post Nissen fundcoplication in 05/971  . History of ataxia    with questionable right upper and lower  . History of breast cancer   . History of exertional chest pain    with her last stress echo in 09/1999 being normal  . History of migraine    vascular  . History of shingles   . History of syncope 12/1999   With negative cardiac workup to include an echocardiogram, which revealed only mild to moderate MR, a CT scan of the chest, which was negative, and a Cardiolite stress test, which was normal  . Hyperlipidemia, unspecified   . Hypertension    CTA 05/2004 showed no renovascular cause  . Mild dementia   . Muscle atrophy    Positive FANA and anti- SCL70 antibodies; S/p biopsy without eosinic fascitis; Remicade discontinued  . Osteoarthritis    a. Cervical spine. b. Lumbar spine/spinal stenosis. [facility])  . Osteoporosis    a. Fosamax b. Right wrist fracture. c. Sternal fracture ([place])  . Rheumatoid arthritis without elevated rheumatoid factor (HCC)    a. Methotrexate. b. S/p Remicade. c. Seronegative. d. S/p Plaquenil. [facility])      Past Surgical History:  Procedure Laterality Date  . APPENDECTOMY    . CHOLECYSTECTOMY    . IRRIGATION AND DEBRIDEMENT ABSCESS Right  04/08/2017   Procedure: IRRIGATION AND DEBRIDEMENT ABSCESS with wound vac;  Surgeon: Albertine Patricia, DPM;  Location: [facility] ORS;  Service: Podiatry;  Laterality: Right;  . LAPAROSCOPIC NISSEN FUNDOPLICATION  53/2992  . LUMBAR LAMINECTOMY     for spinal stenosis  . MASTECTOMY Bilateral    with implants  . ORIF DISTAL RADIUS FRACTURE Right 07/27/2008   with volar plate  . TOTAL ABDOMINAL HYSTERECTOMY W/ BILATERAL SALPINGOOPHORECTOMY    . VASCULAR SURGERY      in for   Chief Complaint  Patient presents with  . Cellulitis     HPI  [name] [name]  is a 84 y.o. female, 2 days status post I&D of infected hematoma from the dorsum of the right foot.  Wound VAC was placed and will be changed today.    Review of Systems    In addition to the HPI above,  No Fever-chills, No Headache, No changes with Vision or hearing, No problems swallowing food or Liquids, No Chest pain, Cough or Shortness of Breath, No Abdominal pain, No Nausea or Vommitting, Bowel movements are regular, No Blood in stool or Urine, No dysuria, No new skin rashes or bruises, No new joints pains-aches,  No new weakness, tingling, numbness in any extremity, No recent weight gain  or loss, No polyuria, polydypsia or polyphagia, No significant Mental Stressors.  A full 10 point Review of Systems was done, except as stated above, all other Review of Systems were negative.   Social History Social History   Tobacco Use  . Smoking status: Never Smoker  . Smokeless tobacco: Never Used  Substance Use Topics  . Alcohol use: No    Family History Family History  Problem Relation Age of Onset  . Heart failure Mother   . Hypertension Mother   . Cancer Father   . Heart attack Sister     Prior to Admission medications   Medication Sig Start Date End Date Taking? Authorizing Provider  acetaminophen (TYLENOL) 650 MG CR tablet Take 1,300 mg by mouth 2 (two) times daily.    Yes [provider]  amLODipine  (NORVASC) 5 MG tablet Take 10 mg by mouth daily. 2 tabs   Yes [provider]  Ascorbic Acid (VITAMIN C) 1000 MG tablet Take 1,000 mg by mouth daily.   Yes [provider]  aspirin EC 81 MG tablet Take 81 mg by mouth daily.   Yes [provider]  doxycycline (VIBRAMYCIN) 100 MG capsule Take 1 capsule by mouth 2 (two) times daily. 04/03/17  Yes [provider]  folic acid (FOLVITE) 1 MG tablet Take 1 mg by mouth daily.   Yes [provider]  isosorbide mononitrate (IMDUR) 30 MG 24 hr tablet Take 30 mg by mouth daily.    Yes [provider]  losartan (COZAAR) 100 MG tablet Take 100 mg by mouth daily.    Yes [provider]  meloxicam (MOBIC) 7.5 MG tablet Take 1 tablet (7.5 mg total) by mouth daily. 04/04/17  Yes Salary, Avel Peace, MD  methotrexate (RHEUMATREX) 2.5 MG tablet Take 6 tablets by mouth every Friday.  10/28/15  Yes [provider]  metoprolol (LOPRESSOR) 100 MG tablet Take 100 mg by mouth 2 (two) times daily.    Yes [provider]  mirtazapine (REMERON) 7.5 MG tablet Take 7.5 mg by mouth at bedtime.   Yes [provider]  Multiple Vitamin (MULTI-VITAMINS) TABS Take 1 tablet by mouth daily.   Yes [provider]  sertraline (ZOLOFT) 100 MG tablet Take 100 mg by mouth daily.   Yes [provider]  cefTRIAXone 1 g in sodium chloride 0.9 % 100 mL Inject 1 g into the vein daily. 04/11/17   Salary, Avel Peace, MD  docusate sodium (COLACE) 100 MG capsule Take 1 capsule (100 mg total) by mouth 2 (two) times daily. 04/10/17   Salary, Avel Peace, MD  oxyCODONE (OXY IR/ROXICODONE) 5 MG immediate release tablet Take 1 tablet (5 mg total) by mouth every 4 (four) hours as needed for severe pain. 04/10/17   Salary, Avel Peace, MD  traMADol (ULTRAM) 50 MG tablet Take 1 tablet (50 mg total) by mouth every 6 (six) hours as needed for moderate pain. 04/10/17   Salary, Avel Peace, MD    Anti-infectives (From  admission, onward)   Start     Dose/Rate Route Frequency Ordered Stop   04/11/17 0000  cefTRIAXone 1 g in sodium chloride 0.9 % 100 mL     1 g 200 mL/hr over 30 Minutes Intravenous Every 24 hours 04/10/17 1051     04/10/17 0000  vancomycin (VANCOCIN) IVPB 1000 mg/200 mL premix  Status:  Discontinued     1,000 mg 200 mL/hr over 60 Minutes Intravenous Every 24 hours 04/09/17 0356 04/09/17 1355  04/09/17 1800  vancomycin (VANCOCIN) IVPB 1000 mg/200 mL premix  Status:  Discontinued     1,000 mg 200 mL/hr over 60 Minutes Intravenous Every 24 hours 04/09/17 0354 04/09/17 0356   04/09/17 1000  cefTRIAXone (ROCEPHIN) 1 g in sodium chloride 0.9 % 100 mL IVPB     1 g 200 mL/hr over 30 Minutes Intravenous Every 24 hours 04/09/17 0905     04/07/17 1000  cefTRIAXone (ROCEPHIN) 1 g in dextrose 5 % 50 mL IVPB  Status:  Discontinued     1 g 100 mL/hr over 30 Minutes Intravenous Every 24 hours 04/06/17 1549 04/09/17 0905   04/07/17 0200  vancomycin (VANCOCIN) IVPB 750 mg/150 ml premix  Status:  Discontinued     750 mg 150 mL/hr over 60 Minutes Intravenous Every 24 hours 04/06/17 1636 04/09/17 0354   04/06/17 1245  vancomycin (VANCOCIN) IVPB 1000 mg/200 mL premix     1 g Intravenous  Once 04/06/17 1241 04/06/17 1352   04/06/17 1245  cefTRIAXone (ROCEPHIN) 1 g in dextrose 5 % 50 mL IVPB     1 g 100 mL/hr over 30 Minutes Intravenous  Once 04/06/17 1241 04/06/17 1352      Scheduled Meds: . amLODipine  10 mg Oral Daily  . aspirin EC  81 mg Oral Daily  . docusate sodium  100 mg Oral BID  . folic acid  1 mg Oral Daily  . heparin  5,000 Units Subcutaneous Q8H  . isosorbide mononitrate  30 mg Oral Daily  . losartan  100 mg Oral Daily  . meloxicam  7.5 mg Oral Daily  . metoprolol tartrate  100 mg Oral BID  . mirtazapine  7.5 mg Oral QHS  . multivitamin with minerals  1 tablet Oral Daily  . sertraline  100 mg Oral Daily  . sodium chloride flush  10-40 mL Intracatheter Q12H  . vitamin C  1,000 mg Oral  Daily   Continuous Infusions: . cefTRIAXone (ROCEPHIN)  IV Stopped (04/10/17 1022)   PRN Meds:.acetaminophen **OR** acetaminophen, bisacodyl, hydrALAZINE, ondansetron **OR** ondansetron (ZOFRAN) IV, oxyCODONE, polyethylene glycol, sodium chloride flush, sodium chloride flush, traMADol  Allergies  Allergen Reactions  . Atorvastatin Other (See Comments)    Myositis  . Bisphosphonates Other (See Comments)    GI upset  . Codeine Other (See Comments)  . Memantine Other (See Comments)  . Omeprazole Nausea Only  . Other Other (See Comments)    Leg cramps GI upset  . Salsalate Other (See Comments)  . Sulfa Antibiotics     Pt doesn't remember  . Penicillins Itching    Has patient had a PCN reaction causing immediate rash, facial/tongue/throat swelling, SOB or lightheadedness with hypotension: Yes Has patient had a PCN reaction causing severe rash involving mucus membranes or skin necrosis: No Has patient had a PCN reaction that required hospitalization: No Has patient had a PCN reaction occurring within the last 10 years: No If all of the above answers are "NO", then may proceed with Cephalosporin use.    Physical Exam: Patient is alert and very pleasant today.  Vitals  Blood pressure (!) 158/62, pulse 67, temperature 99 F (37.2 C), temperature source Oral, resp. rate 18, height 5\' 3"  (1.6 m), weight 65.8 kg (145 lb), SpO2 96 %.  Lower Extremity exam: Wound VAC is removed overall I think the wound looks much improved.  The cellulitis is vastly reduced.  There is beginning granulation tissue throughout the wound.  A couple of extensor tendons are still  exposed but they appear to be starting to granulate over those regions.  Only a couple of small areas of necrosis as able to remove those debridement.  Data Review  CBC Recent Labs  Lab 04/06/17 0929 04/07/17 0400  WBC 16.4* 15.4*  HGB 11.6* 10.9*  HCT 34.9* 33.7*  PLT 345 349  MCV 117.6* 117.5*  MCH 39.0* 38.1*  MCHC 33.2  32.5  RDW 16.4* 16.2*  LYMPHSABS 0.8*  --   MONOABS 2.5*  --   EOSABS 0.2  --   BASOSABS 0.2*  --    ------------------------------------------------------------------------------------------------------------------  Chemistries  Recent Labs  Lab 04/06/17 0929 04/07/17 0400 04/09/17 0108  NA 141 138  --   K 4.7 4.3  --   CL 108 104  --   CO2 23 22  --   GLUCOSE 96 119*  --   BUN 34* 26*  --   CREATININE 1.19* 1.07* 1.33*  CALCIUM 9.2 8.5*  --   AST 20  --   --   ALT 14  --   --   ALKPHOS 66  --   --   BILITOT 0.8  --   --    ----------------------------------------------------------------------------------------- Imaging results:   Dg Chest Port 1 View  Result Date: 04/09/2017 CLINICAL DATA:  84 year old female status post PICC line placement. EXAM: PORTABLE CHEST 1 VIEW COMPARISON:  03/19/2017 and earlier. FINDINGS: Portable AP upright view at 2214 hrs. A right side PICC line catheter has been placed, but loops in the right subclavian region and is directed retrograde into the right axilla. The tip is also looped onto itself in the axillary region. Stable lung volumes. Stable mediastinal contours. No acute pulmonary opacity. Paucity of bowel gas in the upper abdomen. IMPRESSION: 1. Right PICC line courses centrally to the right subclavian vein level, but then loops peripherally into the right axilla. This should be repositioned. 2.  No acute cardiopulmonary abnormality. Electronically Signed   By: Genevie Ann M.D.   On: 04/09/2017 22:32    Assessment & Plan: Overall I am pleased with the progress the wound looks much more stable cellulitis is improved no evidence of purulent drainage.  As noted earlier couple small areas necrosis removed just with simple debridement with gauze and saline and was able to pull that the tissue away.  I reapplied the wound VAC today set at 125 continuous.  She is going be transferred to [facility] and they will continue with the wound VAC changing it every  Monday Wednesday and Friday.  I will see her in 2 weeks.  She can do some simple weightbearing and transfer but I would not recommend she get up and do a lot of walking at this juncture.  Like see her in 2 weeks in my office IV antibiotics to be continued for now.  Please have [place] call me if there is any questions regarding wound care.  Active Problems:   Foot abscess   Family Communication: Plan discussed with patient  Albertine Patricia M.D on 04/10/2017 at [place] PM  Thank you for the consult, we will follow the patient with you in the Hospital.

## 2017-04-10 NOTE — Progress Notes (Signed)
Report called to Anderson Malta, LPN at [facility]. EMS called and patient awaiting transport.

## 2017-04-11 ENCOUNTER — Non-Acute Institutional Stay (SKILLED_NURSING_FACILITY): Payer: PPO | Admitting: Gerontology

## 2017-04-11 DIAGNOSIS — L03115 Cellulitis of right lower limb: Secondary | ICD-10-CM | POA: Diagnosis not present

## 2017-04-11 DIAGNOSIS — S82891D Other fracture of right lower leg, subsequent encounter for closed fracture with routine healing: Secondary | ICD-10-CM | POA: Diagnosis not present

## 2017-04-11 LAB — CULTURE, BLOOD (ROUTINE X 2): Culture: NO GROWTH

## 2017-04-11 LAB — AEROBIC/ANAEROBIC CULTURE (SURGICAL/DEEP WOUND)

## 2017-04-11 LAB — AEROBIC/ANAEROBIC CULTURE W GRAM STAIN (SURGICAL/DEEP WOUND): Culture: NO GROWTH

## 2017-04-12 ENCOUNTER — Other Ambulatory Visit
Admission: RE | Admit: 2017-04-12 | Discharge: 2017-04-12 | Disposition: A | Discharge status: Home / Self Care | Payer: PPO | Source: Ambulatory Visit | Attending: Internal Medicine | Admitting: Internal Medicine

## 2017-04-12 ENCOUNTER — Encounter: Payer: Self-pay | Admitting: Gerontology

## 2017-04-12 ENCOUNTER — Other Ambulatory Visit: Payer: Self-pay

## 2017-04-12 DIAGNOSIS — L03115 Cellulitis of right lower limb: Secondary | ICD-10-CM | POA: Insufficient documentation

## 2017-04-12 DIAGNOSIS — I10 Essential (primary) hypertension: Secondary | ICD-10-CM | POA: Diagnosis not present

## 2017-04-12 DIAGNOSIS — M0600 Rheumatoid arthritis without rheumatoid factor, unspecified site: Secondary | ICD-10-CM | POA: Diagnosis not present

## 2017-04-12 LAB — COMPREHENSIVE METABOLIC PANEL WITH GFR
ALT: 17 U/L (ref 14–54)
AST: 24 U/L (ref 15–41)
Albumin: 3.3 g/dL — ABNORMAL LOW (ref 3.5–5.0)
Alkaline Phosphatase: 68 U/L (ref 38–126)
Anion gap: 14 (ref 5–15)
BUN: 19 mg/dL (ref 6–20)
CO2: 22 mmol/L (ref 22–32)
Calcium: 9.3 mg/dL (ref 8.9–10.3)
Chloride: 105 mmol/L (ref 101–111)
Creatinine, Ser: 0.92 mg/dL (ref 0.44–1.00)
GFR calc Af Amer: >60 mL/min
GFR calc non Af Amer: 56 mL/min — ABNORMAL LOW
Glucose, Bld: 142 mg/dL — ABNORMAL HIGH (ref 65–99)
Potassium: 3.4 mmol/L — ABNORMAL LOW (ref 3.5–5.1)
Sodium: 141 mmol/L (ref 135–145)
Total Bilirubin: 0.6 mg/dL (ref 0.3–1.2)
Total Protein: 6.9 g/dL (ref 6.5–8.1)

## 2017-04-12 LAB — CBC WITH DIFFERENTIAL/PLATELET
Basophils Absolute: 0.1 10*3/uL (ref 0–0.1)
Basophils Relative: 1 %
Eosinophils Absolute: 0.3 10*3/uL (ref 0–0.7)
Eosinophils Relative: 2 %
HCT: 35.6 % (ref 35.0–47.0)
Hemoglobin: 11.5 g/dL — ABNORMAL LOW (ref 12.0–16.0)
Lymphocytes Relative: 7 %
Lymphs Abs: 1 10*3/uL (ref 1.0–3.6)
MCH: 37.4 pg — ABNORMAL HIGH (ref 26.0–34.0)
MCHC: 32.2 g/dL (ref 32.0–36.0)
MCV: 116.2 fL — ABNORMAL HIGH (ref 80.0–100.0)
Monocytes Absolute: 1.3 10*3/uL — ABNORMAL HIGH (ref 0.2–0.9)
Monocytes Relative: 10 %
Neutro Abs: 11 10*3/uL — ABNORMAL HIGH (ref 1.4–6.5)
Neutrophils Relative %: 80 %
Platelets: 366 10*3/uL (ref 150–440)
RBC: 3.07 MIL/uL — ABNORMAL LOW (ref 3.80–5.20)
RDW: 16.3 % — ABNORMAL HIGH (ref 11.5–14.5)
WBC: 13.7 10*3/uL — ABNORMAL HIGH (ref 3.6–11.0)

## 2017-04-12 LAB — C-REACTIVE PROTEIN: CRP: 6.3 mg/dL — AB (ref <1.0)

## 2017-04-12 MED ORDER — OXYCODONE HCL 5 MG PO TABS: 5.0000 mg | ORAL_TABLET | ORAL | 0 refills | Status: DC | PRN

## 2017-04-12 MED ORDER — TRAMADOL HCL 50 MG PO TABS: 50.0000 mg | ORAL_TABLET | Freq: Four times a day (QID) | ORAL | 0 refills | Status: DC | PRN

## 2017-04-12 NOTE — Telephone Encounter (Signed)
Rx sent to [facility] phone : [phone] , fax : [phone]  

## 2017-04-12 NOTE — Progress Notes (Signed)
Location:   [facility] Room Number: [place] of Service:  SNF [phone]) Provider:  Toni Arthurs, NP-C  Sparks, [name], MD  [name], MD as PCP - General (Internal Medicine)  Extended Emergency Contact Information Primary Emergency Contact: [name],[name] Address: [place] Phone: [phone] Work Phone: [phone] Relation: Sister Secondary Emergency Contact: [place] Mobile Phone: [phone] Relation: Niece  Code Status:  DNR Goals of care: Advanced Directive information Advanced Directives 04/12/2017  Does Patient Have a Medical Advance Directive? Yes  Type of Paramedic of [place];Out of facility DNR (pink MOST or yellow form)  Does patient want to make changes to medical advance directive? No - Patient declined  Copy of [place] in Chart? Yes  Would patient like information on creating a medical advance directive? -     Chief Complaint  Patient presents with  . Acute Visit    Check wound vac    HPI:  Pt is a 84 y.o. female seen today for an acute visit for evaluation of cellulitis and Re-application of wound vac. Pt was readmitted to the facility yesterday after another admission to [facility] for wound bebridment/ I&D of abscess. Pt removed woundvac during the night.  Area still painful, but seems more tolerable to touch. Area still red, warm to touch. No purulent drainage. In general, pt reports pain is improved. Reports pain as a 2/10 when area is not touched. Pt verbalizes understanding of purpose of woundvac. Tolerated application well. Pt continues to participate in PT/Ot as tolerated. VSS. No other complaints.    Please note pt with limited verbal ability. Unable to obtain complete ROS. Some ROS info obtained from staff and documentation.   Past Medical History:  Diagnosis Date  . breast cancer   .  Fibrocystic breast disease    Severe; status post bilateral mastectomies and implants  . GERD (gastroesophageal reflux disease)    Severe; manifested vocal cord irritation, status-post Nissen fundcoplication in 04/8754  . History of ataxia    with questionable right upper and lower  . History of breast cancer   . History of exertional chest pain    with her last stress echo in 09/1999 being normal  . History of migraine    vascular  . History of shingles   . History of syncope 12/1999   With negative cardiac workup to include an echocardiogram, which revealed only mild to moderate MR, a CT scan of the chest, which was negative, and a Cardiolite stress test, which was normal  . Hyperlipidemia, unspecified   . Hypertension    CTA 05/2004 showed no renovascular cause  . Mild dementia   . Muscle atrophy    Positive FANA and anti- SCL70 antibodies; S/p biopsy without eosinic fascitis; Remicade discontinued  . Osteoarthritis    a. Cervical spine. b. Lumbar spine/spinal stenosis. [facility])  . Osteoporosis    a. Fosamax b. Right wrist fracture. c. Sternal fracture ([place])  . Rheumatoid arthritis without elevated rheumatoid factor (HCC)    a. Methotrexate. b. S/p Remicade. c. Seronegative. d. S/p Plaquenil. [facility])   Past Surgical History:  Procedure Laterality Date  . APPENDECTOMY    . CHOLECYSTECTOMY    . IRRIGATION AND DEBRIDEMENT ABSCESS Right 04/08/2017   Procedure: IRRIGATION AND DEBRIDEMENT ABSCESS with wound vac;  Surgeon: Albertine Patricia, DPM;  Location: [facility] ORS;  Service: Podiatry;  Laterality: Right;  . LAPAROSCOPIC NISSEN FUNDOPLICATION  73/5329  . LUMBAR LAMINECTOMY     for spinal stenosis  . MASTECTOMY Bilateral    with implants  . ORIF DISTAL RADIUS FRACTURE Right 07/27/2008   with volar plate  . TOTAL ABDOMINAL HYSTERECTOMY W/ BILATERAL SALPINGOOPHORECTOMY    . VASCULAR SURGERY      Allergies  Allergen Reactions  . Atorvastatin Other (See Comments)    Myositis  .  Bisphosphonates Other (See Comments)    GI upset  . Codeine Other (See Comments)  . Memantine Other (See Comments)  . Omeprazole Nausea Only  . Other Other (See Comments)    Leg cramps GI upset  . Salsalate Other (See Comments)  . Sulfa Antibiotics     Pt doesn't remember  . Penicillins Itching    Has patient had a PCN reaction causing immediate rash, facial/tongue/throat swelling, SOB or lightheadedness with hypotension: Yes Has patient had a PCN reaction causing severe rash involving mucus membranes or skin necrosis: No Has patient had a PCN reaction that required hospitalization: No Has patient had a PCN reaction occurring within the last 10 years: No If all of the above answers are "NO", then may proceed with Cephalosporin use.    Allergies as of 04/11/2017      Reactions   Atorvastatin Other (See Comments)   Myositis   Bisphosphonates Other (See Comments)   GI upset   Codeine Other (See Comments)   Memantine Other (See Comments)   Omeprazole Nausea Only   Other Other (See Comments)   Leg cramps GI upset   Salsalate Other (See Comments)   Sulfa Antibiotics    Pt doesn't remember   Penicillins Itching   Has patient had a PCN reaction causing immediate rash, facial/tongue/throat swelling, SOB or lightheadedness with hypotension: Yes Has patient had a PCN reaction causing severe rash involving mucus membranes or skin necrosis: No Has patient had a PCN reaction that required hospitalization: No Has patient had a PCN reaction occurring within the last 10 years: No If all of the above answers are "NO", then may proceed with Cephalosporin use.      Medication List        Accurate as of 04/11/17 11:59 PM. Always use your most recent med list.          acetaminophen 650 MG CR tablet Commonly known as:  TYLENOL Take 1,300 mg by mouth 2 (two) times daily.   amLODipine 10 MG tablet Commonly known as:  NORVASC Take 10 mg by mouth daily.   aspirin EC 81 MG tablet Take  81 mg by mouth daily.   cefTRIAXone 1 g in sodium chloride 0.9 % 100 mL Inject 1 g into the vein daily.   docusate sodium 100 MG capsule Commonly known as:  COLACE Take 1 capsule (100 mg total) by mouth 2 (two) times daily.   folic acid 1 MG tablet Commonly known as:  FOLVITE Take 1 mg by mouth daily.   isosorbide mononitrate 30 MG 24 hr tablet Commonly known as:  IMDUR Take 30 mg by mouth daily.   losartan 100 MG tablet Commonly known as:  COZAAR Take 100 mg by mouth daily.   methotrexate 2.5 MG tablet Commonly known as:  RHEUMATREX Take 6 tablets by mouth every Friday.   metoprolol tartrate 100 MG tablet Commonly known as:  LOPRESSOR Take 100 mg by mouth 2 (two) times daily.   mirtazapine 7.5 MG tablet Commonly known as:  REMERON Take 7.5 mg by mouth at bedtime.   MULTI-VITAMINS Tabs Take 1 tablet by mouth daily.   oxyCODONE 5 MG immediate release tablet Commonly known as:  Oxy IR/ROXICODONE Take 1 tablet (5 mg total) by mouth every 4 (four) hours as needed for severe pain.   sertraline 100 MG tablet Commonly known as:  ZOLOFT Take 100 mg by mouth daily.   traMADol 50 MG tablet Commonly known as:  ULTRAM Take 1 tablet (50 mg total) by mouth every 6 (six) hours as needed for moderate pain.   vitamin C 1000 MG tablet Take 1,000 mg by mouth daily.       Review of Systems  Unable to perform ROS: Dementia  Constitutional: Negative for activity change, appetite change, chills, diaphoresis and fever.  HENT: Negative for congestion, mouth sores, nosebleeds, postnasal drip, sneezing, sore throat, trouble swallowing and voice change.   Respiratory: Negative for apnea, cough, choking, chest tightness, shortness of breath and wheezing.   Cardiovascular: Negative for chest pain, palpitations and leg swelling.  Gastrointestinal: Negative for abdominal distention, abdominal pain, constipation, diarrhea and nausea.  Genitourinary: Negative for difficulty urinating,  dysuria, frequency and urgency.  Musculoskeletal: Positive for arthralgias (typical arthritis) and myalgias. Negative for back pain and gait problem.  Skin: Positive for wound. Negative for color change, pallor and rash.  Neurological: Negative for dizziness, tremors, syncope, speech difficulty, weakness, numbness and headaches.  Psychiatric/Behavioral: Negative for agitation and behavioral problems.  All other systems reviewed and are negative.   Immunization History  Administered Date(s) Administered  . Influenza-Unspecified 11/27/2013  . Pneumococcal Polysaccharide-23 03/27/2013   Pertinent  Health Maintenance Due  Topic Date Due  . DEXA SCAN  04/28/1997  . PNA vac Low Risk Adult (2 of 2 - PCV13) 03/27/2014  . INFLUENZA VACCINE  09/27/2016   No flowsheet data found. Functional Status Survey:    Vitals:   04/11/17 0943  BP: (!) 149/69  Pulse: 80  Resp: 18  Temp: 99.9 F (37.7 C)  TempSrc: Oral  SpO2: 97%  Weight: 144 lb 14.4 oz (65.7 kg)  Height: 5' 3"  (1.6 m)   Body mass index is 25.67 kg/m. Physical Exam  Constitutional: Vital signs are normal. She appears well-developed and well-nourished. She is active and cooperative. She does not appear ill. No distress.  HENT:  Head: Normocephalic and atraumatic.  Mouth/Throat: Uvula is midline, oropharynx is clear and moist and mucous membranes are normal. Mucous membranes are not pale, not dry and not cyanotic.  Eyes: Pupils are equal, round, and reactive to light. Conjunctivae, EOM and lids are normal.  Neck: Trachea normal, normal range of motion and full passive range of motion without pain. Neck supple. No JVD present. No tracheal deviation, no edema and no erythema present. No thyromegaly present.  Cardiovascular: Normal rate, regular rhythm, normal heart sounds, intact distal pulses and normal pulses. Exam reveals no gallop, no distant heart sounds and no friction rub.  No murmur heard. Pulses:      Dorsalis pedis  pulses are 2+ on the right side, and 2+ on the left side.  No edema  Pulmonary/Chest: Effort normal and breath sounds normal. No accessory muscle usage. No respiratory distress. She has no decreased breath sounds. She has no wheezes. She has no rhonchi. She has no rales. She exhibits no tenderness.  Abdominal: Soft. Normal appearance and bowel sounds are normal. She exhibits no distension and no ascites. There is no tenderness.  Musculoskeletal: She exhibits no edema.  Right ankle: She exhibits decreased range of motion, swelling and laceration. Tenderness.  Expected osteoarthritis, stiffness; Bilateral Calves soft, supple. Negative Homan's Sign. B- pedal pulses equal; Right ankle fracture with wound  Neurological: She has normal strength. Coordination and gait abnormal.  Skin: Skin is warm, dry and intact. Ecchymosis noted. She is not diaphoretic. No cyanosis. No pallor. Nails show no clubbing.  Right foot wound (I&D of skin abscess) with woundvac  Psychiatric: Her speech is normal and behavior is normal. Judgment and thought content normal. Her mood appears anxious. Cognition and memory are impaired.  Nursing note and vitals reviewed.   Labs reviewed: Recent Labs    03/31/17 0324  04/06/17 0929 04/07/17 0400 04/09/17 0108  NA 137  --  141 138  --   K 3.3*  --  4.7 4.3  --   CL 104  --  108 104  --   CO2 24  --  23 22  --   GLUCOSE 105*  --  96 119*  --   BUN 19  --  34* 26*  --   CREATININE 0.63   < > 1.19* 1.07* 1.33*  CALCIUM 8.3*  --  9.2 8.5*  --    < > = values in this interval not displayed.   Recent Labs    06/23/16 1807 03/22/17 0600 04/06/17 0929  AST 25 27 20   ALT 14 17 14   ALKPHOS 66 63 66  BILITOT 0.3 1.1 0.8  PROT 6.4* 6.8 6.9  ALBUMIN 3.3* 3.7 3.3*   Recent Labs    03/26/17 0600  03/31/17 0324 04/06/17 0929 04/07/17 0400  WBC 10.9   < > 13.5* 16.4* 15.4*  NEUTROABS 7.6*  --  9.7* 12.7*  --   HGB 12.2   < > 11.8* 11.6* 10.9*  HCT 36.8   < > 35.8  34.9* 33.7*  MCV 119.3*   < > 117.5* 117.6* 117.5*  PLT 267   < > 345 345 349   < > = values in this interval not displayed.   Lab Results  Component Value Date   TSH 1.412 06/23/2016   No results found for: HGBA1C No results found for: CHOL, HDL, LDLCALC, LDLDIRECT, TRIG, CHOLHDL  Significant Diagnostic Results in last 30 days:  Dg Chest 2 View  Result Date: 03/19/2017 CLINICAL DATA:  Elevated white cell count. History of breast cancer. EXAM: CHEST  2 VIEW COMPARISON:  Chest and CT chest 06/23/2016 FINDINGS: Heart size and pulmonary vascularity are normal probable emphysematous changes in the lungs. Peribronchial thickening consistent with chronic bronchitis. No airspace disease or consolidation. No blunting of costophrenic angles. No pneumothorax. Mediastinal contours appear intact. Calcification of the aorta. Degenerative changes in the spine. IMPRESSION: Mild emphysematous and chronic bronchitic changes in the lungs. No evidence of active pulmonary disease. Aortic atherosclerosis. Electronically Signed   By: Lucienne Capers M.D.   On: 03/19/2017 00:59   Dg Shoulder Right  Result Date: 03/18/2017 CLINICAL DATA:  [place] off of bus EXAM: RIGHT SHOULDER - 2+ VIEW COMPARISON:  None. FINDINGS: No fracture or malalignment. AC joint degenerative change. Mild glenohumeral degenerative change. Right lung apex is clear IMPRESSION: No acute osseous abnormality Electronically Signed   By: Donavan Foil M.D.   On: 03/18/2017 19:47   Dg Wrist Complete Right  Result Date: 03/18/2017 CLINICAL DATA:  [place] off bus, wrist deformity EXAM: RIGHT WRIST - COMPLETE 3+ VIEW COMPARISON:  None. FINDINGS: No acute fracture or malalignment. Patient is status post prior  surgical plate and multiple screw fixation of the distal radius for old fracture. Marked arthritis at the first [facility] joint and at the distal scaphoid. No radiopaque foreign body. IMPRESSION: Postsurgical changes of the distal radius. No definite acute  osseous abnormality Electronically Signed   By: Donavan Foil M.D.   On: 03/18/2017 19:40   Dg Ankle 2 Views Right  Result Date: 03/18/2017 CLINICAL DATA:  [place] off of bus EXAM: RIGHT ANKLE - 2 VIEW COMPARISON:  None. FINDINGS: No fracture or malalignment. Dorsal spurs off the navicular and talus. Ankle mortise is symmetric. Gas in the dorsal soft tissues of the mid to distal foot IMPRESSION: 1. No acute osseous abnormality 2. Gas in the dorsal soft tissues of the mid to distal foot Electronically Signed   By: Donavan Foil M.D.   On: 03/18/2017 19:46   Ct Head Wo Contrast  Result Date: 03/18/2017 CLINICAL DATA:  Follow-up off of bus.  Neck pain. EXAM: CT HEAD WITHOUT CONTRAST CT CERVICAL SPINE WITHOUT CONTRAST TECHNIQUE: Multidetector CT imaging of the head and cervical spine was performed following the standard protocol without intravenous contrast. Multiplanar CT image reconstructions of the cervical spine were also generated. COMPARISON:  Head CT 11/26/2013 FINDINGS: CT HEAD FINDINGS Brain: No intracranial hemorrhage. No parenchymal contusion. No midline shift or mass effect. Basilar cisterns are patent. No skull base fracture. No fluid in the paranasal sinuses or mastoid air cells. Orbits are normal. There are periventricular and subcortical white matter hypodensities. Generalized cortical atrophy. Vascular: No hyperdense vessel or unexpected calcification. Skull: No skull fracture. Small scalp hematoma over the RIGHT frontal bone. The Sinuses/Orbits: No acute finding. Other: None CT CERVICAL SPINE FINDINGS Alignment: Normal alignment of the cervical vertebral bodies. Skull base and vertebrae: Normal craniocervical junction. No loss of vertebral body height or disc height. Normal facet articulation. No evidence of fracture. Soft tissues and spinal canal: No prevertebral soft tissue swelling. No perispinal or epidural hematoma. Disc levels:  Endplate spurring and disc space narrowing from C5-C7. Upper  chest: Clear Other: None IMPRESSION: 1. No intracranial trauma. 2. Small RIGHT frontal scalp hematoma without fracture. 3. Chronic atrophy and white matter microvascular disease. 4. No cervical spine fracture. 5. Mild multilevel disc osteophytic disease. Electronically Signed   By: Suzy Bouchard M.D.   On: 03/18/2017 19:26   Ct Cervical Spine Wo Contrast  Result Date: 03/18/2017 CLINICAL DATA:  Follow-up off of bus.  Neck pain. EXAM: CT HEAD WITHOUT CONTRAST CT CERVICAL SPINE WITHOUT CONTRAST TECHNIQUE: Multidetector CT imaging of the head and cervical spine was performed following the standard protocol without intravenous contrast. Multiplanar CT image reconstructions of the cervical spine were also generated. COMPARISON:  Head CT 11/26/2013 FINDINGS: CT HEAD FINDINGS Brain: No intracranial hemorrhage. No parenchymal contusion. No midline shift or mass effect. Basilar cisterns are patent. No skull base fracture. No fluid in the paranasal sinuses or mastoid air cells. Orbits are normal. There are periventricular and subcortical white matter hypodensities. Generalized cortical atrophy. Vascular: No hyperdense vessel or unexpected calcification. Skull: No skull fracture. Small scalp hematoma over the RIGHT frontal bone. The Sinuses/Orbits: No acute finding. Other: None CT CERVICAL SPINE FINDINGS Alignment: Normal alignment of the cervical vertebral bodies. Skull base and vertebrae: Normal craniocervical junction. No loss of vertebral body height or disc height. Normal facet articulation. No evidence of fracture. Soft tissues and spinal canal: No prevertebral soft tissue swelling. No perispinal or epidural hematoma. Disc levels:  Endplate spurring and disc space narrowing from C5-C7. Upper  chest: Clear Other: None IMPRESSION: 1. No intracranial trauma. 2. Small RIGHT frontal scalp hematoma without fracture. 3. Chronic atrophy and white matter microvascular disease. 4. No cervical spine fracture. 5. Mild  multilevel disc osteophytic disease. Electronically Signed   By: Suzy Bouchard M.D.   On: 03/18/2017 19:26   Dg Hand 2 View Right  Result Date: 03/18/2017 CLINICAL DATA:  [place] off bus with wrist deformity EXAM: RIGHT HAND - 2 VIEW COMPARISON:  None. FINDINGS: No fracture is seen. Radial deviation at the second DIP joint. Arthritis second through fifth DIP joints with small suspected erosions at the heads of the second third and fourth middle phalanges. Mild arthritis at the second third and fourth PIP joints. Moderate arthritis at the base of the first [facility] joint and radial aspect of the wrist. IMPRESSION: 1. No acute fracture is seen 2. Arthritis at the DIP and PIP joints as well as the radial aspect of the wrist; small erosive changes are suspected, query inflammatory arthritis Electronically Signed   By: Donavan Foil M.D.   On: 03/18/2017 19:42   Mr Foot Right Wo Contrast  Result Date: 03/30/2017 CLINICAL DATA:  Cellulitis of the toe.  Pain. EXAM: MRI OF THE RIGHT FOREFOOT WITHOUT CONTRAST TECHNIQUE: Multiplanar, multisequence MR imaging of the right forefoot was performed. No intravenous contrast was administered. COMPARISON:  None. FINDINGS: Bones/Joint/Cartilage Nondisplaced fracture at the lateral base of the first proximal phalanx with mild surrounding marrow edema. Nondisplaced oblique fracture of the distal shaft of the first metatarsal with surrounding marrow edema. Nondisplaced fracture of the second metatarsal neck with marrow edema in the second metatarsal shaft and second metatarsal head. Nondisplaced fracture of third metatarsal head. Severe marrow edema with a linear component involving the distal anterior calcaneus adjacent to the calcaneocuboid joint partially visualized most consistent with a nondisplaced fracture. No periosteal reaction or bone destruction. Normal alignment. No joint effusion. Ligaments Collateral ligaments are intact.  Lisfranc ligament is intact Muscles and Tendons  Flexor, peroneal and extensor compartment tendons are intact. Muscles are normal. Soft tissue No fluid collection or hematoma. No soft tissue mass. Soft tissue edema along the dorsal aspect of the foot which may be reactive secondary to venous insufficiency versus cellulitis. IMPRESSION: 1. Nondisplaced fracture at the lateral base of the first proximal phalanx with mild surrounding marrow edema. 2. Nondisplaced oblique fracture of the distal shaft of the first metatarsal with surrounding marrow edema. 3. Nondisplaced fracture of the second metatarsal neck with marrow edema in the second metatarsal shaft and second metatarsal head. 4. Nondisplaced fracture of third metatarsal head. 5. Nondisplaced fracture of the distal anterior calcaneus at the calcaneocuboid joint. 6. No evidence of osteomyelitis. Electronically Signed   By: Kathreen Devoid   On: 03/30/2017 16:29   Mr Foot Right W Wo Contrast  Result Date: 04/07/2017 CLINICAL DATA:  Right foot cellulitis. Evaluate for abscess or osteomyelitis. EXAM: MRI OF THE RIGHT FOREFOOT WITHOUT AND WITH CONTRAST TECHNIQUE: Multiplanar, multisequence MR imaging of the right forefoot was performed before and after the administration of intravenous contrast. CONTRAST:  49m MULTIHANCE GADOBENATE DIMEGLUMINE 529 MG/ML IV SOLN COMPARISON:  Right foot x-rays dated April 06, 2017. Right foot MRI dated March 30, 2017. FINDINGS: Bones/Joint/Cartilage Unchanged nondisplaced fracture at the lateral base of the first proximal phalanx. Unchanged nondisplaced fracture of the distal shaft of the first metatarsal, second metatarsal neck and third metatarsal head. Unchanged nondisplaced fracture of the anterior calcaneus adjacent to the calcaneocuboid joint. No periosteal reaction or cortical  destruction. Normal alignment. No joint effusion. Ligaments Collateral ligaments are intact.  Lisfranc ligament is intact. Muscles and Tendons Flexor, peroneal and extensor compartment tendons are  intact. Mild increased STIR signal within the intrinsic muscles of the forefoot, likely related to diabetic muscle changes. Soft tissue New ill-defined, rim enhancing fluid collection over the dorsum of the lateral foot, extending from the MTP joints to the midfoot. The fluid collection measures approximately 8.6 x 2.3 x 1.6 cm (AP by transverse by CC). This communicates with the overlying skin surface. No soft tissue mass. IMPRESSION: 1. Ill-defined, rim enhancing 8.6 cm fluid collection over the dorsum of the lateral foot, extending from the MTP joints to the midfoot, consistent with abscess. This appears to drain to the overlying skin surface. 2. No evidence of osteomyelitis. 3. Unchanged nondisplaced fractures of the first proximal phalanx, distal first metatarsal shaft, second metatarsal neck, third metatarsal head, and distal anterior calcaneus near the calcaneocuboid joint. Electronically Signed   By: Titus Dubin M.D.   On: 04/07/2017 10:47   Dg Chest Port 1 View  Result Date: 04/09/2017 CLINICAL DATA:  84 year old female status post PICC line placement. EXAM: PORTABLE CHEST 1 VIEW COMPARISON:  03/19/2017 and earlier. FINDINGS: Portable AP upright view at 2214 hrs. A right side PICC line catheter has been placed, but loops in the right subclavian region and is directed retrograde into the right axilla. The tip is also looped onto itself in the axillary region. Stable lung volumes. Stable mediastinal contours. No acute pulmonary opacity. Paucity of bowel gas in the upper abdomen. IMPRESSION: 1. Right PICC line courses centrally to the right subclavian vein level, but then loops peripherally into the right axilla. This should be repositioned. 2.  No acute cardiopulmonary abnormality. Electronically Signed   By: Genevie Ann M.D.   On: 04/09/2017 22:32   Dg Foot 2 Views Right  Result Date: 03/26/2017 CLINICAL DATA:  History of a fall 8 days ago resulting in lacerations over the dorsum of the foot  requiring stitches. Patient also had an acute fracture of the base of the proximal phalanx of the great toe. The patient now is complaining of increased foot pain, erythema, and swelling. Clinical suspicion of cellulitis. EXAM: RIGHT FOOT - 2 VIEW COMPARISON:  Right foot series of March 18, 2017 FINDINGS: The displaced fracture through the lateral aspect of the base of the fifth metatarsal is again demonstrated and appears stable. The second metatarsal neck fracture is not well demonstrated. The other phalanges and metatarsals are intact. The tarsal bones exhibit no acute abnormalities. There is mild soft tissue swelling over the forefoot and midfoot. No soft tissue gas collections or foreign bodies are observed. IMPRESSION: Mild soft tissue swelling may reflect cellulitis. No plain film evidence of osteomyelitis. There are post traumatic changes of the base of the proximal phalanx of the great toe and possibly of the neck of the second metatarsal as described. Electronically Signed   By: David  Martinique M.D.   On: 03/26/2017 12:37   Dg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

## 2017-04-13 ENCOUNTER — Encounter: Payer: Self-pay | Admitting: Gerontology

## 2017-04-13 ENCOUNTER — Non-Acute Institutional Stay (SKILLED_NURSING_FACILITY): Payer: PPO | Admitting: Gerontology

## 2017-04-13 DIAGNOSIS — L03115 Cellulitis of right lower limb: Secondary | ICD-10-CM | POA: Diagnosis not present

## 2017-04-13 DIAGNOSIS — S82891D Other fracture of right lower leg, subsequent encounter for closed fracture with routine healing: Secondary | ICD-10-CM | POA: Diagnosis not present

## 2017-04-13 DIAGNOSIS — F23 Brief psychotic disorder: Secondary | ICD-10-CM | POA: Diagnosis not present

## 2017-04-13 LAB — AEROBIC/ANAEROBIC CULTURE W GRAM STAIN (SURGICAL/DEEP WOUND)

## 2017-04-13 LAB — AEROBIC/ANAEROBIC CULTURE (SURGICAL/DEEP WOUND): CULTURE: NO GROWTH

## 2017-04-13 NOTE — Progress Notes (Signed)
Location:   [facility] Room Number: [place] [facility]:  SNF [phone]) Provider:  Toni Arthurs, NP-C  Sparks, [name], MD  [name], MD as PCP - General (Internal Medicine)  Extended Emergency Contact Information Primary Emergency Contact: [name],[name] Address: [place] Phone: [phone] Work Phone: [phone] Relation: Sister Secondary Emergency Contact: [place] Mobile Phone: [phone] Relation: Niece  Code Status:  DNR Goals of care: Advanced Directive information Advanced Directives 04/12/2017  Does Patient Have a Medical Advance Directive? Yes  Type of Paramedic of [place];Out of facility DNR (pink MOST or yellow form)  Does patient want to make changes to medical advance directive? No - Patient declined  Copy of [place] in Chart? Yes  Would patient like information on creating a medical advance directive? -     Chief Complaint  Patient presents with  . Acute Visit    Recheck Altered mental status and wound on foot    HPI:  Pt is a 84 y.o. female seen today for an acute visit for evaluation of psychotic event and woundvac reapplication. I received a call from nursing last evening. Pt became delusional, began screaming "[id number]" at the staff. Tried to scratch and bite staff. Was unable to be re-directed. Pt pulled wound vac off and began to pick at the open wound. She pulled pieces of skin/tissue off and was trying to throw the tissue at staff. Refusing all meds. Order given for Haldol 2.5 mg IM x 1 and to apply a wet to dry dressing to the wound, which now has significant tendon exposure. I notified the [place] RN from [facility]. Today, pt is more calm. She does not remember the events from the previous evening. Wound vac re-applied with mepitel barrier. No drainage/ no purulence. Surrounding tissue still red, less  warm and less painful than previous. Pt continues on IV antibiotics. Instructed nursing to notify podiatry of events. VSS.   Please note pt with limited verbal ability. Unable to obtain complete ROS. Some ROS info obtained from staff and documentation.   Past Medical History:  Diagnosis Date  . breast cancer   . Fibrocystic breast disease    Severe; status post bilateral mastectomies and implants  . GERD (gastroesophageal reflux disease)    Severe; manifested vocal cord irritation, status-post Nissen fundcoplication in 02/2876  . History of ataxia    with questionable right upper and lower  . History of breast cancer   . History of exertional chest pain    with her last stress echo in 09/1999 being normal  . History of migraine    vascular  . History of shingles   . History of syncope 12/1999   With negative cardiac workup to include an echocardiogram, which revealed only mild to moderate MR, a CT scan of the chest, which was negative, and a Cardiolite stress test, which was normal  . Hyperlipidemia, unspecified   . Hypertension    CTA 05/2004 showed no renovascular cause  . Mild dementia   . Muscle atrophy    Positive FANA and anti- SCL70 antibodies; S/p biopsy without eosinic fascitis; Remicade discontinued  . Osteoarthritis    a. Cervical spine. b. Lumbar spine/spinal stenosis. [facility])  . Osteoporosis    a. Fosamax b. Right wrist fracture. c. Sternal fracture ([place])  . Rheumatoid arthritis without elevated rheumatoid factor (  [place])    a. Methotrexate. b. S/p Remicade. c. Seronegative. d. S/p Plaquenil. [facility])   Past Surgical History:  Procedure Laterality Date  . APPENDECTOMY    . CHOLECYSTECTOMY    . IRRIGATION AND DEBRIDEMENT ABSCESS Right 04/08/2017   Procedure: IRRIGATION AND DEBRIDEMENT ABSCESS with wound vac;  Surgeon: Albertine Patricia, DPM;  Location: [facility] ORS;  Service: Podiatry;  Laterality: Right;  . LAPAROSCOPIC NISSEN FUNDOPLICATION  32/3557  . LUMBAR LAMINECTOMY      for spinal stenosis  . MASTECTOMY Bilateral    with implants  . ORIF DISTAL RADIUS FRACTURE Right 07/27/2008   with volar plate  . TOTAL ABDOMINAL HYSTERECTOMY W/ BILATERAL SALPINGOOPHORECTOMY    . VASCULAR SURGERY      Allergies  Allergen Reactions  . Atorvastatin Other (See Comments)    Myositis  . Bisphosphonates Other (See Comments)    GI upset  . Codeine Other (See Comments)  . Memantine Other (See Comments)  . Omeprazole Nausea Only  . Other Other (See Comments)    Leg cramps GI upset  . Salsalate Other (See Comments)  . Sulfa Antibiotics     Pt doesn't remember  . Penicillins Itching    Has patient had a PCN reaction causing immediate rash, facial/tongue/throat swelling, SOB or lightheadedness with hypotension: Yes Has patient had a PCN reaction causing severe rash involving mucus membranes or skin necrosis: No Has patient had a PCN reaction that required hospitalization: No Has patient had a PCN reaction occurring within the last 10 years: No If all of the above answers are "NO", then may proceed with Cephalosporin use.    Allergies as of 04/13/2017      Reactions   Atorvastatin Other (See Comments)   Myositis   Bisphosphonates Other (See Comments)   GI upset   Codeine Other (See Comments)   Memantine Other (See Comments)   Omeprazole Nausea Only   Other Other (See Comments)   Leg cramps GI upset   Salsalate Other (See Comments)   Sulfa Antibiotics    Pt doesn't remember   Penicillins Itching   Has patient had a PCN reaction causing immediate rash, facial/tongue/throat swelling, SOB or lightheadedness with hypotension: Yes Has patient had a PCN reaction causing severe rash involving mucus membranes or skin necrosis: No Has patient had a PCN reaction that required hospitalization: No Has patient had a PCN reaction occurring within the last 10 years: No If all of the above answers are "NO", then may proceed with Cephalosporin use.      Medication List         Accurate as of 04/13/17  3:15 PM. Always use your most recent med list.          acetaminophen 650 MG CR tablet Commonly known as:  TYLENOL Take 1,300 mg by mouth 2 (two) times daily.   amLODipine 10 MG tablet Commonly known as:  NORVASC Take 10 mg by mouth daily.   aspirin EC 81 MG tablet Take 81 mg by mouth daily.   cefTRIAXone 1 g in sodium chloride 0.9 % 100 mL Inject 1 g into the vein daily.   docusate sodium 100 MG capsule Commonly known as:  COLACE Take 1 capsule (100 mg total) by mouth 2 (two) times daily.   folic acid 1 MG tablet Commonly known as:  FOLVITE Take 1 mg by mouth daily.   heparin flush 10 UNIT/ML Soln injection Inject 5 mLs into the vein as needed. Flush PICC line using SASH (Saline, Antibiotic,  Saline, Heparin) method with each intermittent administration of medication. If not being used, flush every 24 hours with saline followed by heparin flush.   isosorbide mononitrate 30 MG 24 hr tablet Commonly known as:  IMDUR Take 30 mg by mouth daily.   losartan 100 MG tablet Commonly known as:  COZAAR Take 100 mg by mouth daily.   methotrexate 2.5 MG tablet Commonly known as:  RHEUMATREX Take 6 tablets by mouth every Friday.   metoprolol tartrate 100 MG tablet Commonly known as:  LOPRESSOR Take 100 mg by mouth 2 (two) times daily.   mirtazapine 7.5 MG tablet Commonly known as:  REMERON Take 7.5 mg by mouth at bedtime.   MULTI-VITAMINS Tabs Take 1 tablet by mouth daily.   QUEtiapine 25 MG tablet Commonly known as:  SEROQUEL Take 25 mg by mouth daily.   sertraline 100 MG tablet Commonly known as:  ZOLOFT Take 100 mg by mouth daily.   sodium chloride 0.9 % injection Inject 10 mLs into the vein daily as needed. Flush PICC line using SASH (Saline, Antibiotic, Saline, Heparin)method with each intermittent administration of medication. If not being used, flush every 24 hours with saline followed by heparin flush.   traMADol 50 MG  tablet Commonly known as:  ULTRAM Take 1 tablet (50 mg total) by mouth every 6 (six) hours as needed for moderate pain.   vitamin C 1000 MG tablet Take 1,000 mg by mouth daily.       Review of Systems  Unable to perform ROS: Dementia  Constitutional: Negative for activity change, appetite change, chills, diaphoresis and fever.  HENT: Negative for congestion, mouth sores, nosebleeds, postnasal drip, sneezing, sore throat, trouble swallowing and voice change.   Respiratory: Negative for apnea, cough, choking, chest tightness, shortness of breath and wheezing.   Cardiovascular: Negative for chest pain, palpitations and leg swelling.  Gastrointestinal: Negative for abdominal distention, abdominal pain, constipation, diarrhea and nausea.  Genitourinary: Negative for difficulty urinating, dysuria, frequency and urgency.  Musculoskeletal: Positive for arthralgias (typical arthritis) and myalgias. Negative for back pain and gait problem.  Skin: Positive for wound. Negative for color change, pallor and rash.  Neurological: Negative for dizziness, tremors, syncope, speech difficulty, weakness, numbness and headaches.  Psychiatric/Behavioral: Positive for agitation and behavioral problems.  All other systems reviewed and are negative.   Immunization History  Administered Date(s) Administered  . Influenza-Unspecified 11/27/2013  . Pneumococcal Polysaccharide-23 03/27/2013   Pertinent  Health Maintenance Due  Topic Date Due  . DEXA SCAN  04/28/1997  . PNA vac Low Risk Adult (2 of 2 - PCV13) 03/27/2014  . INFLUENZA VACCINE  09/27/2016   No flowsheet data found. Functional Status Survey:    Vitals:   04/13/17 1504  BP: (!) 155/55  Pulse: 76  Resp: 18  Temp: 98.4 F (36.9 C)  TempSrc: Oral  SpO2: 92%  Weight: 144 lb 14.4 oz (65.7 kg)  Height: 5\' 3"  (1.6 m)   Body mass index is 25.67 kg/m. Physical Exam  Constitutional: Vital signs are normal. She appears well-developed and  well-nourished. She is active and cooperative. She does not appear ill. No distress.  HENT:  Head: Normocephalic and atraumatic.  Mouth/Throat: Uvula is midline, oropharynx is clear and moist and mucous membranes are normal. Mucous membranes are not pale, not dry and not cyanotic.  Eyes: Pupils are equal, round, and reactive to light. Conjunctivae, EOM and lids are normal.  Neck: Trachea normal, normal range of motion and full passive range of motion  without pain. Neck supple. No JVD present. No tracheal deviation, no edema and no erythema present. No thyromegaly present.  Cardiovascular: Normal rate, regular rhythm, normal heart sounds, intact distal pulses and normal pulses. Exam reveals no gallop, no distant heart sounds and no friction rub.  No murmur heard. Pulses:      Dorsalis pedis pulses are 2+ on the right side, and 2+ on the left side.  No edema  Pulmonary/Chest: Effort normal and breath sounds normal. No accessory muscle usage. No respiratory distress. She has no decreased breath sounds. She has no wheezes. She has no rhonchi. She has no rales. She exhibits no tenderness.  Abdominal: Soft. Normal appearance and bowel sounds are normal. She exhibits no distension and no ascites. There is no tenderness.  Musculoskeletal: She exhibits no edema.       Right ankle: She exhibits decreased range of motion, swelling and laceration. Tenderness.  Expected osteoarthritis, stiffness; Bilateral Calves soft, supple. Negative Homan's Sign. B- pedal pulses equal  Neurological: She is alert. She has normal strength. Coordination and gait abnormal.  Skin: Skin is warm, dry and intact. Ecchymosis noted. She is not diaphoretic. No cyanosis. No pallor. Nails show no clubbing.  Right ankle fracture with open wound on dorsum of foot with tendon exposure. Woundvac with Mepitel in place now  Psychiatric: Her speech is normal. Her mood appears anxious. She is agitated, aggressive and combative. Thought content  is delusional. Cognition and memory are impaired. She expresses impulsivity and inappropriate judgment. She exhibits abnormal recent memory.  Nursing note and vitals reviewed.   Labs reviewed: Recent Labs    04/06/17 0929 04/07/17 0400 04/09/17 0108 04/12/17 0915  NA 141 138  --  141  K 4.7 4.3  --  3.4*  CL 108 104  --  105  CO2 23 22  --  22  GLUCOSE 96 119*  --  142*  BUN 34* 26*  --  19  CREATININE 1.19* 1.07* 1.33* 0.92  CALCIUM 9.2 8.5*  --  9.3   Recent Labs    03/22/17 0600 04/06/17 0929 04/12/17 0915  AST 27 20 24   ALT 17 14 17   ALKPHOS 63 66 68  BILITOT 1.1 0.8 0.6  PROT 6.8 6.9 6.9  ALBUMIN 3.7 3.3* 3.3*   Recent Labs    03/31/17 0324 04/06/17 0929 04/07/17 0400 04/12/17 0915  WBC 13.5* 16.4* 15.4* 13.7*  NEUTROABS 9.7* 12.7*  --  11.0*  HGB 11.8* 11.6* 10.9* 11.5*  HCT 35.8 34.9* 33.7* 35.6  MCV 117.5* 117.6* 117.5* 116.2*  PLT 345 345 349 366   Lab Results  Component Value Date   TSH 1.412 06/23/2016   No results found for: HGBA1C No results found for: CHOL, HDL, LDLCALC, LDLDIRECT, TRIG, CHOLHDL  Significant Diagnostic Results in last 30 days:  Dg Chest 2 View  Result Date: 03/19/2017 CLINICAL DATA:  Elevated white cell count. History of breast cancer. EXAM: CHEST  2 VIEW COMPARISON:  Chest and CT chest 06/23/2016 FINDINGS: Heart size and pulmonary vascularity are normal probable emphysematous changes in the lungs. Peribronchial thickening consistent with chronic bronchitis. No airspace disease or consolidation. No blunting of costophrenic angles. No pneumothorax. Mediastinal contours appear intact. Calcification of the aorta. Degenerative changes in the spine. IMPRESSION: Mild emphysematous and chronic bronchitic changes in the lungs. No evidence of active pulmonary disease. Aortic atherosclerosis. Electronically Signed   By: Lucienne Capers M.D.   On: 03/19/2017 00:59   Dg Shoulder Right  Result Date: 03/18/2017  CLINICAL DATA:  [place] off of  bus EXAM: RIGHT SHOULDER - 2+ VIEW COMPARISON:  None. FINDINGS: No fracture or malalignment. AC joint degenerative change. Mild glenohumeral degenerative change. Right lung apex is clear IMPRESSION: No acute osseous abnormality Electronically Signed   By: Donavan Foil M.D.   On: 03/18/2017 19:47   Dg Wrist Complete Right  Result Date: 03/18/2017 CLINICAL DATA:  [place] off bus, wrist deformity EXAM: RIGHT WRIST - COMPLETE 3+ VIEW COMPARISON:  None. FINDINGS: No acute fracture or malalignment. Patient is status post prior surgical plate and multiple screw fixation of the distal radius for old fracture. Marked arthritis at the first [facility] joint and at the distal scaphoid. No radiopaque foreign body. IMPRESSION: Postsurgical changes of the distal radius. No definite acute osseous abnormality Electronically Signed   By: Donavan Foil M.D.   On: 03/18/2017 19:40   Dg Ankle 2 Views Right  Result Date: 03/18/2017 CLINICAL DATA:  [place] off of bus EXAM: RIGHT ANKLE - 2 VIEW COMPARISON:  None. FINDINGS: No fracture or malalignment. Dorsal spurs off the navicular and talus. Ankle mortise is symmetric. Gas in the dorsal soft tissues of the mid to distal foot IMPRESSION: 1. No acute osseous abnormality 2. Gas in the dorsal soft tissues of the mid to distal foot Electronically Signed   By: Donavan Foil M.D.   On: 03/18/2017 19:46   Ct Head Wo Contrast  Result Date: 03/18/2017 CLINICAL DATA:  Follow-up off of bus.  Neck pain. EXAM: CT HEAD WITHOUT CONTRAST CT CERVICAL SPINE WITHOUT CONTRAST TECHNIQUE: Multidetector CT imaging of the head and cervical spine was performed following the standard protocol without intravenous contrast. Multiplanar CT image reconstructions of the cervical spine were also generated. COMPARISON:  Head CT 11/26/2013 FINDINGS: CT HEAD FINDINGS Brain: No intracranial hemorrhage. No parenchymal contusion. No midline shift or mass effect. Basilar cisterns are patent. No skull base fracture. No fluid in  the paranasal sinuses or mastoid air cells. Orbits are normal. There are periventricular and subcortical white matter hypodensities. Generalized cortical atrophy. Vascular: No hyperdense vessel or unexpected calcification. Skull: No skull fracture. Small scalp hematoma over the RIGHT frontal bone. The Sinuses/Orbits: No acute finding. Other: None CT CERVICAL SPINE FINDINGS Alignment: Normal alignment of the cervical vertebral bodies. Skull base and vertebrae: Normal craniocervical junction. No loss of vertebral body height or disc height. Normal facet articulation. No evidence of fracture. Soft tissues and spinal canal: No prevertebral soft tissue swelling. No perispinal or epidural hematoma. Disc levels:  Endplate spurring and disc space narrowing from C5-C7. Upper chest: Clear Other: None IMPRESSION: 1. No intracranial trauma. 2. Small RIGHT frontal scalp hematoma without fracture. 3. Chronic atrophy and white matter microvascular disease. 4. No cervical spine fracture. 5. Mild multilevel disc osteophytic disease. Electronically Signed   By: Suzy Bouchard M.D.   On: 03/18/2017 19:26   Ct Cervical Spine Wo Contrast  Result Date: 03/18/2017 CLINICAL DATA:  Follow-up off of bus.  Neck pain. EXAM: CT HEAD WITHOUT CONTRAST CT CERVICAL SPINE WITHOUT CONTRAST TECHNIQUE: Multidetector CT imaging of the head and cervical spine was performed following the standard protocol without intravenous contrast. Multiplanar CT image reconstructions of the cervical spine were also generated. COMPARISON:  Head CT 11/26/2013 FINDINGS: CT HEAD FINDINGS Brain: No intracranial hemorrhage. No parenchymal contusion. No midline shift or mass effect. Basilar cisterns are patent. No skull base fracture. No fluid in the paranasal sinuses or mastoid air cells. Orbits are normal. There are periventricular and subcortical white matter  hypodensities. Generalized cortical atrophy. Vascular: No hyperdense vessel or unexpected calcification.  Skull: No skull fracture. Small scalp hematoma over the RIGHT frontal bone. The Sinuses/Orbits: No acute finding. Other: None CT CERVICAL SPINE FINDINGS Alignment: Normal alignment of the cervical vertebral bodies. Skull base and vertebrae: Normal craniocervical junction. No loss of vertebral body height or disc height. Normal facet articulation. No evidence of fracture. Soft tissues and spinal canal: No prevertebral soft tissue swelling. No perispinal or epidural hematoma. Disc levels:  Endplate spurring and disc space narrowing from C5-C7. Upper chest: Clear Other: None IMPRESSION: 1. No intracranial trauma. 2. Small RIGHT frontal scalp hematoma without fracture. 3. Chronic atrophy and white matter microvascular disease. 4. No cervical spine fracture. 5. Mild multilevel disc osteophytic disease. Electronically Signed   By: Suzy Bouchard M.D.   On: 03/18/2017 19:26   Dg Hand 2 View Right  Result Date: 03/18/2017 CLINICAL DATA:  [place] off bus with wrist deformity EXAM: RIGHT HAND - 2 VIEW COMPARISON:  None. FINDINGS: No fracture is seen. Radial deviation at the second DIP joint. Arthritis second through fifth DIP joints with small suspected erosions at the heads of the second third and fourth middle phalanges. Mild arthritis at the second third and fourth PIP joints. Moderate arthritis at the base of the first [facility] joint and radial aspect of the wrist. IMPRESSION: 1. No acute fracture is seen 2. Arthritis at the DIP and PIP joints as well as the radial aspect of the wrist; small erosive changes are suspected, query inflammatory arthritis Electronically Signed   By: Donavan Foil M.D.   On: 03/18/2017 19:42   Mr Foot Right Wo Contrast  Result Date: 03/30/2017 CLINICAL DATA:  Cellulitis of the toe.  Pain. EXAM: MRI OF THE RIGHT FOREFOOT WITHOUT CONTRAST TECHNIQUE: Multiplanar, multisequence MR imaging of the right forefoot was performed. No intravenous contrast was administered. COMPARISON:  None. FINDINGS:  Bones/Joint/Cartilage Nondisplaced fracture at the lateral base of the first proximal phalanx with mild surrounding marrow edema. Nondisplaced oblique fracture of the distal shaft of the first metatarsal with surrounding marrow edema. Nondisplaced fracture of the second metatarsal neck with marrow edema in the second metatarsal shaft and second metatarsal head. Nondisplaced fracture of third metatarsal head. Severe marrow edema with a linear component involving the distal anterior calcaneus adjacent to the calcaneocuboid joint partially visualized most consistent with a nondisplaced fracture. No periosteal reaction or bone destruction. Normal alignment. No joint effusion. Ligaments Collateral ligaments are intact.  Lisfranc ligament is intact Muscles and Tendons Flexor, peroneal and extensor compartment tendons are intact. Muscles are normal. Soft tissue No fluid collection or hematoma. No soft tissue mass. Soft tissue edema along the dorsal aspect of the foot which may be reactive secondary to venous insufficiency versus cellulitis. IMPRESSION: 1. Nondisplaced fracture at the lateral base of the first proximal phalanx with mild surrounding marrow edema. 2. Nondisplaced oblique fracture of the distal shaft of the first metatarsal with surrounding marrow edema. 3. Nondisplaced fracture of the second metatarsal neck with marrow edema in the second metatarsal shaft and second metatarsal head. 4. Nondisplaced fracture of third metatarsal head. 5. Nondisplaced fracture of the distal anterior calcaneus at the calcaneocuboid joint. 6. No evidence of osteomyelitis. Electronically Signed   By: Kathreen Devoid   On: 03/30/2017 16:29   Mr Foot Right W Wo Contrast  Result Date: 04/07/2017 CLINICAL DATA:  Right foot cellulitis. Evaluate for abscess or osteomyelitis. EXAM: MRI OF THE RIGHT FOREFOOT WITHOUT AND WITH CONTRAST TECHNIQUE: Multiplanar,  multisequence MR imaging of the right forefoot was performed before and after the  administration of intravenous contrast. CONTRAST:  80mL MULTIHANCE GADOBENATE DIMEGLUMINE 529 MG/ML IV SOLN COMPARISON:  Right foot x-rays dated April 06, 2017. Right foot MRI dated March 30, 2017. FINDINGS: Bones/Joint/Cartilage Unchanged nondisplaced fracture at the lateral base of the first proximal phalanx. Unchanged nondisplaced fracture of the distal shaft of the first metatarsal, second metatarsal neck and third metatarsal head. Unchanged nondisplaced fracture of the anterior calcaneus adjacent to the calcaneocuboid joint. No periosteal reaction or cortical destruction. Normal alignment. No joint effusion. Ligaments Collateral ligaments are intact.  Lisfranc ligament is intact. Muscles and Tendons Flexor, peroneal and extensor compartment tendons are intact. Mild increased STIR signal within the intrinsic muscles of the forefoot, likely related to diabetic muscle changes. Soft tissue New ill-defined, rim enhancing fluid collection over the dorsum of the lateral foot, extending from the MTP joints to the midfoot. The fluid collection measures approximately 8.6 x 2.3 x 1.6 cm (AP by transverse by CC). This communicates with the overlying skin surface. No soft tissue mass. IMPRESSION: 1. Ill-defined, rim enhancing 8.6 cm fluid collection over the dorsum of the lateral foot, extending from the MTP joints to the midfoot, consistent with abscess. This appears to drain to the overlying skin surface. 2. No evidence of osteomyelitis. 3. Unchanged nondisplaced fractures of the first proximal phalanx, distal first metatarsal shaft, second metatarsal neck, third metatarsal head, and distal anterior calcaneus near the calcaneocuboid joint. Electronically Signed   By: Titus Dubin M.D.   On: 04/07/2017 10:47   Dg Chest Port 1 View  Result Date: 04/09/2017 CLINICAL DATA:  84 year old female status post PICC line placement. EXAM: PORTABLE CHEST 1 VIEW COMPARISON:  03/19/2017 and earlier. FINDINGS: Portable AP  upright view at 2214 hrs. A right side PICC line catheter has been placed, but loops in the right subclavian region and is directed retrograde into the right axilla. The tip is also looped onto itself in the axillary region. Stable lung volumes. Stable mediastinal contours. No acute pulmonary opacity. Paucity of bowel gas in the upper abdomen. IMPRESSION: 1. Right PICC line courses centrally to the right subclavian vein level, but then loops peripherally into the right axilla. This should be repositioned. 2.  No acute cardiopulmonary abnormality. Electronically Signed   By: Genevie Ann M.D.   On: 04/09/2017 22:32   Dg Foot 2 Views Right  Result Date: 03/26/2017 CLINICAL DATA:  History of a fall 8 days ago resulting in lacerations over the dorsum of the foot requiring stitches. Patient also had an acute fracture of the base of the proximal phalanx of the great toe. The patient now is complaining of increased foot pain, erythema, and swelling. Clinical suspicion of cellulitis. EXAM: RIGHT FOOT - 2 VIEW COMPARISON:  Right foot series of March 18, 2017 FINDINGS: The displaced fracture through the lateral aspect of the base of the fifth metatarsal is again demonstrated and appears stable. The second metatarsal neck fracture is not well demonstrated. The other phalanges and metatarsals are intact. The tarsal bones exhibit no acute abnormalities. There is mild soft tissue swelling over the forefoot and midfoot. No soft tissue gas collections or foreign bodies are observed. IMPRESSION: Mild soft tissue swelling may reflect cellulitis. No plain film evidence of osteomyelitis. There are post traumatic changes of the base of the proximal phalanx of the great toe and possibly of the neck of the second metatarsal as described. Electronically Signed   By: Shanon Brow  [place] M.D.   On: 03/26/2017 12:37   Dg Foot 2 Views Right  Result Date: 03/18/2017 CLINICAL DATA:  [place] [address] off bus with bruising EXAM: RIGHT FOOT - 2 VIEW COMPARISON:   None. FINDINGS: Acute, mildly displaced intra-articular fracture at the base of the first proximal phalanx. Suspected nondisplaced fracture at the neck of the second metatarsal. No subluxation. Moderate gas within the dorsal soft tissues with soft tissue swelling present. IMPRESSION: 1. Acute displaced intra-articular fracture at the base of the first proximal phalanx with suspected nondisplaced fracture at the neck of the second metatarsal 2. Dorsal soft tissue swelling with moderate soft tissue gas; correlate with physical exam for open wound or laceration Electronically Signed   By: Donavan Foil M.D.   On: 03/18/2017 19:44   Dg Foot Complete Right  Result Date: 04/06/2017 Please see detailed radiograph report in office note.   Assessment/Plan Acute psychosis (HCC)  Haldol 2.5 mg IM x 1 (given last evening)  Seroquel 25 mg po BID  DC Oxycodone  Cellulitis of right foot  Continue Ceftriaxone 1 Gram IV Q Day  Continue woundvac   125 mmhg   Change wound vac Q Monday, Wednesday, Friday  Follow up with Dr Ola Spurr as instructed   Ankle fracture, right, closed, with routine healing, subsequent encounter  Continue working with PT/OT  Continue exercises as taught by PT/OT  Continue APAP CR 1,300 mg po BID  Continue Tramadol 50 mg 1-2 tablets po Q 4 hours prn pain  Elevate leg when at rest  Follow up Orthopedist as instructed  Family/ staff Communication:   Total Time:  Documentation:  Face to Face:  Family/Phone:   Labs/tests ordered:    Medication list reviewed and assessed for continued appropriateness.  Vikki Ports, NP-C Geriatrics [facility] [phone], [place] [address] Cell Phone (Mon-Fri 8am-5pm):  [phone] On Call:  [phone] & follow prompts after 5pm & weekends Office Phone:  [phone] Office Fax:  [phone]

## 2017-04-16 DIAGNOSIS — L97513 Non-pressure chronic ulcer of other part of right foot with necrosis of muscle: Secondary | ICD-10-CM | POA: Diagnosis not present

## 2017-04-19 ENCOUNTER — Other Ambulatory Visit
Admission: RE | Admit: 2017-04-19 | Discharge: 2017-04-19 | Disposition: A | Discharge status: Home / Self Care | Payer: PPO | Source: Ambulatory Visit | Attending: Internal Medicine | Admitting: Internal Medicine

## 2017-04-19 DIAGNOSIS — L03115 Cellulitis of right lower limb: Secondary | ICD-10-CM | POA: Insufficient documentation

## 2017-04-19 LAB — COMPREHENSIVE METABOLIC PANEL
ALBUMIN: 3.5 g/dL (ref 3.5–5.0)
ALT: 15 U/L (ref 14–54)
ANION GAP: 13 (ref 5–15)
AST: 22 U/L (ref 15–41)
Alkaline Phosphatase: 56 U/L (ref 38–126)
BUN: 22 mg/dL — AB (ref 6–20)
CHLORIDE: 107 mmol/L (ref 101–111)
CO2: 23 mmol/L (ref 22–32)
Calcium: 9.1 mg/dL (ref 8.9–10.3)
Creatinine, Ser: 0.87 mg/dL (ref 0.44–1.00)
GFR calc Af Amer: >60 mL/min (ref >60)
GFR calc non Af Amer: 59 mL/min — ABNORMAL LOW (ref >60)
GLUCOSE: 76 mg/dL (ref 65–99)
POTASSIUM: 3.5 mmol/L (ref 3.5–5.1)
SODIUM: 143 mmol/L (ref 135–145)
TOTAL PROTEIN: 7 g/dL (ref 6.5–8.1)
Total Bilirubin: 0.6 mg/dL (ref 0.3–1.2)

## 2017-04-19 LAB — CBC WITH DIFFERENTIAL/PLATELET
BASOS ABS: 0.1 10*3/uL (ref 0–0.1)
BASOS PCT: 1 %
EOS ABS: 0.4 10*3/uL (ref 0–0.7)
EOS PCT: 6 %
HCT: 35.6 % (ref 35.0–47.0)
Hemoglobin: 11.6 g/dL — ABNORMAL LOW (ref 12.0–16.0)
Lymphocytes Relative: 16 %
Lymphs Abs: 1.2 10*3/uL (ref 1.0–3.6)
MCH: 37.5 pg — ABNORMAL HIGH (ref 26.0–34.0)
MCHC: 32.7 g/dL (ref 32.0–36.0)
MCV: 114.8 fL — ABNORMAL HIGH (ref 80.0–100.0)
MONO ABS: 1.1 10*3/uL — AB (ref 0.2–0.9)
MONOS PCT: 15 %
NEUTROS ABS: 4.8 10*3/uL (ref 1.4–6.5)
Neutrophils Relative %: 62 %
PLATELETS: 265 10*3/uL (ref 150–440)
RBC: 3.1 MIL/uL — ABNORMAL LOW (ref 3.80–5.20)
RDW: 16.1 % — AB (ref 11.5–14.5)
WBC: 7.7 10*3/uL (ref 3.6–11.0)

## 2017-04-19 LAB — C-REACTIVE PROTEIN: CRP: 1.7 mg/dL — ABNORMAL HIGH (ref <1.0)

## 2017-04-20 ENCOUNTER — Non-Acute Institutional Stay (SKILLED_NURSING_FACILITY): Payer: PPO | Admitting: Gerontology

## 2017-04-20 ENCOUNTER — Encounter: Payer: Self-pay | Admitting: Gerontology

## 2017-04-20 DIAGNOSIS — S82891D Other fracture of right lower leg, subsequent encounter for closed fracture with routine healing: Secondary | ICD-10-CM | POA: Diagnosis not present

## 2017-04-20 DIAGNOSIS — L03115 Cellulitis of right lower limb: Secondary | ICD-10-CM | POA: Diagnosis not present

## 2017-04-20 DIAGNOSIS — F0391 Unspecified dementia with behavioral disturbance: Secondary | ICD-10-CM | POA: Diagnosis not present

## 2017-04-20 DIAGNOSIS — F0392 Unspecified dementia, unspecified severity, with psychotic disturbance: Secondary | ICD-10-CM

## 2017-04-20 NOTE — Progress Notes (Signed)
Location:   [place] Room Number: [place] of Service:  SNF [phone]) Provider:  Toni Arthurs, NP-C  [place]parks, [name], MD  [name], MD as PCP - General (Internal Medicine)  Extended Emergency Contact Information Primary Emergency Contact: [name],[name] Address: [place] Phone: [phone] Work Phone: [phone] Relation: Sister Secondary Emergency Contact: [place] Mobile Phone: [phone] Relation: Niece  Code Status:  DNR Goals of care: Advanced Directive information Advanced Directives 04/20/2017  Does Patient Have a Medical Advance Directive? Yes  Type of Paramedic of [place];Out of facility DNR (pink MOST or yellow form)  Does patient want to make changes to medical advance directive? -  Copy of [place] in Chart? Yes  Would patient like information on creating a medical advance directive? -     Chief Complaint  Patient presents with  . Medical Management of Chronic Issues    Routine Visit    HPI:  Pt is a 84 y.o. female seen today for medical management of chronic diseases. Pt has a right ankle fracture and wound to the dorsum of the right foot as a result of cellulitis and abscess. The abscess underwent I&D. Pt has been on IV antibiotics. Antibiotics are scheduled to stop in 5 days. Pt had psychotic episode last week and was given Haldol IM and started on seroquel. Pt has had intermittent confusion, but no further episodes of psychosis. Pt does continually try to pull at the woundvac. It has had to be replaced multiple times. Woundvac is off at this point. Nursing to replace shortly. Periwound tissue somewhat red. No drainage. Pt reports pain is improved. Pt and family having careplan meeting to discuss longterm care plan for pt as she needs 24 hour assistance d/t debility and dementia. Pt reports pain is  controlled. She is eating fairly well. Voiding well and having regular BMs. She continues to participate in PT/OT. She is mobile on the unit with a walker. VSS. No other complaints.      Please note pt with limited verbal ability. Unable to obtain complete ROS. Some ROS info obtained from staff and documentation.    Past Medical History:  Diagnosis Date  . breast cancer   . Fibrocystic breast disease    Severe; status post bilateral mastectomies and implants  . GERD (gastroesophageal reflux disease)    Severe; manifested vocal cord irritation, status-post Nissen fundcoplication in 09/1015  . History of ataxia    with questionable right upper and lower  . History of breast cancer   . History of exertional chest pain    with her last stress echo in 09/1999 being normal  . History of migraine    vascular  . History of shingles   . History of syncope 12/1999   With negative cardiac workup to include an echocardiogram, which revealed only mild to moderate MR, a CT scan of the chest, which was negative, and a Cardiolite stress test, which was normal  . Hyperlipidemia, unspecified   . Hypertension    CTA 05/2004 showed no renovascular cause  . Mild dementia   . Muscle atrophy    Positive FANA and anti- SCL70 antibodies; S/p biopsy without eosinic fascitis; Remicade discontinued  . Osteoarthritis    a. Cervical spine. b. Lumbar spine/spinal stenosis. [facility])  . Osteoporosis    a. Fosamax b. Right wrist  fracture. c. Sternal fracture ([facility])  . Rheumatoid arthritis without elevated rheumatoid factor (HCC)    a. Methotrexate. b. S/p Remicade. c. Seronegative. d. S/p Plaquenil. [facility])   Past Surgical History:  Procedure Laterality Date  . APPENDECTOMY    . CHOLECYSTECTOMY    . IRRIGATION AND DEBRIDEMENT ABSCESS Right 04/08/2017   Procedure: IRRIGATION AND DEBRIDEMENT ABSCESS with wound vac;  Surgeon: Albertine Patricia, DPM;  Location: [facility] ORS;  Service: Podiatry;  Laterality: Right;  .  LAPAROSCOPIC NISSEN FUNDOPLICATION  24/0973  . LUMBAR LAMINECTOMY     for spinal stenosis  . MASTECTOMY Bilateral    with implants  . ORIF DISTAL RADIUS FRACTURE Right 07/27/2008   with volar plate  . TOTAL ABDOMINAL HYSTERECTOMY W/ BILATERAL SALPINGOOPHORECTOMY    . VASCULAR SURGERY      Allergies  Allergen Reactions  . Atorvastatin Other (See Comments)    Myositis  . Bisphosphonates Other (See Comments)    GI upset  . Codeine Other (See Comments)  . Memantine Other (See Comments)  . Omeprazole Nausea Only  . Other Other (See Comments)    Leg cramps GI upset  . Salsalate Other (See Comments)  . Sulfa Antibiotics     Pt doesn't remember  . Penicillins Itching    Has patient had a PCN reaction causing immediate rash, facial/tongue/throat swelling, SOB or lightheadedness with hypotension: Yes Has patient had a PCN reaction causing severe rash involving mucus membranes or skin necrosis: No Has patient had a PCN reaction that required hospitalization: No Has patient had a PCN reaction occurring within the last 10 years: No If all of the above answers are "NO", then may proceed with Cephalosporin use.    Allergies as of 04/20/2017      Reactions   Atorvastatin Other (See Comments)   Myositis   Bisphosphonates Other (See Comments)   GI upset   Codeine Other (See Comments)   Memantine Other (See Comments)   Omeprazole Nausea Only   Other Other (See Comments)   Leg cramps GI upset   Salsalate Other (See Comments)   Sulfa Antibiotics    Pt doesn't remember   Penicillins Itching   Has patient had a PCN reaction causing immediate rash, facial/tongue/throat swelling, SOB or lightheadedness with hypotension: Yes Has patient had a PCN reaction causing severe rash involving mucus membranes or skin necrosis: No Has patient had a PCN reaction that required hospitalization: No Has patient had a PCN reaction occurring within the last 10 years: No If all of the above answers are  "NO", then may proceed with Cephalosporin use.      Medication List        Accurate as of 04/20/17  4:06 PM. Always use your most recent med list.          acetaminophen 650 MG CR tablet Commonly known as:  TYLENOL Take 1,300 mg by mouth 2 (two) times daily.   amLODipine 10 MG tablet Commonly known as:  NORVASC Take 10 mg by mouth daily.   aspirin EC 81 MG tablet Take 81 mg by mouth daily.   cefTRIAXone 1 g in sodium chloride 0.9 % 100 mL Inject 1 g into the vein daily.   docusate sodium 100 MG capsule Commonly known as:  COLACE Take 1 capsule (100 mg total) by mouth 2 (two) times daily.   folic acid 1 MG tablet Commonly known as:  FOLVITE Take 1 mg by mouth daily.   heparin flush 10 UNIT/ML Soln injection Inject 5  mLs into the vein as needed. Flush PICC line using SASH (Saline, Antibiotic, Saline, Heparin) method with each intermittent administration of medication. If not being used, flush every 24 hours with saline followed by heparin flush.   isosorbide mononitrate 30 MG 24 hr tablet Commonly known as:  IMDUR Take 30 mg by mouth daily.   losartan 100 MG tablet Commonly known as:  COZAAR Take 100 mg by mouth daily.   methotrexate 2.5 MG tablet Commonly known as:  RHEUMATREX Take 6 tablets by mouth every Friday.   metoprolol tartrate 100 MG tablet Commonly known as:  LOPRESSOR Take 100 mg by mouth 2 (two) times daily.   mirtazapine 7.5 MG tablet Commonly known as:  REMERON Take 7.5 mg by mouth at bedtime.   MULTI-VITAMINS Tabs Take 1 tablet by mouth daily.   QUEtiapine 25 MG tablet Commonly known as:  SEROQUEL Take 25 mg by mouth daily.   sertraline 100 MG tablet Commonly known as:  ZOLOFT Take 100 mg by mouth daily.   sodium chloride 0.9 % injection Inject 10 mLs into the vein daily as needed. Flush PICC line using SASH (Saline, Antibiotic, Saline, Heparin)method with each intermittent administration of medication. If not being used, flush every 24  hours with saline followed by heparin flush.   traMADol 50 MG tablet Commonly known as:  ULTRAM Take 1 tablet (50 mg total) by mouth every 6 (six) hours as needed for moderate pain.   vitamin C 1000 MG tablet Take 1,000 mg by mouth daily.       Review of Systems  Unable to perform ROS: Dementia  Constitutional: Negative for activity change, appetite change, chills, diaphoresis and fever.  HENT: Negative for congestion, mouth sores, nosebleeds, postnasal drip, sneezing, sore throat, trouble swallowing and voice change.   Respiratory: Negative for apnea, cough, choking, chest tightness, shortness of breath and wheezing.   Cardiovascular: Negative for chest pain, palpitations and leg swelling.  Gastrointestinal: Negative for abdominal distention, abdominal pain, constipation, diarrhea and nausea.  Genitourinary: Negative for difficulty urinating, dysuria, frequency and urgency.  Musculoskeletal: Positive for arthralgias (typical arthritis) and myalgias. Negative for back pain and gait problem.  Skin: Positive for wound. Negative for color change, pallor and rash.  Neurological: Negative for dizziness, tremors, syncope, speech difficulty, weakness, numbness and headaches.  Psychiatric/Behavioral: Positive for confusion. Negative for agitation and behavioral problems.  All other systems reviewed and are negative.   Immunization History  Administered Date(s) Administered  . Influenza-Unspecified 11/27/2013  . Pneumococcal Polysaccharide-23 03/27/2013   Pertinent  Health Maintenance Due  Topic Date Due  . DEXA SCAN  04/28/1997  . PNA vac Low Risk Adult (2 of 2 - PCV13) 03/27/2014  . INFLUENZA VACCINE  09/27/2016   No flowsheet data found. Functional Status Survey:    Vitals:   04/20/17 1559  BP: (!) 130/54  Pulse: 70  Resp: 20  Temp: 98.5 F (36.9 C)  TempSrc: Oral  SpO2: 97%  Weight: 136 lb 6.4 oz (61.9 kg)  Height: 5\' 3"  (1.6 m)   Body mass index is 24.16  kg/m. Physical Exam  Constitutional: Vital signs are normal. She appears well-developed and well-nourished. She is active and cooperative. She does not appear ill. No distress.  HENT:  Head: Normocephalic and atraumatic.  Mouth/Throat: Uvula is midline, oropharynx is clear and moist and mucous membranes are normal. Mucous membranes are not pale, not dry and not cyanotic.  Eyes: Pupils are equal, round, and reactive to light. Conjunctivae, EOM and lids  are normal.  Neck: Trachea normal, normal range of motion and full passive range of motion without pain. Neck supple. No JVD present. No tracheal deviation, no edema and no erythema present. No thyromegaly present.  Cardiovascular: Normal rate, regular rhythm, normal heart sounds, intact distal pulses and normal pulses. Exam reveals no gallop, no distant heart sounds and no friction rub.  No murmur heard. Pulses:      Dorsalis pedis pulses are 2+ on the right side, and 2+ on the left side.  No edema  Pulmonary/Chest: Effort normal and breath sounds normal. No accessory muscle usage. No respiratory distress. She has no decreased breath sounds. She has no wheezes. She has no rhonchi. She has no rales. She exhibits no tenderness.  Abdominal: Soft. Normal appearance and bowel sounds are normal. She exhibits no distension and no ascites. There is no tenderness.  Musculoskeletal: She exhibits no edema.       Right ankle: She exhibits decreased range of motion and ecchymosis. Tenderness.  Expected osteoarthritis, stiffness; Bilateral Calves soft, supple. Negative Homan's Sign. B- pedal pulses equal; right ankle fracture  Neurological: She is alert. She has normal strength. Coordination and gait abnormal.  Skin: Skin is warm, dry and intact. Ecchymosis noted. She is not diaphoretic. No cyanosis. No pallor. Nails show no clubbing.  Right ankle cellulitis, abscess. Continues on IV antibiotics  Psychiatric: Her speech is normal and behavior is normal.  Thought content normal. Her mood appears anxious. Cognition and memory are impaired. She expresses impulsivity and inappropriate judgment.  Nursing note and vitals reviewed.   Labs reviewed: Recent Labs    04/07/17 0400 04/09/17 0108 04/12/17 0915 04/19/17 0440  NA 138  --  141 143  K 4.3  --  3.4* 3.5  CL 104  --  105 107  CO2 22  --  22 23  GLUCOSE 119*  --  142* 76  BUN 26*  --  19 22*  CREATININE 1.07* 1.33* 0.92 0.87  CALCIUM 8.5*  --  9.3 9.1   Recent Labs    04/06/17 0929 04/12/17 0915 04/19/17 0440  AST 20 24 22   ALT 14 17 15   ALKPHOS 66 68 56  BILITOT 0.8 0.6 0.6  PROT 6.9 6.9 7.0  ALBUMIN 3.3* 3.3* 3.5   Recent Labs    04/06/17 0929 04/07/17 0400 04/12/17 0915 04/19/17 0440  WBC 16.4* 15.4* 13.7* 7.7  NEUTROABS 12.7*  --  11.0* 4.8  HGB 11.6* 10.9* 11.5* 11.6*  HCT 34.9* 33.7* 35.6 35.6  MCV 117.6* 117.5* 116.2* 114.8*  PLT 345 349 366 265   Lab Results  Component Value Date   TSH 1.412 06/23/2016   No results found for: HGBA1C No results found for: CHOL, HDL, LDLCALC, LDLDIRECT, TRIG, CHOLHDL  Significant Diagnostic Results in last 30 days:  Mr Foot Right Wo Contrast  Result Date: 03/30/2017 CLINICAL DATA:  Cellulitis of the toe.  Pain. EXAM: MRI OF THE RIGHT FOREFOOT WITHOUT CONTRAST TECHNIQUE: Multiplanar, multisequence MR imaging of the right forefoot was performed. No intravenous contrast was administered. COMPARISON:  None. FINDINGS: Bones/Joint/Cartilage Nondisplaced fracture at the lateral base of the first proximal phalanx with mild surrounding marrow edema. Nondisplaced oblique fracture of the distal shaft of the first metatarsal with surrounding marrow edema. Nondisplaced fracture of the second metatarsal neck with marrow edema in the second metatarsal shaft and second metatarsal head. Nondisplaced fracture of third metatarsal head. Severe marrow edema with a linear component involving the distal anterior calcaneus adjacent to the  calcaneocuboid joint partially visualized most consistent with a nondisplaced fracture. No periosteal reaction or bone destruction. Normal alignment. No joint effusion. Ligaments Collateral ligaments are intact.  Lisfranc ligament is intact Muscles and Tendons Flexor, peroneal and extensor compartment tendons are intact. Muscles are normal. Soft tissue No fluid collection or hematoma. No soft tissue mass. Soft tissue edema along the dorsal aspect of the foot which may be reactive secondary to venous insufficiency versus cellulitis. IMPRESSION: 1. Nondisplaced fracture at the lateral base of the first proximal phalanx with mild surrounding marrow edema. 2. Nondisplaced oblique fracture of the distal shaft of the first metatarsal with surrounding marrow edema. 3. Nondisplaced fracture of the second metatarsal neck with marrow edema in the second metatarsal shaft and second metatarsal head. 4. Nondisplaced fracture of third metatarsal head. 5. Nondisplaced fracture of the distal anterior calcaneus at the calcaneocuboid joint. 6. No evidence of osteomyelitis. Electronically Signed   By: Kathreen Devoid   On: 03/30/2017 16:29   Mr Foot Right W Wo Contrast  Result Date: 04/07/2017 CLINICAL DATA:  Right foot cellulitis. Evaluate for abscess or osteomyelitis. EXAM: MRI OF THE RIGHT FOREFOOT WITHOUT AND WITH CONTRAST TECHNIQUE: Multiplanar, multisequence MR imaging of the right forefoot was performed before and after the administration of intravenous contrast. CONTRAST:  57mL MULTIHANCE GADOBENATE DIMEGLUMINE 529 MG/ML IV SOLN COMPARISON:  Right foot x-rays dated April 06, 2017. Right foot MRI dated March 30, 2017. FINDINGS: Bones/Joint/Cartilage Unchanged nondisplaced fracture at the lateral base of the first proximal phalanx. Unchanged nondisplaced fracture of the distal shaft of the first metatarsal, second metatarsal neck and third metatarsal head. Unchanged nondisplaced fracture of the anterior calcaneus adjacent to  the calcaneocuboid joint. No periosteal reaction or cortical destruction. Normal alignment. No joint effusion. Ligaments Collateral ligaments are intact.  Lisfranc ligament is intact. Muscles and Tendons Flexor, peroneal and extensor compartment tendons are intact. Mild increased STIR signal within the intrinsic muscles of the forefoot, likely related to diabetic muscle changes. Soft tissue New ill-defined, rim enhancing fluid collection over the dorsum of the lateral foot, extending from the MTP joints to the midfoot. The fluid collection measures approximately 8.6 x 2.3 x 1.6 cm (AP by transverse by CC). This communicates with the overlying skin surface. No soft tissue mass. IMPRESSION: 1. Ill-defined, rim enhancing 8.6 cm fluid collection over the dorsum of the lateral foot, extending from the MTP joints to the midfoot, consistent with abscess. This appears to drain to the overlying skin surface. 2. No evidence of osteomyelitis. 3. Unchanged nondisplaced fractures of the first proximal phalanx, distal first metatarsal shaft, second metatarsal neck, third metatarsal head, and distal anterior calcaneus near the calcaneocuboid joint. Electronically Signed   By: Titus Dubin M.D.   On: 04/07/2017 10:47   Dg Chest Port 1 View  Result Date: 04/09/2017 CLINICAL DATA:  84 year old female status post PICC line placement. EXAM: PORTABLE CHEST 1 VIEW COMPARISON:  03/19/2017 and earlier. FINDINGS: Portable AP upright view at 2214 hrs. A right side PICC line catheter has been placed, but loops in the right subclavian region and is directed retrograde into the right axilla. The tip is also looped onto itself in the axillary region. Stable lung volumes. Stable mediastinal contours. No acute pulmonary opacity. Paucity of bowel gas in the upper abdomen. IMPRESSION: 1. Right PICC line courses centrally to the right subclavian vein level, but then loops peripherally into the right axilla. This should be repositioned. 2.  No  acute cardiopulmonary abnormality. Electronically Signed   By:  Genevie Ann M.D.   On: 04/09/2017 22:32   Dg Foot 2 Views Right  Result Date: 03/26/2017 CLINICAL DATA:  History of a fall 8 days ago resulting in lacerations over the dorsum of the foot requiring stitches. Patient also had an acute fracture of the base of the proximal phalanx of the great toe. The patient now is complaining of increased foot pain, erythema, and swelling. Clinical suspicion of cellulitis. EXAM: RIGHT FOOT - 2 VIEW COMPARISON:  Right foot series of March 18, 2017 FINDINGS: The displaced fracture through the lateral aspect of the base of the fifth metatarsal is again demonstrated and appears stable. The second metatarsal neck fracture is not well demonstrated. The other phalanges and metatarsals are intact. The tarsal bones exhibit no acute abnormalities. There is mild soft tissue swelling over the forefoot and midfoot. No soft tissue gas collections or foreign bodies are observed. IMPRESSION: Mild soft tissue swelling may reflect cellulitis. No plain film evidence of osteomyelitis. There are post traumatic changes of the base of the proximal phalanx of the great toe and possibly of the neck of the second metatarsal as described. Electronically Signed   By: David  Martinique M.D.   On: 03/26/2017 12:37   Dg Foot Complete Right  Result Date: 04/06/2017 Please see detailed radiograph report in office note.   Assessment/Plan Dementia with psychosis  Continue Seroquel 25 mg po Q HS  Monitor for safety  Assist with ADLs as appropriate  Cellulitis of right foot  Continue Ceftriaxone 1 G IV Q Day  Continue PICC Care per protocol  Ankle fracture, right, closed, with routine healing, subsequent encounter  Continue PT/OT  Continue exercises as taught by PT/OT  Continue APAP CR 1,300 mg po BID  Continue Tramadol 50 mg 1-2 tablets po Q 4 hours prn  Follow up with Ortho as intructed  Family/ staff Communication:   Total  Time:  Documentation:  Face to Face:  Family/Phone:   Labs/tests ordered:    Medication list reviewed and assessed for continued appropriateness. Monthly medication orders reviewed and signed.  Vikki Ports, NP-C Geriatrics [facility] [phone], [place] [address] Cell Phone (Mon-Fri 8am-5pm):  [phone] On Call:  [phone] & follow prompts after 5pm & weekends Office Phone:  [phone] Office Fax:  [phone]

## 2017-04-25 ENCOUNTER — Encounter: Payer: Self-pay | Admitting: Emergency Medicine

## 2017-04-25 ENCOUNTER — Emergency Department
Admission: EM | Admit: 2017-04-25 | Discharge: 2017-04-25 | Disposition: A | Discharge status: Home / Self Care | Payer: PPO | Attending: Emergency Medicine | Admitting: Emergency Medicine

## 2017-04-25 DIAGNOSIS — R1111 Vomiting without nausea: Secondary | ICD-10-CM | POA: Insufficient documentation

## 2017-04-25 DIAGNOSIS — Z79899 Other long term (current) drug therapy: Secondary | ICD-10-CM | POA: Insufficient documentation

## 2017-04-25 DIAGNOSIS — Z853 Personal history of malignant neoplasm of breast: Secondary | ICD-10-CM | POA: Diagnosis not present

## 2017-04-25 DIAGNOSIS — R55 Syncope and collapse: Secondary | ICD-10-CM | POA: Diagnosis not present

## 2017-04-25 DIAGNOSIS — R1110 Vomiting, unspecified: Secondary | ICD-10-CM | POA: Diagnosis not present

## 2017-04-25 DIAGNOSIS — L02619 Cutaneous abscess of unspecified foot: Secondary | ICD-10-CM | POA: Diagnosis not present

## 2017-04-25 DIAGNOSIS — Z7982 Long term (current) use of aspirin: Secondary | ICD-10-CM | POA: Insufficient documentation

## 2017-04-25 DIAGNOSIS — I10 Essential (primary) hypertension: Secondary | ICD-10-CM | POA: Diagnosis not present

## 2017-04-25 DIAGNOSIS — L03115 Cellulitis of right lower limb: Secondary | ICD-10-CM | POA: Diagnosis not present

## 2017-04-25 DIAGNOSIS — S82891D Other fracture of right lower leg, subsequent encounter for closed fracture with routine healing: Secondary | ICD-10-CM | POA: Diagnosis not present

## 2017-04-25 DIAGNOSIS — Z792 Long term (current) use of antibiotics: Secondary | ICD-10-CM | POA: Diagnosis not present

## 2017-04-25 LAB — BASIC METABOLIC PANEL
Anion gap: 11 (ref 5–15)
BUN: 18 mg/dL (ref 6–20)
CHLORIDE: 105 mmol/L (ref 101–111)
CO2: 22 mmol/L (ref 22–32)
Calcium: 8.9 mg/dL (ref 8.9–10.3)
Creatinine, Ser: 0.88 mg/dL (ref 0.44–1.00)
GFR calc Af Amer: >60 mL/min (ref >60)
GFR calc non Af Amer: 59 mL/min — ABNORMAL LOW (ref >60)
GLUCOSE: 145 mg/dL — AB (ref 65–99)
POTASSIUM: 3.8 mmol/L (ref 3.5–5.1)
Sodium: 138 mmol/L (ref 135–145)

## 2017-04-25 LAB — TROPONIN I: Troponin I: <0.03 ng/mL (ref <0.03)

## 2017-04-25 LAB — URINALYSIS, COMPLETE (UACMP) WITH MICROSCOPIC
BACTERIA UA: NONE SEEN
Bilirubin Urine: NEGATIVE
Glucose, UA: NEGATIVE mg/dL
HGB URINE DIPSTICK: NEGATIVE
Ketones, ur: NEGATIVE mg/dL
LEUKOCYTES UA: NEGATIVE
Nitrite: NEGATIVE
PROTEIN: NEGATIVE mg/dL
Specific Gravity, Urine: 1.006 (ref 1.005–1.030)
pH: 6 (ref 5.0–8.0)

## 2017-04-25 LAB — CBC
HEMATOCRIT: 35.7 % (ref 35.0–47.0)
Hemoglobin: 11.9 g/dL — ABNORMAL LOW (ref 12.0–16.0)
MCH: 37.7 pg — ABNORMAL HIGH (ref 26.0–34.0)
MCHC: 33.4 g/dL (ref 32.0–36.0)
MCV: 113.2 fL — AB (ref 80.0–100.0)
Platelets: 303 10*3/uL (ref 150–440)
RBC: 3.16 MIL/uL — ABNORMAL LOW (ref 3.80–5.20)
RDW: 16.5 % — AB (ref 11.5–14.5)
WBC: 8.9 10*3/uL (ref 3.6–11.0)

## 2017-04-25 MED ORDER — SODIUM CHLORIDE 0.9 % IV BOLUS (SEPSIS)
500.0000 mL | Freq: Once | INTRAVENOUS | Status: AC
  Administered 2017-04-25: 500 mL via INTRAVENOUS

## 2017-04-25 NOTE — ED Notes (Signed)
Pt discharged home after verbalizing understanding of discharge instructions; nad noted. 

## 2017-04-25 NOTE — ED Triage Notes (Signed)
Patient presents to ED via POV from [facility]. Patient was seen for a wound infection to right foot. When patient was leaving she got very hot and had a witnessed syncopal event and then vomited. Patient is A&O x4 at this time, patient reports generalized weakness.

## 2017-04-25 NOTE — ED Provider Notes (Signed)
[facility] Emergency Department Provider Note  ____________________________________________   First MD Initiated Contact with Patient 04/25/17 1017     (approximate)  I have reviewed the triage vital signs and the nursing notes.   HISTORY  Chief Complaint Loss of Consciousness   HPI [name] [name] is a 84 y.o. female with a history of hypertension was presenting after a syncopal episode.  Patient says that she did not fully syncopized but bystanders report that the patient did fully lose consciousness.  The patient says that she was in a wheelchair when she began feeling very hot.  She denies any chest pain, shortness of breath or palpitations.  She then vomited.  Patient was transported to the emergency department thereafter.  She is denying nausea at this time.  Says that she has not had any recent nausea, vomiting or diarrhea.  Says that she has been eating and drinking normally but she is a coming by a friend who says that her portion size has been reduced last time sorry this past Sunday.  Patient persistently denies any chest pain or shortness of breath.  Patient was being seen this morning for a wound check of her right foot.  Says that the wound check went well and the wound appears to be healing.  Patient denies any fever recently. Past Medical History:  Diagnosis Date  . breast cancer   . Fibrocystic breast disease    Severe; status post bilateral mastectomies and implants  . GERD (gastroesophageal reflux disease)    Severe; manifested vocal cord irritation, status-post Nissen fundcoplication in 10/7671  . History of ataxia    with questionable right upper and lower  . History of breast cancer   . History of exertional chest pain    with her last stress echo in 09/1999 being normal  . History of migraine    vascular  . History of shingles   . History of syncope 12/1999   With negative cardiac workup to include an echocardiogram, which revealed  only mild to moderate MR, a CT scan of the chest, which was negative, and a Cardiolite stress test, which was normal  . Hyperlipidemia, unspecified   . Hypertension    CTA 05/2004 showed no renovascular cause  . Mild dementia   . Muscle atrophy    Positive FANA and anti- SCL70 antibodies; S/p biopsy without eosinic fascitis; Remicade discontinued  . Osteoarthritis    a. Cervical spine. b. Lumbar spine/spinal stenosis. [facility])  . Osteoporosis    a. Fosamax b. Right wrist fracture. c. Sternal fracture ([place])  . Rheumatoid arthritis without elevated rheumatoid factor (HCC)    a. Methotrexate. b. S/p Remicade. c. Seronegative. d. S/p Plaquenil. ([facility])    Patient Active Problem List   Diagnosis Date Noted  . Foot abscess 04/06/2017  . Ankle fracture, right, closed, with routine healing, subsequent encounter 03/26/2017  . Cellulitis of right foot 03/26/2017  . Osteoarthritis 07/03/2016  . Rheumatoid arthritis without elevated rheumatoid factor ([place]) 07/03/2016  . Pulmonary HTN ([place]) 06/19/2016  . Chest pain 11/09/2015  . GERD (gastroesophageal reflux disease) 10/06/2013  . HTN (hypertension) 10/06/2013  . Hyperlipidemia, unspecified 10/06/2013  . Osteoporosis 10/06/2013  . Lumbar radiculitis 07/08/2013  . Lumbar spondylosis 07/08/2013  . Lumbar stenosis with neurogenic claudication 07/08/2013    Past Surgical History:  Procedure Laterality Date  . APPENDECTOMY    . CHOLECYSTECTOMY    . IRRIGATION AND DEBRIDEMENT ABSCESS Right 04/08/2017   Procedure: IRRIGATION AND DEBRIDEMENT ABSCESS  with wound vac;  Surgeon: Albertine Patricia, DPM;  Location: [facility] ORS;  Service: Podiatry;  Laterality: Right;  . LAPAROSCOPIC NISSEN FUNDOPLICATION  88/4166  . LUMBAR LAMINECTOMY     for spinal stenosis  . MASTECTOMY Bilateral    with implants  . ORIF DISTAL RADIUS FRACTURE Right 07/27/2008   with volar plate  . TOTAL ABDOMINAL HYSTERECTOMY W/ BILATERAL SALPINGOOPHORECTOMY    . VASCULAR SURGERY       Prior to Admission medications   Medication Sig Start Date End Date Taking? Authorizing Provider  acetaminophen (TYLENOL) 650 MG CR tablet Take 1,300 mg by mouth 2 (two) times daily.     [provider]  amLODipine (NORVASC) 10 MG tablet Take 10 mg by mouth daily.    [provider]  Ascorbic Acid (VITAMIN C) 1000 MG tablet Take 1,000 mg by mouth daily.    [provider]  aspirin EC 81 MG tablet Take 81 mg by mouth daily.    [provider]  cefTRIAXone 1 g in sodium chloride 0.9 % 100 mL Inject 1 g into the vein daily. 04/11/17   Salary, Avel Peace, MD  docusate sodium (COLACE) 100 MG capsule Take 1 capsule (100 mg total) by mouth 2 (two) times daily. 04/10/17   Salary, Avel Peace, MD  folic acid (FOLVITE) 1 MG tablet Take 1 mg by mouth daily.    [provider]  heparin flush 10 UNIT/ML SOLN injection Inject 5 mLs into the vein as needed. Flush PICC line using SASH (Saline, Antibiotic, Saline, Heparin) method with each intermittent administration of medication. If not being used, flush every 24 hours with saline followed by heparin flush.    [provider]  isosorbide mononitrate (IMDUR) 30 MG 24 hr tablet Take 30 mg by mouth daily.     [provider]  losartan (COZAAR) 100 MG tablet Take 100 mg by mouth daily.     [provider]  methotrexate (RHEUMATREX) 2.5 MG tablet Take 6 tablets by mouth every Friday.  10/28/15   [provider]  metoprolol (LOPRESSOR) 100 MG tablet Take 100 mg by mouth 2 (two) times daily.     [provider]  mirtazapine (REMERON) 7.5 MG tablet Take 7.5 mg by mouth at bedtime.    [provider]  Multiple Vitamin (MULTI-VITAMINS) TABS Take 1 tablet by mouth daily.    [provider]  QUEtiapine (SEROQUEL) 25 MG tablet Take 25 mg by mouth daily.    [provider]  sertraline (ZOLOFT) 100 MG tablet Take 100 mg by mouth daily.    [provider]   sodium chloride 0.9 % injection Inject 10 mLs into the vein daily as needed. Flush PICC line using SASH (Saline, Antibiotic, Saline, Heparin)method with each intermittent administration of medication. If not being used, flush every 24 hours with saline followed by heparin flush.    [provider]  traMADol (ULTRAM) 50 MG tablet Take 1 tablet (50 mg total) by mouth every 6 (six) hours as needed for moderate pain. 04/12/17   Toni Arthurs, NP    Allergies Atorvastatin; Bisphosphonates; Codeine; Memantine; Omeprazole; Other; Salsalate; Sulfa antibiotics; and Penicillins  Family History  Problem Relation Age of Onset  . Heart failure Mother   . Hypertension Mother   . Cancer Father   . Heart attack Sister     Social History Social History   Tobacco Use  . Smoking status: Never Smoker  . Smokeless tobacco: Never Used  Substance Use  Topics  . Alcohol use: No  . Drug use: No    Review of Systems  Constitutional: No fever/chills Eyes: No visual changes. ENT: No sore throat. Cardiovascular: Denies chest pain. Respiratory: Denies shortness of breath. Gastrointestinal: No abdominal pain.   No diarrhea.  No constipation. Genitourinary: Negative for dysuria. Musculoskeletal: Negative for back pain. Skin: Negative for rash. Neurological: Negative for headaches, focal weakness or numbness.   ____________________________________________   PHYSICAL EXAM:  VITAL SIGNS: ED Triage Vitals  Enc Vitals Group     BP 04/25/17 0946 (!) 91/43     Pulse Rate 04/25/17 0946 (!) 56     Resp 04/25/17 0944 16     Temp 04/25/17 0944 98.1 F (36.7 C)     Temp Source 04/25/17 0944 Oral     SpO2 04/25/17 0946 96 %     Weight 04/25/17 0945 137 lb (62.1 kg)     Height 04/25/17 0945 5\' 3"  (1.6 m)     Head Circumference --      Peak Flow --      Pain Score 04/25/17 0945 0     Pain Loc --      Pain Edu? --      Excl. in [place]? --     Constitutional: Alert and oriented. Well appearing  and in no acute distress. Eyes: Conjunctivae are normal.  Head: Atraumatic. Nose: No congestion/rhinnorhea. Mouth/Throat: Mucous membranes are moist.  Neck: No stridor.   Cardiovascular: Normal rate, regular rhythm. Grossly normal heart sounds.   Respiratory: Normal respiratory effort.  No retractions. Lungs CTAB. Gastrointestinal: Soft and nontender. No distention.  Musculoskeletal: No lower extremity tenderness nor edema.  No joint effusions.  Right foot with gauze and Ace wrap.  Bandages are CDI. Neurologic:  Normal speech and language. No gross focal neurologic deficits are appreciated. Skin:  Skin is warm, dry and intact. No rash noted. Psychiatric: Mood and affect are normal. Speech and behavior are normal.  ____________________________________________   LABS (all labs ordered are listed, but only abnormal results are displayed)  Labs Reviewed  BASIC METABOLIC PANEL - Abnormal; Notable for the following components:      Result Value   Glucose, Bld 145 (*)    GFR calc non Af Amer 59 (*)    All other components within normal limits  CBC - Abnormal; Notable for the following components:   RBC 3.16 (*)    Hemoglobin 11.9 (*)    MCV 113.2 (*)    MCH 37.7 (*)    RDW 16.5 (*)    All other components within normal limits  URINALYSIS, COMPLETE (UACMP) WITH MICROSCOPIC  TROPONIN I  TROPONIN I  CBG MONITORING, ED   ____________________________________________  EKG  ED ECG REPORT I, Doran Stabler, the attending physician, personally viewed and interpreted this ECG.   Date: 04/25/2017  EKG Time: 0954  Rate: 55  Rhythm: Sinus bradycardia  Axis: Normal  Intervals:none  ST&T Change: No ST segment elevation or depression.  No abnormal T wave inversion.  ____________________________________________  RADIOLOGY   ____________________________________________   PROCEDURES  Procedure(s) performed:   Procedures  Critical Care performed:    ____________________________________________   INITIAL IMPRESSION / ASSESSMENT AND PLAN / ED COURSE  Pertinent labs & imaging results that were available during my care of the patient were reviewed by me and considered in my medical decision making (see chart for details).  DDX: Arrhythmia, syncope, near syncope, nausea, vomiting, vasovagal event, electrolyte abnormality, UTI, dehydration As part of  my medical decision making, I reviewed the following data within the electronic MEDICAL RECORD NUMBER Notes from prior ED visits     ----------------------------------------- 1:27 PM on 04/25/2017 -----------------------------------------  Patient at this time tolerating p.o. fluids as well as crackers.  Denies any complaints.  Likely vasovagal episode.  Patient will be discharged at this time.  Reassuring lab work.  Was not able to give urine specimen but does not have any burning with urination.  No abdominal pain or tenderness. ____________________________________________   FINAL CLINICAL IMPRESSION(S) / ED DIAGNOSES  Vomiting.  Syncope.    NEW MEDICATIONS STARTED DURING THIS VISIT:  New Prescriptions   No medications on file     Note:  This document was prepared using Dragon voice recognition software and may include unintentional dictation errors.     Orbie Pyo, MD 04/25/17 1328

## 2017-04-25 NOTE — ED Notes (Signed)
First Nurse Note:  Patient to ED from [facility] via [place].  Saw Dr. Ola Spurr for a wound infection on her right foot this AM.  Bus driver was taking patient out to bus when she became unresponsive and vomited X 2.  Patient alert, color pale.  Patient states "I just felt hotter and hotter".  States she feels weak now.

## 2017-04-25 NOTE — ED Notes (Signed)
This RN spoke with Velva Harman, RN at [facility] will be sending someone to pick patient up.

## 2017-04-26 ENCOUNTER — Other Ambulatory Visit
Admission: RE | Admit: 2017-04-26 | Discharge: 2017-04-26 | Disposition: A | Discharge status: Home / Self Care | Payer: PPO | Source: Ambulatory Visit | Attending: Internal Medicine | Admitting: Internal Medicine

## 2017-04-26 DIAGNOSIS — L03115 Cellulitis of right lower limb: Secondary | ICD-10-CM | POA: Insufficient documentation

## 2017-04-26 LAB — CBC WITH DIFFERENTIAL/PLATELET
Basophils Absolute: 0.1 10*3/uL (ref 0–0.1)
Basophils Relative: 1 %
Eosinophils Absolute: 0.4 10*3/uL (ref 0–0.7)
Eosinophils Relative: 5 %
HCT: 33.4 % — ABNORMAL LOW (ref 35.0–47.0)
Hemoglobin: 11.1 g/dL — ABNORMAL LOW (ref 12.0–16.0)
Lymphocytes Relative: 16 %
Lymphs Abs: 1.4 10*3/uL (ref 1.0–3.6)
MCH: 37.7 pg — ABNORMAL HIGH (ref 26.0–34.0)
MCHC: 33.1 g/dL (ref 32.0–36.0)
MCV: 114 fL — ABNORMAL HIGH (ref 80.0–100.0)
Monocytes Absolute: 1.2 10*3/uL — ABNORMAL HIGH (ref 0.2–0.9)
Monocytes Relative: 15 %
Neutro Abs: 5.4 10*3/uL (ref 1.4–6.5)
Neutrophils Relative %: 63 %
Platelets: 273 10*3/uL (ref 150–440)
RBC: 2.93 MIL/uL — ABNORMAL LOW (ref 3.80–5.20)
RDW: 16.3 % — ABNORMAL HIGH (ref 11.5–14.5)
WBC: 8.5 10*3/uL (ref 3.6–11.0)

## 2017-04-26 LAB — COMPREHENSIVE METABOLIC PANEL
ALT: 17 U/L (ref 14–54)
AST: 25 U/L (ref 15–41)
Albumin: 3.3 g/dL — ABNORMAL LOW (ref 3.5–5.0)
Alkaline Phosphatase: 59 U/L (ref 38–126)
Anion gap: 9 (ref 5–15)
BUN: 16 mg/dL (ref 6–20)
CO2: 27 mmol/L (ref 22–32)
Calcium: 8.8 mg/dL — ABNORMAL LOW (ref 8.9–10.3)
Chloride: 106 mmol/L (ref 101–111)
Creatinine, Ser: 0.75 mg/dL (ref 0.44–1.00)
GFR calc Af Amer: >60 mL/min (ref >60)
GFR calc non Af Amer: >60 mL/min (ref >60)
Glucose, Bld: 85 mg/dL (ref 65–99)
Potassium: 3 mmol/L — ABNORMAL LOW (ref 3.5–5.1)
Sodium: 142 mmol/L (ref 135–145)
Total Bilirubin: 0.5 mg/dL (ref 0.3–1.2)
Total Protein: 6.4 g/dL — ABNORMAL LOW (ref 6.5–8.1)

## 2017-04-26 LAB — C-REACTIVE PROTEIN: CRP: <0.8 mg/dL (ref <1.0)

## 2017-04-27 ENCOUNTER — Encounter: Payer: Self-pay | Admitting: Gerontology

## 2017-04-27 ENCOUNTER — Encounter
Admission: RE | Admit: 2017-04-27 | Discharge: 2017-04-27 | Disposition: A | Discharge status: Home / Self Care | Payer: PPO | Source: Ambulatory Visit | Attending: Internal Medicine | Admitting: Internal Medicine

## 2017-04-27 ENCOUNTER — Non-Acute Institutional Stay (SKILLED_NURSING_FACILITY): Payer: PPO | Admitting: Gerontology

## 2017-04-27 DIAGNOSIS — S82891D Other fracture of right lower leg, subsequent encounter for closed fracture with routine healing: Secondary | ICD-10-CM | POA: Diagnosis not present

## 2017-04-27 DIAGNOSIS — L03115 Cellulitis of right lower limb: Secondary | ICD-10-CM | POA: Diagnosis not present

## 2017-04-27 DIAGNOSIS — F0392 Unspecified dementia, unspecified severity, with psychotic disturbance: Secondary | ICD-10-CM

## 2017-04-27 DIAGNOSIS — F0391 Unspecified dementia with behavioral disturbance: Secondary | ICD-10-CM

## 2017-04-27 NOTE — Progress Notes (Signed)
Location:   [facility] Room Number: [place] of Service:  SNF [phone]) Provider:  Toni Arthurs, NP-C  Sparks, [name], MD  [name], MD as PCP - General (Internal Medicine)  Extended Emergency Contact Information Primary Emergency Contact: [name],[name] Address: [place] Phone: [phone] Work Phone: [phone] Relation: Sister Secondary Emergency Contact: [employer] Mobile Phone: [phone] Relation: Niece  Code Status:  DNR Goals of care: Advanced Directive information Advanced Directives 04/27/2017  Does Patient Have a Medical Advance Directive? Yes  Type of Paramedic of [place];Out of facility DNR (pink MOST or yellow form)  Does patient want to make changes to medical advance directive? No - Patient declined  Copy of [place] in Chart? Yes  Would patient like information on creating a medical advance directive? -     Chief Complaint  Patient presents with  . Medical Management of Chronic Issues    Routine Visit    HPI:  Pt is a 84 y.o. female seen today for medical management of chronic diseases. Pt at the facility for rehab following hospitalization for right ankle fracture, development of cellulitis and abscess with subsequent I&D. Pt had completed the initial coarse of IV Ceftriaxone for the cellulitis, but ID MD ordered to continue giving. She is mobile on the unit with a rolling walker. Woundvac continues on the right foot. No more episodes of psychosis or combativeness. Does attempt to refuse care at times and is irritable with staff, especially at night. Pt reports her foot hurts occasionally. Otherwise, she is doing well. She reports she is eating fairly well, voiding well and having regular BMs. VSS. No other complaints.     Please note pt with limited verbal ability. Unable to obtain complete ROS.  Some ROS info obtained from staff and documentation.    Past Medical History:  Diagnosis Date  . breast cancer   . DVT (deep venous thrombosis) ([place]) 06/2005   RLE ; treated x 3 months with Coumadin  . Fibrocystic breast disease    Severe; status post bilateral mastectomies and implants  . GERD (gastroesophageal reflux disease)    Severe; manifested vocal cord irritation, status-post Nissen fundcoplication in 06/6385  . History of ataxia    with questionable right upper and lower  . History of breast cancer   . History of exertional chest pain    with her last stress echo in 09/1999 being normal  . History of migraine    vascular  . History of shingles   . History of syncope 12/1999   With negative cardiac workup to include an echocardiogram, which revealed only mild to moderate MR, a CT scan of the chest, which was negative, and a Cardiolite stress test, which was normal  . Hyperlipidemia, unspecified   . Hypertension    CTA 05/2004 showed no renovascular cause  . Mild dementia   . Muscle atrophy    Positive FANA and anti- SCL70 antibodies; S/p biopsy without eosinic fascitis; Remicade discontinued  . Osteoarthritis    a. Cervical spine. b. Lumbar spine/spinal stenosis. [facility])  . Osteoporosis    a. Fosamax b. Right wrist fracture. c. Sternal fracture ([place])  . Rheumatoid arthritis without elevated rheumatoid factor (HCC)    a. Methotrexate. b. S/p Remicade. c. Seronegative. d. S/p Plaquenil. [facility])   Past Surgical History:  Procedure Laterality Date  .  APPENDECTOMY    . CHOLECYSTECTOMY    . IRRIGATION AND DEBRIDEMENT ABSCESS Right 04/08/2017   Procedure: IRRIGATION AND DEBRIDEMENT ABSCESS with wound vac;  Surgeon: Albertine Patricia, DPM;  Location: [facility] ORS;  Service: Podiatry;  Laterality: Right;  . LAPAROSCOPIC NISSEN FUNDOPLICATION  56/3875  . LUMBAR LAMINECTOMY     for spinal stenosis  . MASTECTOMY Bilateral    with implants  . ORIF DISTAL RADIUS FRACTURE Right 07/27/2008     with volar plate  . TOTAL ABDOMINAL HYSTERECTOMY W/ BILATERAL SALPINGOOPHORECTOMY    . VASCULAR SURGERY      Allergies  Allergen Reactions  . Atorvastatin Other (See Comments)    Myositis  . Bisphosphonates Other (See Comments)    GI upset  . Codeine Other (See Comments)  . Memantine Other (See Comments)  . Nsaids Other (See Comments)    GI upset  . Omeprazole Nausea Only  . Other Other (See Comments)    Leg cramps GI upset  . Salsalate Other (See Comments)  . Sulfa Antibiotics     Pt doesn't remember  . Tamoxifen Other (See Comments)    Leg cramps  . Penicillins Itching    Has patient had a PCN reaction causing immediate rash, facial/tongue/throat swelling, SOB or lightheadedness with hypotension: Yes Has patient had a PCN reaction causing severe rash involving mucus membranes or skin necrosis: No Has patient had a PCN reaction that required hospitalization: No Has patient had a PCN reaction occurring within the last 10 years: No If all of the above answers are "NO", then may proceed with Cephalosporin use.    Allergies as of 04/27/2017      Reactions   Atorvastatin Other (See Comments)   Myositis   Bisphosphonates Other (See Comments)   GI upset   Codeine Other (See Comments)   Memantine Other (See Comments)   Nsaids Other (See Comments)   GI upset   Omeprazole Nausea Only   Other Other (See Comments)   Leg cramps GI upset   Salsalate Other (See Comments)   Sulfa Antibiotics    Pt doesn't remember   Tamoxifen Other (See Comments)   Leg cramps   Penicillins Itching   Has patient had a PCN reaction causing immediate rash, facial/tongue/throat swelling, SOB or lightheadedness with hypotension: Yes Has patient had a PCN reaction causing severe rash involving mucus membranes or skin necrosis: No Has patient had a PCN reaction that required hospitalization: No Has patient had a PCN reaction occurring within the last 10 years: No If all of the above answers are  "NO", then may proceed with Cephalosporin use.      Medication List        Accurate as of 04/27/17  2:39 PM. Always use your most recent med list.          acetaminophen 650 MG CR tablet Commonly known as:  TYLENOL Take 1,300 mg by mouth 2 (two) times daily.   amLODipine 10 MG tablet Commonly known as:  NORVASC Take 10 mg by mouth daily.   aspirin EC 81 MG tablet Take 81 mg by mouth daily.   cefTRIAXone 1 g in sodium chloride 0.9 % 100 mL Inject 1 g into the vein daily.   docusate sodium 100 MG capsule Commonly known as:  COLACE Take 1 capsule (100 mg total) by mouth 2 (two) times daily.   folic acid 1 MG tablet Commonly known as:  FOLVITE Take 1 mg by mouth daily.   heparin flush 10 UNIT/ML  Soln injection Inject 5 mLs into the vein as needed. Flush PICC line using SASH (Saline, Antibiotic, Saline, Heparin) method with each intermittent administration of medication. If not being used, flush every 24 hours with saline followed by heparin flush.   isosorbide mononitrate 30 MG 24 hr tablet Commonly known as:  IMDUR Take 30 mg by mouth daily.   losartan 100 MG tablet Commonly known as:  COZAAR Take 100 mg by mouth daily.   methotrexate 2.5 MG tablet Commonly known as:  RHEUMATREX Take 6 tablets by mouth every Friday.   metoprolol tartrate 100 MG tablet Commonly known as:  LOPRESSOR Take 100 mg by mouth 2 (two) times daily.   mirtazapine 7.5 MG tablet Commonly known as:  REMERON Take 7.5 mg by mouth at bedtime.   MULTI-VITAMINS Tabs Take 1 tablet by mouth daily.   QUEtiapine 25 MG tablet Commonly known as:  SEROQUEL Take 25 mg by mouth daily.   sertraline 100 MG tablet Commonly known as:  ZOLOFT Take 100 mg by mouth daily.   traMADol 50 MG tablet Commonly known as:  ULTRAM Take 50-100 mg by mouth every 4 (four) hours as needed for moderate pain or severe pain. 1 tab for moderate pain 2 tabs for severe pain   vitamin C 1000 MG tablet Take 1,000 mg by  mouth daily.       Review of Systems  Unable to perform ROS: Dementia  Constitutional: Negative for activity change, appetite change, chills, diaphoresis and fever.  HENT: Negative for congestion, mouth sores, nosebleeds, postnasal drip, sneezing, sore throat, trouble swallowing and voice change.   Respiratory: Negative for apnea, cough, choking, chest tightness, shortness of breath and wheezing.   Cardiovascular: Negative for chest pain, palpitations and leg swelling.  Gastrointestinal: Negative for abdominal distention, abdominal pain, constipation, diarrhea and nausea.  Genitourinary: Negative for difficulty urinating, dysuria, frequency and urgency.  Musculoskeletal: Positive for arthralgias (typical arthritis) and myalgias. Negative for back pain and gait problem.  Skin: Positive for wound. Negative for color change, pallor and rash.  Neurological: Positive for weakness. Negative for dizziness, tremors, syncope, speech difficulty, numbness and headaches.  Psychiatric/Behavioral: Positive for agitation and confusion. Negative for behavioral problems.  All other systems reviewed and are negative.   Immunization History  Administered Date(s) Administered  . Influenza-Unspecified 11/27/2013  . Pneumococcal Polysaccharide-23 03/27/2013   Pertinent  Health Maintenance Due  Topic Date Due  . DEXA SCAN  04/28/1997  . PNA vac Low Risk Adult (2 of 2 - PCV13) 03/27/2014  . INFLUENZA VACCINE  09/27/2016   No flowsheet data found. Functional Status Survey:    Vitals:   04/27/17 1420  BP: (!) 148/86  Pulse: 65  Resp: 18  Temp: 98.6 F (37 C)  TempSrc: Oral  SpO2: 95%  Weight: 138 lb (62.6 kg)  Height: 5\' 3"  (1.6 m)   Body mass index is 24.45 kg/m. Physical Exam  Constitutional: Vital signs are normal. She appears well-developed and well-nourished. She is active and cooperative. She does not appear ill. No distress.  HENT:  Head: Normocephalic and atraumatic.  Mouth/Throat:  Uvula is midline, oropharynx is clear and moist and mucous membranes are normal. Mucous membranes are not pale, not dry and not cyanotic.  Eyes: Pupils are equal, round, and reactive to light. Conjunctivae, EOM and lids are normal.  Neck: Trachea normal, normal range of motion and full passive range of motion without pain. Neck supple. No JVD present. No tracheal deviation, no edema and no erythema  present. No thyromegaly present.  Cardiovascular: Normal rate, regular rhythm, normal heart sounds, intact distal pulses and normal pulses. Exam reveals no gallop, no distant heart sounds and no friction rub.  No murmur heard. Pulses:      Dorsalis pedis pulses are 2+ on the right side, and 2+ on the left side.  No edema  Pulmonary/Chest: Effort normal and breath sounds normal. No accessory muscle usage. No respiratory distress. She has no decreased breath sounds. She has no wheezes. She has no rhonchi. She has no rales. She exhibits no tenderness.  Abdominal: Soft. Normal appearance and bowel sounds are normal. She exhibits no distension and no ascites. There is no tenderness.  Musculoskeletal: She exhibits no edema.       Right ankle: She exhibits decreased range of motion. Tenderness.  Expected osteoarthritis, stiffness; Bilateral Calves soft, supple. Negative Homan's Sign. B- pedal pulses equal;right ankle fracture  Neurological: She is alert. She has normal strength. Coordination and gait abnormal.  Skin: Skin is warm, dry and intact. Ecchymosis noted. She is not diaphoretic. No cyanosis. No pallor. Nails show no clubbing.  Right foot cellulitis, abscess s/p I&D. Woundvac in place  Psychiatric: Her speech is normal. Her mood appears anxious. She is agitated. Thought content is delusional. Cognition and memory are impaired. She expresses impulsivity and inappropriate judgment. She exhibits abnormal recent memory.  Nursing note and vitals reviewed.   Labs reviewed: Recent Labs    04/19/17 0440  04/25/17 0944 04/26/17 0610  NA 143 138 142  K 3.5 3.8 3.0*  CL 107 105 106  CO2 23 22 27   GLUCOSE 76 145* 85  BUN 22* 18 16  CREATININE 0.87 0.88 0.75  CALCIUM 9.1 8.9 8.8*   Recent Labs    04/12/17 0915 04/19/17 0440 04/26/17 0610  AST 24 22 25   ALT 17 15 17   ALKPHOS 68 56 59  BILITOT 0.6 0.6 0.5  PROT 6.9 7.0 6.4*  ALBUMIN 3.3* 3.5 3.3*   Recent Labs    04/12/17 0915 04/19/17 0440 04/25/17 0944 04/26/17 0610  WBC 13.7* 7.7 8.9 8.5  NEUTROABS 11.0* 4.8  --  5.4  HGB 11.5* 11.6* 11.9* 11.1*  HCT 35.6 35.6 35.7 33.4*  MCV 116.2* 114.8* 113.2* 114.0*  PLT 366 265 303 273   Lab Results  Component Value Date   TSH 1.412 06/23/2016   No results found for: HGBA1C No results found for: CHOL, HDL, LDLCALC, LDLDIRECT, TRIG, CHOLHDL  Significant Diagnostic Results in last 30 days:  Mr Foot Right Wo Contrast  Result Date: 03/30/2017 CLINICAL DATA:  Cellulitis of the toe.  Pain. EXAM: MRI OF THE RIGHT FOREFOOT WITHOUT CONTRAST TECHNIQUE: Multiplanar, multisequence MR imaging of the right forefoot was performed. No intravenous contrast was administered. COMPARISON:  None. FINDINGS: Bones/Joint/Cartilage Nondisplaced fracture at the lateral base of the first proximal phalanx with mild surrounding marrow edema. Nondisplaced oblique fracture of the distal shaft of the first metatarsal with surrounding marrow edema. Nondisplaced fracture of the second metatarsal neck with marrow edema in the second metatarsal shaft and second metatarsal head. Nondisplaced fracture of third metatarsal head. Severe marrow edema with a linear component involving the distal anterior calcaneus adjacent to the calcaneocuboid joint partially visualized most consistent with a nondisplaced fracture. No periosteal reaction or bone destruction. Normal alignment. No joint effusion. Ligaments Collateral ligaments are intact.  Lisfranc ligament is intact Muscles and Tendons Flexor, peroneal and extensor compartment  tendons are intact. Muscles are normal. Soft tissue No fluid collection or  hematoma. No soft tissue mass. Soft tissue edema along the dorsal aspect of the foot which may be reactive secondary to venous insufficiency versus cellulitis. IMPRESSION: 1. Nondisplaced fracture at the lateral base of the first proximal phalanx with mild surrounding marrow edema. 2. Nondisplaced oblique fracture of the distal shaft of the first metatarsal with surrounding marrow edema. 3. Nondisplaced fracture of the second metatarsal neck with marrow edema in the second metatarsal shaft and second metatarsal head. 4. Nondisplaced fracture of third metatarsal head. 5. Nondisplaced fracture of the distal anterior calcaneus at the calcaneocuboid joint. 6. No evidence of osteomyelitis. Electronically Signed   By: Kathreen Devoid   On: 03/30/2017 16:29   Mr Foot Right W Wo Contrast  Result Date: 04/07/2017 CLINICAL DATA:  Right foot cellulitis. Evaluate for abscess or osteomyelitis. EXAM: MRI OF THE RIGHT FOREFOOT WITHOUT AND WITH CONTRAST TECHNIQUE: Multiplanar, multisequence MR imaging of the right forefoot was performed before and after the administration of intravenous contrast. CONTRAST:  51mL MULTIHANCE GADOBENATE DIMEGLUMINE 529 MG/ML IV SOLN COMPARISON:  Right foot x-rays dated April 06, 2017. Right foot MRI dated March 30, 2017. FINDINGS: Bones/Joint/Cartilage Unchanged nondisplaced fracture at the lateral base of the first proximal phalanx. Unchanged nondisplaced fracture of the distal shaft of the first metatarsal, second metatarsal neck and third metatarsal head. Unchanged nondisplaced fracture of the anterior calcaneus adjacent to the calcaneocuboid joint. No periosteal reaction or cortical destruction. Normal alignment. No joint effusion. Ligaments Collateral ligaments are intact.  Lisfranc ligament is intact. Muscles and Tendons Flexor, peroneal and extensor compartment tendons are intact. Mild increased STIR signal within  the intrinsic muscles of the forefoot, likely related to diabetic muscle changes. Soft tissue New ill-defined, rim enhancing fluid collection over the dorsum of the lateral foot, extending from the MTP joints to the midfoot. The fluid collection measures approximately 8.6 x 2.3 x 1.6 cm (AP by transverse by CC). This communicates with the overlying skin surface. No soft tissue mass. IMPRESSION: 1. Ill-defined, rim enhancing 8.6 cm fluid collection over the dorsum of the lateral foot, extending from the MTP joints to the midfoot, consistent with abscess. This appears to drain to the overlying skin surface. 2. No evidence of osteomyelitis. 3. Unchanged nondisplaced fractures of the first proximal phalanx, distal first metatarsal shaft, second metatarsal neck, third metatarsal head, and distal anterior calcaneus near the calcaneocuboid joint. Electronically Signed   By: Titus Dubin M.D.   On: 04/07/2017 10:47   Dg Chest Port 1 View  Result Date: 04/09/2017 CLINICAL DATA:  84 year old female status post PICC line placement. EXAM: PORTABLE CHEST 1 VIEW COMPARISON:  03/19/2017 and earlier. FINDINGS: Portable AP upright view at 2214 hrs. A right side PICC line catheter has been placed, but loops in the right subclavian region and is directed retrograde into the right axilla. The tip is also looped onto itself in the axillary region. Stable lung volumes. Stable mediastinal contours. No acute pulmonary opacity. Paucity of bowel gas in the upper abdomen. IMPRESSION: 1. Right PICC line courses centrally to the right subclavian vein level, but then loops peripherally into the right axilla. This should be repositioned. 2.  No acute cardiopulmonary abnormality. Electronically Signed   By: Genevie Ann M.D.   On: 04/09/2017 22:32   Dg Foot Complete Right  Result Date: 04/06/2017 Please see detailed radiograph report in office note.   Assessment/Plan Dementia with psychosis  Continue seroquel 25 mg po Q HS  Assist  with adls as appropriate  Safety precautions  Fall precautions   Redirect as needed  Cellulitis of right foot  Continue Ceftriaxone 1 Gram IV Q Day  Follow up with Dr Ola Spurr as instructed   Ankle fracture, right, closed, with routine healing, subsequent encounter  Assist with ADls and ambulation as appropriate  Ambulate with walker  Elevate foot when at rest  Continue APAP CR 1,300 mg po BID  Continue Tramadol 50 mg 1-2 tablets po Q 4 hours prn pain  Follow up with podiatry as instructed  Family/ staff Communication:   Total Time:  Documentation:  Face to Face:  Family/Phone:   Labs/tests ordered:    Medication list reviewed and assessed for continued appropriateness. Monthly medication orders reviewed and signed.  Vikki Ports, NP-C Geriatrics [facility] [phone] [address] [place],  [address] Cell Phone (Mon-Fri 8am-5pm):  [phone] On Call:  [phone] & follow prompts after 5pm & weekends Office Phone:  [phone] Office Fax:  [phone]

## 2017-04-30 DIAGNOSIS — L97513 Non-pressure chronic ulcer of other part of right foot with necrosis of muscle: Secondary | ICD-10-CM | POA: Diagnosis not present

## 2017-05-03 ENCOUNTER — Other Ambulatory Visit
Admission: RE | Admit: 2017-05-03 | Discharge: 2017-05-03 | Disposition: A | Discharge status: Home / Self Care | Payer: PPO | Source: Ambulatory Visit | Attending: Internal Medicine | Admitting: Internal Medicine

## 2017-05-03 DIAGNOSIS — L03115 Cellulitis of right lower limb: Secondary | ICD-10-CM | POA: Insufficient documentation

## 2017-05-03 LAB — COMPREHENSIVE METABOLIC PANEL
ALBUMIN: 3 g/dL — AB (ref 3.5–5.0)
ALT: 12 U/L — ABNORMAL LOW (ref 14–54)
AST: 21 U/L (ref 15–41)
Alkaline Phosphatase: 62 U/L (ref 38–126)
Anion gap: 10 (ref 5–15)
BUN: 13 mg/dL (ref 6–20)
CHLORIDE: 101 mmol/L (ref 101–111)
CO2: 24 mmol/L (ref 22–32)
CREATININE: 0.76 mg/dL (ref 0.44–1.00)
Calcium: 8.3 mg/dL — ABNORMAL LOW (ref 8.9–10.3)
GFR calc Af Amer: >60 mL/min (ref >60)
GFR calc non Af Amer: >60 mL/min (ref >60)
GLUCOSE: 157 mg/dL — AB (ref 65–99)
Potassium: 3.2 mmol/L — ABNORMAL LOW (ref 3.5–5.1)
SODIUM: 135 mmol/L (ref 135–145)
Total Bilirubin: 0.6 mg/dL (ref 0.3–1.2)
Total Protein: 6.2 g/dL — ABNORMAL LOW (ref 6.5–8.1)

## 2017-05-03 LAB — CBC WITH DIFFERENTIAL/PLATELET
BASOS ABS: 0.1 10*3/uL (ref 0–0.1)
BASOS PCT: 1 %
EOS ABS: 0.2 10*3/uL (ref 0–0.7)
EOS PCT: 2 %
HCT: 33.3 % — ABNORMAL LOW (ref 35.0–47.0)
Hemoglobin: 10.8 g/dL — ABNORMAL LOW (ref 12.0–16.0)
LYMPHS PCT: 8 %
Lymphs Abs: 0.9 10*3/uL — ABNORMAL LOW (ref 1.0–3.6)
MCH: 37 pg — ABNORMAL HIGH (ref 26.0–34.0)
MCHC: 32.3 g/dL (ref 32.0–36.0)
MCV: 114.6 fL — AB (ref 80.0–100.0)
Monocytes Absolute: 1.5 10*3/uL — ABNORMAL HIGH (ref 0.2–0.9)
Monocytes Relative: 14 %
Neutro Abs: 8.3 10*3/uL — ABNORMAL HIGH (ref 1.4–6.5)
Neutrophils Relative %: 75 %
PLATELETS: 235 10*3/uL (ref 150–440)
RBC: 2.91 MIL/uL — ABNORMAL LOW (ref 3.80–5.20)
RDW: 16.6 % — ABNORMAL HIGH (ref 11.5–14.5)
WBC: 11 10*3/uL (ref 3.6–11.0)

## 2017-05-03 LAB — C-REACTIVE PROTEIN: CRP: 5.7 mg/dL — ABNORMAL HIGH (ref <1.0)

## 2017-05-07 DIAGNOSIS — L02619 Cutaneous abscess of unspecified foot: Secondary | ICD-10-CM | POA: Diagnosis not present

## 2017-05-07 DIAGNOSIS — L03115 Cellulitis of right lower limb: Secondary | ICD-10-CM | POA: Diagnosis not present

## 2017-05-07 DIAGNOSIS — Z792 Long term (current) use of antibiotics: Secondary | ICD-10-CM | POA: Diagnosis not present

## 2017-05-10 ENCOUNTER — Other Ambulatory Visit
Admission: RE | Admit: 2017-05-10 | Discharge: 2017-05-10 | Disposition: A | Discharge status: Home / Self Care | Payer: PPO | Source: Ambulatory Visit | Attending: Internal Medicine | Admitting: Internal Medicine

## 2017-05-10 DIAGNOSIS — L03115 Cellulitis of right lower limb: Secondary | ICD-10-CM | POA: Insufficient documentation

## 2017-05-10 LAB — CBC WITH DIFFERENTIAL/PLATELET
BASOS PCT: 1 %
Basophils Absolute: 0.1 10*3/uL (ref 0–0.1)
Eosinophils Absolute: 0.3 10*3/uL (ref 0–0.7)
Eosinophils Relative: 2 %
HCT: 35.4 % (ref 35.0–47.0)
HEMOGLOBIN: 11.8 g/dL — AB (ref 12.0–16.0)
LYMPHS ABS: 1.1 10*3/uL (ref 1.0–3.6)
Lymphocytes Relative: 9 %
MCH: 37.5 pg — ABNORMAL HIGH (ref 26.0–34.0)
MCHC: 33.2 g/dL (ref 32.0–36.0)
MCV: 112.7 fL — ABNORMAL HIGH (ref 80.0–100.0)
MONOS PCT: 14 %
Monocytes Absolute: 1.7 10*3/uL — ABNORMAL HIGH (ref 0.2–0.9)
NEUTROS ABS: 8.8 10*3/uL — AB (ref 1.4–6.5)
Neutrophils Relative %: 74 %
Platelets: 284 10*3/uL (ref 150–440)
RBC: 3.14 MIL/uL — ABNORMAL LOW (ref 3.80–5.20)
RDW: 16.6 % — ABNORMAL HIGH (ref 11.5–14.5)
WBC: 11.9 10*3/uL — ABNORMAL HIGH (ref 3.6–11.0)

## 2017-05-10 LAB — COMPREHENSIVE METABOLIC PANEL
ALBUMIN: 3.4 g/dL — AB (ref 3.5–5.0)
ALK PHOS: 63 U/L (ref 38–126)
ALT: 17 U/L (ref 14–54)
ANION GAP: 11 (ref 5–15)
AST: 27 U/L (ref 15–41)
BUN: 14 mg/dL (ref 6–20)
CALCIUM: 8.7 mg/dL — AB (ref 8.9–10.3)
CO2: 24 mmol/L (ref 22–32)
CREATININE: 0.81 mg/dL (ref 0.44–1.00)
Chloride: 104 mmol/L (ref 101–111)
GFR calc Af Amer: >60 mL/min (ref >60)
GFR calc non Af Amer: >60 mL/min (ref >60)
GLUCOSE: 90 mg/dL (ref 65–99)
Potassium: 4.2 mmol/L (ref 3.5–5.1)
Sodium: 139 mmol/L (ref 135–145)
TOTAL PROTEIN: 6 g/dL — AB (ref 6.5–8.1)
Total Bilirubin: 0.7 mg/dL (ref 0.3–1.2)

## 2017-05-10 LAB — C-REACTIVE PROTEIN: CRP: <0.8 mg/dL (ref <1.0)

## 2017-05-14 DIAGNOSIS — L97513 Non-pressure chronic ulcer of other part of right foot with necrosis of muscle: Secondary | ICD-10-CM | POA: Diagnosis not present

## 2017-05-22 ENCOUNTER — Non-Acute Institutional Stay (SKILLED_NURSING_FACILITY): Payer: PPO | Admitting: Gerontology

## 2017-05-22 DIAGNOSIS — J189 Pneumonia, unspecified organism: Secondary | ICD-10-CM

## 2017-05-22 DIAGNOSIS — W19XXXA Unspecified fall, initial encounter: Secondary | ICD-10-CM | POA: Diagnosis not present

## 2017-05-22 DIAGNOSIS — S2242XA Multiple fractures of ribs, left side, initial encounter for closed fracture: Secondary | ICD-10-CM

## 2017-05-22 DIAGNOSIS — R079 Chest pain, unspecified: Secondary | ICD-10-CM | POA: Diagnosis not present

## 2017-05-22 DIAGNOSIS — S2249XA Multiple fractures of ribs, unspecified side, initial encounter for closed fracture: Secondary | ICD-10-CM | POA: Insufficient documentation

## 2017-05-22 DIAGNOSIS — S0003XA Contusion of scalp, initial encounter: Secondary | ICD-10-CM

## 2017-05-22 DIAGNOSIS — R0781 Pleurodynia: Secondary | ICD-10-CM | POA: Insufficient documentation

## 2017-05-22 NOTE — Progress Notes (Signed)
Location:      Place of Service:  SNF (31) Provider:  Toni Arthurs, NP-C  Sparks, [name], MD  [name], MD as PCP - General (Internal Medicine)  Extended Emergency Contact Information Primary Emergency Contact: [name],[name] Address: [place] Phone: [phone] Work Phone: [phone] Relation: Sister Secondary Emergency Contact: [place] Mobile Phone: [phone] Relation: Niece  Code Status: DNR Goals of care: Advanced Directive information Advanced Directives 04/27/2017  Does Patient Have a Medical Advance Directive? Yes  Type of Paramedic of [place];Out of facility DNR (pink MOST or yellow form)  Does patient want to make changes to medical advance directive? No - Patient declined  Copy of [place] in Chart? Yes  Would patient like information on creating a medical advance directive? -     Chief Complaint  Patient presents with  . Acute Visit    rib pain    HPI:  Pt is a 84 y.o. female seen today for an acute visit for   Pneumonia of left lung due to infectious organism, unspecified part of lung This was an incidental finding on the chest x-ray obtained today.  Patient has had a slightly elevated low-grade temp over the past day.  Patient is also having some slightly increased confusion.  No reports of cough or congestion.  Closed fracture of multiple ribs of left side, initial encounter Chest x-ray shows fracture of the eighth and ninth ribs on the left side.  Patient is having difficulty taking a deep breath.  Area is exquisitely painful.  Patient seen holding hand over the area of the fractured ribs with a facial grimace.  Contusion of scalp, initial encounter Patient has large light blue hematoma above the left temple.  There is also some faint bruising just below the left orbit.  There does not appear to be any damage to  her glasses.  When asked, patient reports that when she fell, she hit her head.  However, in the documentation of the fall from earlier today, nursing states she fell straight back on her bottom without injury.  No other recent falls documented.  Patient has dementia and is having intermittent confusion that is not a new finding.  Mild tenderness to the areas of bruising when palpated.  No crepitus felt skeletal structure felt firm and intact at bruising side.  Patient denies changes in vision, dizziness, nausea or vomiting.  Fall, initial encounter Nursing reports a fall that occurred today that was witnessed by the CNA.  Patient tried to stand up unassisted from the wheelchair but sat back down quickly when she lost her balance.  Patient did not lock the wheelchair wheels, wheelchair rolled out from under her and patient slid to the floor on her bottom.  Patient was laughing at the time and said she was not injured.  However, later, she began to complain of left rib pain.  X-rays showed left rib fractures, some left-sided pneumonia and infiltrates, and patient has a faint hematoma/bruising on the left temple and left orbit.  It appears highly likely the patient had a previous fall onto the left side that staff did not know about.  However, patient has dementia and cannot confirm this and so this is purely speculation.  Patient is forgetful and impulsive and noncompliant with precautions.   Please note pt with limited verbal/cognitive ability. Unable to obtain complete  ROS. Some ROS info obtained from staff and documentation.   Past Medical History:  Diagnosis Date  . breast cancer   . DVT (deep venous thrombosis) ([place]) 06/2005   RLE ; treated x 3 months with Coumadin  . Fibrocystic breast disease    Severe; status post bilateral mastectomies and implants  . GERD (gastroesophageal reflux disease)    Severe; manifested vocal cord irritation, status-post Nissen fundcoplication in 07/6438  . History of  ataxia    with questionable right upper and lower  . History of breast cancer   . History of exertional chest pain    with her last stress echo in 09/1999 being normal  . History of migraine    vascular  . History of shingles   . History of syncope 12/1999   With negative cardiac workup to include an echocardiogram, which revealed only mild to moderate MR, a CT scan of the chest, which was negative, and a Cardiolite stress test, which was normal  . Hyperlipidemia, unspecified   . Hypertension    CTA 05/2004 showed no renovascular cause  . Mild dementia   . Muscle atrophy    Positive FANA and anti- SCL70 antibodies; S/p biopsy without eosinic fascitis; Remicade discontinued  . Osteoarthritis    a. Cervical spine. b. Lumbar spine/spinal stenosis. [facility])  . Osteoporosis    a. Fosamax b. Right wrist fracture. c. Sternal fracture ([place])  . Rheumatoid arthritis without elevated rheumatoid factor (HCC)    a. Methotrexate. b. S/p Remicade. c. Seronegative. d. S/p Plaquenil. [facility])   Past Surgical History:  Procedure Laterality Date  . APPENDECTOMY    . CHOLECYSTECTOMY    . IRRIGATION AND DEBRIDEMENT ABSCESS Right 04/08/2017   Procedure: IRRIGATION AND DEBRIDEMENT ABSCESS with wound vac;  Surgeon: Albertine Patricia, DPM;  Location: [facility] ORS;  Service: Podiatry;  Laterality: Right;  . LAPAROSCOPIC NISSEN FUNDOPLICATION  34/7425  . LUMBAR LAMINECTOMY     for spinal stenosis  . MASTECTOMY Bilateral    with implants  . ORIF DISTAL RADIUS FRACTURE Right 07/27/2008   with volar plate  . TOTAL ABDOMINAL HYSTERECTOMY W/ BILATERAL SALPINGOOPHORECTOMY    . VASCULAR SURGERY      Allergies  Allergen Reactions  . Atorvastatin Other (See Comments)    Myositis  . Bisphosphonates Other (See Comments)    GI upset  . Codeine Other (See Comments)  . Memantine Other (See Comments)  . Nsaids Other (See Comments)    GI upset  . Omeprazole Nausea Only  . Other Other (See Comments)    Leg cramps GI  upset  . Salsalate Other (See Comments)  . Sulfa Antibiotics     Pt doesn't remember  . Tamoxifen Other (See Comments)    Leg cramps  . Penicillins Itching    Has patient had a PCN reaction causing immediate rash, facial/tongue/throat swelling, SOB or lightheadedness with hypotension: Yes Has patient had a PCN reaction causing severe rash involving mucus membranes or skin necrosis: No Has patient had a PCN reaction that required hospitalization: No Has patient had a PCN reaction occurring within the last 10 years: No If all of the above answers are "NO", then may proceed with Cephalosporin use.    Allergies as of 05/22/2017      Reactions   Atorvastatin Other (See Comments)   Myositis   Bisphosphonates Other (See Comments)   GI upset   Codeine Other (See Comments)   Memantine Other (See Comments)   Nsaids Other (See Comments)  GI upset   Omeprazole Nausea Only   Other Other (See Comments)   Leg cramps GI upset   Salsalate Other (See Comments)   Sulfa Antibiotics    Pt doesn't remember   Tamoxifen Other (See Comments)   Leg cramps   Penicillins Itching   Has patient had a PCN reaction causing immediate rash, facial/tongue/throat swelling, SOB or lightheadedness with hypotension: Yes Has patient had a PCN reaction causing severe rash involving mucus membranes or skin necrosis: No Has patient had a PCN reaction that required hospitalization: No Has patient had a PCN reaction occurring within the last 10 years: No If all of the above answers are "NO", then may proceed with Cephalosporin use.      Medication List        Accurate as of 05/22/17  4:45 PM. Always use your most recent med list.          acetaminophen 650 MG CR tablet Commonly known as:  TYLENOL Take 1,300 mg by mouth 2 (two) times daily.   amLODipine 10 MG tablet Commonly known as:  NORVASC Take 10 mg by mouth daily.   aspirin EC 81 MG tablet Take 81 mg by mouth daily.   cefTRIAXone 1 g in sodium  chloride 0.9 % 100 mL Inject 1 g into the vein daily.   docusate sodium 100 MG capsule Commonly known as:  COLACE Take 1 capsule (100 mg total) by mouth 2 (two) times daily.   folic acid 1 MG tablet Commonly known as:  FOLVITE Take 1 mg by mouth daily.   heparin flush 10 UNIT/ML Soln injection Inject 5 mLs into the vein as needed. Flush PICC line using SASH (Saline, Antibiotic, Saline, Heparin) method with each intermittent administration of medication. If not being used, flush every 24 hours with saline followed by heparin flush.   isosorbide mononitrate 30 MG 24 hr tablet Commonly known as:  IMDUR Take 30 mg by mouth daily.   losartan 100 MG tablet Commonly known as:  COZAAR Take 100 mg by mouth daily.   methotrexate 2.5 MG tablet Commonly known as:  RHEUMATREX Take 6 tablets by mouth every Friday.   metoprolol tartrate 100 MG tablet Commonly known as:  LOPRESSOR Take 100 mg by mouth 2 (two) times daily.   mirtazapine 7.5 MG tablet Commonly known as:  REMERON Take 7.5 mg by mouth at bedtime.   MULTI-VITAMINS Tabs Take 1 tablet by mouth daily.   QUEtiapine 25 MG tablet Commonly known as:  SEROQUEL Take 25 mg by mouth daily.   sertraline 100 MG tablet Commonly known as:  ZOLOFT Take 100 mg by mouth daily.   traMADol 50 MG tablet Commonly known as:  ULTRAM Take 50-100 mg by mouth every 4 (four) hours as needed for moderate pain or severe pain. 1 tab for moderate pain 2 tabs for severe pain   vitamin C 1000 MG tablet Take 1,000 mg by mouth daily.       Review of Systems  Unable to perform ROS: Dementia  Constitutional: Negative for activity change, appetite change, chills, diaphoresis and fever.  HENT: Negative for congestion, mouth sores, nosebleeds, postnasal drip, sneezing, sore throat, trouble swallowing and voice change.   Respiratory: Positive for shortness of breath (unable to take a deep breath d/t pain). Negative for apnea, cough, choking, chest  tightness and wheezing.   Cardiovascular: Negative for chest pain, palpitations and leg swelling.  Gastrointestinal: Negative for abdominal distention, abdominal pain, constipation, diarrhea and nausea.  Genitourinary:  Negative for difficulty urinating, dysuria, frequency and urgency.  Musculoskeletal: Positive for arthralgias (typical arthritis), gait problem and myalgias. Negative for back pain.  Skin: Negative for color change, pallor, rash and wound.  Neurological: Positive for weakness. Negative for dizziness, tremors, syncope, speech difficulty, numbness and headaches.  Psychiatric/Behavioral: Positive for agitation, behavioral problems and confusion.  All other systems reviewed and are negative.   Immunization History  Administered Date(s) Administered  . Influenza-Unspecified 11/27/2013  . Pneumococcal Polysaccharide-23 03/27/2013   Pertinent  Health Maintenance Due  Topic Date Due  . DEXA SCAN  04/28/1997  . PNA vac Low Risk Adult (2 of 2 - PCV13) 03/27/2014  . INFLUENZA VACCINE  09/27/2016   No flowsheet data found. Functional Status Survey:    Vitals:   05/22/17 1200  BP: (!) 185/71  Pulse: 68  Resp: 16  Temp: 98.3 F (36.8 C)  SpO2: 97%   There is no height or weight on file to calculate BMI. Physical Exam  Constitutional: Vital signs are normal. She appears well-developed and well-nourished. She is active and cooperative. She does not appear ill. She appears distressed (pain).  HENT:  Head: Normocephalic. Head is with contusion.    Mouth/Throat: Uvula is midline, oropharynx is clear and moist and mucous membranes are normal. Mucous membranes are not pale, not dry and not cyanotic.  Eyes: Pupils are equal, round, and reactive to light. Conjunctivae, EOM and lids are normal.  Neck: Trachea normal, normal range of motion and full passive range of motion without pain. Neck supple. No JVD present. No tracheal deviation, no edema and no erythema present. No  thyromegaly present.  Cardiovascular: Normal rate, regular rhythm, normal heart sounds, intact distal pulses and normal pulses. Exam reveals no gallop, no distant heart sounds and no friction rub.  No murmur heard. Pulses:      Dorsalis pedis pulses are 2+ on the right side, and 2+ on the left side.  No edema  Pulmonary/Chest: Effort normal. No accessory muscle usage. No respiratory distress. She has decreased breath sounds in the right lower field, the left upper field, the left middle field and the left lower field. She has no wheezes. She has no rhonchi. She has no rales. She exhibits no tenderness.  Abdominal: Soft. Normal appearance and bowel sounds are normal. She exhibits no distension and no ascites. There is no tenderness.  Musculoskeletal: Normal range of motion. She exhibits no edema or tenderness.  Expected osteoarthritis, stiffness; Bilateral Calves soft, supple. Negative Homan's Sign. B- pedal pulses equal; generalized weakness, mobile with wheelchair; left 8 and 9 rib Fractures  Neurological: She is alert. She has normal strength. She displays atrophy. A cranial nerve deficit and sensory deficit is present. She exhibits abnormal muscle tone. Coordination and gait abnormal.  Skin: Skin is warm, dry and intact. She is not diaphoretic. No cyanosis. No pallor. Nails show no clubbing.     Psychiatric: Her speech is normal. Her mood appears anxious. She is agitated. Thought content is delusional. Cognition and memory are impaired. She expresses impulsivity and inappropriate judgment. She exhibits a depressed mood. She exhibits abnormal recent memory and abnormal remote memory.  Nursing note and vitals reviewed.   Labs reviewed: Recent Labs    04/26/17 0610 05/03/17 0930 05/10/17 1200  NA 142 135 139  K 3.0* 3.2* 4.2  CL 106 101 104  CO2 27 24 24   GLUCOSE 85 157* 90  BUN 16 13 14   CREATININE 0.75 0.76 0.81  CALCIUM 8.8* 8.3* 8.7*  Recent Labs    04/26/17 0610  05/03/17 0930 05/10/17 1200  AST 25 21 27   ALT 17 12* 17  ALKPHOS 59 62 63  BILITOT 0.5 0.6 0.7  PROT 6.4* 6.2* 6.0*  ALBUMIN 3.3* 3.0* 3.4*   Recent Labs    04/26/17 0610 05/03/17 0930 05/10/17 1200  WBC 8.5 11.0 11.9*  NEUTROABS 5.4 8.3* 8.8*  HGB 11.1* 10.8* 11.8*  HCT 33.4* 33.3* 35.4  MCV 114.0* 114.6* 112.7*  PLT 273 235 284   Lab Results  Component Value Date   TSH 1.412 06/23/2016   No results found for: HGBA1C No results found for: CHOL, HDL, LDLCALC, LDLDIRECT, TRIG, CHOLHDL  Significant Diagnostic Results in last 30 days:  No results found.  Assessment/Plan Dawnya was seen today for acute visit.  Diagnoses and all orders for this visit:  Pneumonia of left lung due to infectious organism, unspecified part of lung  Closed fracture of multiple ribs of left side, initial encounter  Contusion of scalp, initial encounter  Fall, initial encounter    1 view chest x-ray with concentration on the left ribs  Aspercreme lidocaine 4% patch-1 patch to the left ribs daily, remove patch every 12 hours  DC current PRN tramadol order  Tramadol 50 mg p.o. 3 times daily scheduled  Tramadol 50 mg 1 tablet p.o. every 4 hours as needed breakthrough pain  Ice pack to the left side of the head/face 3 times daily times 5 days for bruising  Azithromycin 250 mg tablets-2 tablets today, then 1 tablet daily times 4 days for pneumonia  Duo nebs 3 times daily scheduled times 3 days for pneumonia  Continue duo nebs every 6 hours as needed breakthrough shortness of breath or wheezing  Safety precautions  Fall risk  Monitor for altered mental status  Educate patient on splinting ribs to cough  Family/ staff Communication:   Total Time:  Documentation:  Face to Face:  Family/Phone:   Labs/tests ordered: 1 view chest x-ray  Medication list reviewed and assessed for continued appropriateness.  Vikki Ports, NP-C Geriatrics [facility] [phone], [place] [address] Cell Phone (Mon-Fri 8am-5pm):  [phone] On Call:  [phone] & follow prompts after 5pm & weekends Office Phone:  [phone] Office Fax:  [phone]

## 2017-05-23 ENCOUNTER — Other Ambulatory Visit: Payer: Self-pay

## 2017-05-23 MED ORDER — TRAMADOL HCL 50 MG PO TABS: 50.0000 mg | ORAL_TABLET | Freq: Three times a day (TID) | ORAL | 1 refills | Status: DC

## 2017-05-23 MED ORDER — TRAMADOL HCL 50 MG PO TABS: 50.0000 mg | ORAL_TABLET | ORAL | 1 refills | Status: DC | PRN

## 2017-05-23 NOTE — Telephone Encounter (Signed)
Rx sent to [facility] phone : [phone] , fax : [phone]  

## 2017-05-25 DIAGNOSIS — R079 Chest pain, unspecified: Secondary | ICD-10-CM | POA: Diagnosis not present

## 2017-05-28 ENCOUNTER — Encounter
Admission: RE | Admit: 2017-05-28 | Discharge: 2017-05-28 | Disposition: A | Discharge status: Home / Self Care | Payer: PPO | Source: Ambulatory Visit | Attending: Internal Medicine | Admitting: Internal Medicine

## 2017-05-28 DIAGNOSIS — I10 Essential (primary) hypertension: Secondary | ICD-10-CM | POA: Insufficient documentation

## 2017-06-04 ENCOUNTER — Other Ambulatory Visit: Payer: Self-pay

## 2017-06-04 ENCOUNTER — Encounter: Payer: Self-pay | Admitting: Gerontology

## 2017-06-04 ENCOUNTER — Non-Acute Institutional Stay (SKILLED_NURSING_FACILITY): Payer: PPO | Admitting: Gerontology

## 2017-06-04 DIAGNOSIS — I272 Pulmonary hypertension, unspecified: Secondary | ICD-10-CM | POA: Diagnosis not present

## 2017-06-04 DIAGNOSIS — K219 Gastro-esophageal reflux disease without esophagitis: Secondary | ICD-10-CM

## 2017-06-04 DIAGNOSIS — Z9119 Patient's noncompliance with other medical treatment and regimen: Secondary | ICD-10-CM | POA: Diagnosis not present

## 2017-06-04 DIAGNOSIS — I10 Essential (primary) hypertension: Secondary | ICD-10-CM | POA: Diagnosis not present

## 2017-06-04 MED ORDER — TRAMADOL HCL 50 MG PO TABS: 50.0000 mg | ORAL_TABLET | Freq: Three times a day (TID) | ORAL | 1 refills | Status: DC

## 2017-06-04 MED ORDER — TRAMADOL HCL 50 MG PO TABS: 50.0000 mg | ORAL_TABLET | ORAL | 1 refills | Status: DC | PRN

## 2017-06-04 NOTE — Progress Notes (Signed)
Location:    [facility]ursing Home Room Number: 950D Place of Service:  SNF (31) Provider:  Toni Arthurs, NP-C  Sparks, [name], MD  [name], MD as PCP - General (Internal Medicine)  Extended Emergency Contact Information Primary Emergency Contact: [name],[name] Address: [address] [place] Phone: [phone] Work Phone: [phone] Relation: Sister Secondary Emergency Contact: [place] Phone: [phone] Relation: Niece  Code Status:  DNR Goals of care: Advanced Directive information Advanced Directives 06/04/2017  Does Patient Have a Medical Advance Directive? Yes  Type of Paramedic of [place];Out of facility DNR (pink MOST or yellow form)  Does patient want to make changes to medical advance directive? No - Patient declined  Copy of [place] in Chart? Yes  Would patient like information on creating a medical advance directive? -  Pre-existing out of facility DNR order (yellow form or pink MOST form) Yellow form placed in chart (order not valid for inpatient use)     Chief Complaint  Patient presents with  . Medical Management of Chronic Issues    Routine Visit    HPI:  Pt is a 84 y.o. female seen today for medical management of chronic diseases.    Pulmonary HTN (HCC) Stable. No complaints of chest pain or shortness of breath. O2 sats stable without use of oxygen.   HTN (hypertension) Stable. No recent episodes of hyper or hypotension. Denies chest pain or shortness of breath. Symptoms managed with Lopressor 100 mg po BID, Cozaar 100 mg Q Day and Norvasc 10 mg po Q Day  GERD (gastroesophageal reflux disease) Stable. No recent complaints of worsening symptoms. Symptoms controlled without the use of daily medications  Please note pt with limited verbal/cognitive ability. Unable to obtain complete ROS. Some ROS info obtained from staff and  documentation.   Past Medical History:  Diagnosis Date  . breast cancer   . DVT (deep venous thrombosis) ([place]) 06/2005   RLE ; treated x 3 months with Coumadin  . Fibrocystic breast disease    Severe; status post bilateral mastectomies and implants  . GERD (gastroesophageal reflux disease)    Severe; manifested vocal cord irritation, status-post Nissen fundcoplication in 08/9022  . History of ataxia    with questionable right upper and lower  . History of breast cancer   . History of exertional chest pain    with her last stress echo in 09/1999 being normal  . History of migraine    vascular  . History of shingles   . History of syncope 12/1999   With negative cardiac workup to include an echocardiogram, which revealed only mild to moderate MR, a CT scan of the chest, which was negative, and a Cardiolite stress test, which was normal  . Hyperlipidemia, unspecified   . Hypertension    CTA 05/2004 showed no renovascular cause  . Mild dementia   . Muscle atrophy    Positive FANA and anti- SCL70 antibodies; S/p biopsy without eosinic fascitis; Remicade discontinued  . Osteoarthritis    a. Cervical spine. b. Lumbar spine/spinal stenosis. [facility])  . Osteoporosis    a. Fosamax b. Right wrist fracture. c. Sternal fracture ([place])  . Rheumatoid arthritis without elevated rheumatoid factor (HCC)    a. Methotrexate. b. S/p Remicade. c. Seronegative. d. S/p Plaquenil. [facility])   Past Surgical History:  Procedure Laterality Date  . APPENDECTOMY    .  CHOLECYSTECTOMY    . IRRIGATION AND DEBRIDEMENT ABSCESS Right 04/08/2017   Procedure: IRRIGATION AND DEBRIDEMENT ABSCESS with wound vac;  Surgeon: Albertine Patricia, DPM;  Location: [facility] ORS;  Service: Podiatry;  Laterality: Right;  . LAPAROSCOPIC NISSEN FUNDOPLICATION  72/0947  . LUMBAR LAMINECTOMY     for spinal stenosis  . MASTECTOMY Bilateral    with implants  . ORIF DISTAL RADIUS FRACTURE Right 07/27/2008   with volar plate  . TOTAL ABDOMINAL  HYSTERECTOMY W/ BILATERAL SALPINGOOPHORECTOMY    . VASCULAR SURGERY      Allergies  Allergen Reactions  . Atorvastatin Other (See Comments)    Myositis  . Bisphosphonates Other (See Comments)    GI upset  . Codeine Other (See Comments)  . Memantine Other (See Comments)  . Nsaids Other (See Comments)    GI upset  . Omeprazole Nausea Only  . Other Other (See Comments)    Leg cramps GI upset  . Salsalate Other (See Comments)  . Sulfa Antibiotics     Pt doesn't remember  . Tamoxifen Other (See Comments)    Leg cramps  . Penicillins Itching    Has patient had a PCN reaction causing immediate rash, facial/tongue/throat swelling, SOB or lightheadedness with hypotension: Yes Has patient had a PCN reaction causing severe rash involving mucus membranes or skin necrosis: No Has patient had a PCN reaction that required hospitalization: No Has patient had a PCN reaction occurring within the last 10 years: No If all of the above answers are "NO", then may proceed with Cephalosporin use.    Allergies as of 06/04/2017      Reactions   Atorvastatin Other (See Comments)   Myositis   Bisphosphonates Other (See Comments)   GI upset   Codeine Other (See Comments)   Memantine Other (See Comments)   Nsaids Other (See Comments)   GI upset   Omeprazole Nausea Only   Other Other (See Comments)   Leg cramps GI upset   Salsalate Other (See Comments)   Sulfa Antibiotics    Pt doesn't remember   Tamoxifen Other (See Comments)   Leg cramps   Penicillins Itching   Has patient had a PCN reaction causing immediate rash, facial/tongue/throat swelling, SOB or lightheadedness with hypotension: Yes Has patient had a PCN reaction causing severe rash involving mucus membranes or skin necrosis: No Has patient had a PCN reaction that required hospitalization: No Has patient had a PCN reaction occurring within the last 10 years: No If all of the above answers are "NO", then may proceed with Cephalosporin  use.      Medication List        Accurate as of 06/04/17 10:05 PM. Always use your most recent med list.          acetaminophen 650 MG CR tablet Commonly known as:  TYLENOL Take 1,300 mg by mouth 2 (two) times daily.   amLODipine 10 MG tablet Commonly known as:  NORVASC Take 10 mg by mouth daily.   aspirin EC 81 MG tablet Take 81 mg by mouth daily.   clindamycin 300 MG capsule Commonly known as:  CLEOCIN Take 600 mg by mouth every 8 (eight) hours.   docusate sodium 100 MG capsule Commonly known as:  COLACE Take 1 capsule (100 mg total) by mouth 2 (two) times daily.   folic acid 1 MG tablet Commonly known as:  FOLVITE Take 1 mg by mouth daily.   ipratropium-albuterol 0.5-2.5 (3) MG/3ML Soln Commonly known as:  DUONEB  Take 3 mLs by nebulization every 6 (six) hours as needed.   isosorbide mononitrate 30 MG 24 hr tablet Commonly known as:  IMDUR Take 30 mg by mouth daily.   Lidocaine 4 % Ptch Apply 1 patch topically daily. Left Ribs- Remove patch after 12 hours   losartan 100 MG tablet Commonly known as:  COZAAR Take 100 mg by mouth daily.   methotrexate 2.5 MG tablet Commonly known as:  RHEUMATREX Take 6 tablets by mouth every Friday.   metoprolol tartrate 100 MG tablet Commonly known as:  LOPRESSOR Take 100 mg by mouth 2 (two) times daily.   mirtazapine 7.5 MG tablet Commonly known as:  REMERON Take 7.5 mg by mouth at bedtime.   MULTI-VITAMINS Tabs Take 1 tablet by mouth daily.   QUEtiapine 25 MG tablet Commonly known as:  SEROQUEL Take 25 mg by mouth daily.   sertraline 100 MG tablet Commonly known as:  ZOLOFT Take 100 mg by mouth daily.   traMADol 50 MG tablet Commonly known as:  ULTRAM Take 1 tablet (50 mg total) by mouth every 4 (four) hours as needed.   traMADol 50 MG tablet Commonly known as:  ULTRAM Take 1 tablet (50 mg total) by mouth 3 (three) times daily. for pain- ok to hold for sedation   vitamin C 1000 MG tablet Take 1,000 mg  by mouth daily.       Review of Systems  Unable to perform ROS: Dementia  Constitutional: Negative for activity change, appetite change, chills, diaphoresis and fever.  HENT: Negative for congestion, mouth sores, nosebleeds, postnasal drip, sneezing, sore throat, trouble swallowing and voice change.   Respiratory: Negative for apnea, cough, choking, chest tightness, shortness of breath and wheezing.   Cardiovascular: Negative for chest pain, palpitations and leg swelling.  Gastrointestinal: Negative for abdominal distention, abdominal pain, constipation, diarrhea and nausea.  Genitourinary: Negative for difficulty urinating, dysuria, frequency and urgency.  Musculoskeletal: Positive for arthralgias (typical arthritis) and gait problem. Negative for back pain and myalgias.  Skin: Positive for wound. Negative for color change, pallor and rash.  Neurological: Negative for dizziness, tremors, syncope, speech difficulty, weakness, numbness and headaches.  Psychiatric/Behavioral: Positive for agitation (at times) and confusion. Negative for behavioral problems.  All other systems reviewed and are negative.   Immunization History  Administered Date(s) Administered  . Influenza-Unspecified 11/27/2013  . Pneumococcal Polysaccharide-23 03/27/2013   Pertinent  Health Maintenance Due  Topic Date Due  . DEXA SCAN  04/28/1997  . PNA vac Low Risk Adult (2 of 2 - PCV13) 03/27/2014  . INFLUENZA VACCINE  09/27/2017   No flowsheet data found. Functional Status Survey:    Vitals:   06/04/17 1436  BP: (!) 137/52  Pulse: 68  Resp: 16  Temp: 98.3 F (36.8 C)  TempSrc: Oral  SpO2: 95%  Weight: 137 lb (62.1 kg)  Height: '5\' 3"'$  (1.6 m)   Body mass index is 24.27 kg/m. Physical Exam  Constitutional: Vital signs are normal. She appears well-developed and well-nourished. She is active and cooperative. She does not appear ill. No distress.  HENT:  Head: Normocephalic and atraumatic.    Mouth/Throat: Uvula is midline, oropharynx is clear and moist and mucous membranes are normal. Mucous membranes are not pale, not dry and not cyanotic.  Eyes: Pupils are equal, round, and reactive to light. Conjunctivae, EOM and lids are normal.  Neck: Trachea normal, normal range of motion and full passive range of motion without pain. Neck supple. No JVD present. No  tracheal deviation, no edema and no erythema present. No thyromegaly present.  Cardiovascular: Normal rate, regular rhythm, normal heart sounds, intact distal pulses and normal pulses. Exam reveals no gallop, no distant heart sounds and no friction rub.  No murmur heard. Pulses:      Dorsalis pedis pulses are Detected w/ doppler on the right side, and 2+ on the left side.  No edema  Pulmonary/Chest: Effort normal and breath sounds normal. No accessory muscle usage. No respiratory distress. She has no decreased breath sounds. She has no wheezes. She has no rhonchi. She has no rales. She exhibits no tenderness.  Abdominal: Soft. Normal appearance and bowel sounds are normal. She exhibits no distension and no ascites. There is no tenderness.  Musculoskeletal: She exhibits no edema.       Right foot: There is decreased range of motion and tenderness.  Expected osteoarthritis, stiffness; Bilateral Calves soft, supple. Negative Homan's Sign. B- pedal pulses equal; generalized weakness  Feet:  Right Foot:  Skin Integrity: Positive for ulcer and skin breakdown.  Neurological: She is alert. She has normal strength. Coordination and gait abnormal.  Skin: Skin is warm, dry and intact. She is not diaphoretic. No cyanosis. No pallor. Nails show no clubbing.  Right foot wound with tendon exposure. Wound vac in place  Psychiatric: Her speech is normal. Thought content normal. Her affect is blunt. She is slowed. Cognition and memory are impaired. She expresses impulsivity. She exhibits abnormal recent memory.  Nursing note and vitals  reviewed.   Labs reviewed: Recent Labs    04/26/17 0610 05/03/17 0930 05/10/17 1200  NA 142 135 139  K 3.0* 3.2* 4.2  CL 106 101 104  CO2 '27 24 24  '$ GLUCOSE 85 157* 90  BUN '16 13 14  '$ CREATININE 0.75 0.76 0.81  CALCIUM 8.8* 8.3* 8.7*   Recent Labs    04/26/17 0610 05/03/17 0930 05/10/17 1200  AST '25 21 27  '$ ALT 17 12* 17  ALKPHOS 59 62 63  BILITOT 0.5 0.6 0.7  PROT 6.4* 6.2* 6.0*  ALBUMIN 3.3* 3.0* 3.4*   Recent Labs    04/26/17 0610 05/03/17 0930 05/10/17 1200  WBC 8.5 11.0 11.9*  NEUTROABS 5.4 8.3* 8.8*  HGB 11.1* 10.8* 11.8*  HCT 33.4* 33.3* 35.4  MCV 114.0* 114.6* 112.7*  PLT 273 235 284   Lab Results  Component Value Date   TSH 1.412 06/23/2016   No results found for: HGBA1C No results found for: CHOL, HDL, LDLCALC, LDLDIRECT, TRIG, CHOLHDL  Significant Diagnostic Results in last 30 days:  No results found.  Assessment/Plan [name] [name] was seen today for medical management of chronic issues.  Diagnoses and all orders for this visit:  Pulmonary HTN ([place])  Essential hypertension  Gastroesophageal reflux disease without esophagitis   above listed conditions stable  Continue current medication regimen  Continue to follow up with Podiatry as instructed  Monitor for worsening HTN  Monitor for worsening GERD  Encourage pt to sit upright at least 30 minutes after each meal  Labs in the AM  Family/ staff Communication:   Total Time:  Documentation:  Face to Face:  Family/Phone:   Labs/tests ordered:  Cbc, met c, Mag+, tsh, B12, D  Medication list reviewed and assessed for continued appropriateness. Monthly medication orders reviewed and signed.  Vikki Ports, NP-C Geriatrics [facility] [phone],  [address] Cell Phone (Mon-Fri 8am-5pm):  [phone] On Call:  [phone] & follow prompts after  5pm & weekends Office Phone:  [phone] Office Fax:  [phone]

## 2017-06-04 NOTE — Assessment & Plan Note (Signed)
Stable. No recent episodes of hyper or hypotension. Denies chest pain or shortness of breath. Symptoms managed with Lopressor 100 mg po BID, Cozaar 100 mg Q Day and Norvasc 10 mg po Q Day

## 2017-06-04 NOTE — Telephone Encounter (Signed)
Rx sent to [facility] phone : [phone] , fax : [phone]  

## 2017-06-04 NOTE — Assessment & Plan Note (Signed)
Stable. No recent complaints of worsening symptoms. Symptoms controlled without the use of daily medications

## 2017-06-04 NOTE — Assessment & Plan Note (Signed)
Stable. No complaints of chest pain or shortness of breath. O2 sats stable without use of oxygen.

## 2017-06-05 ENCOUNTER — Other Ambulatory Visit
Admission: RE | Admit: 2017-06-05 | Discharge: 2017-06-05 | Disposition: A | Discharge status: Home / Self Care | Payer: PPO | Source: Ambulatory Visit | Attending: Gerontology | Admitting: Gerontology

## 2017-06-05 DIAGNOSIS — I10 Essential (primary) hypertension: Secondary | ICD-10-CM | POA: Insufficient documentation

## 2017-06-05 LAB — CBC WITH DIFFERENTIAL/PLATELET
BASOS PCT: 1 %
Basophils Absolute: 0.1 10*3/uL (ref 0–0.1)
EOS ABS: 0.5 10*3/uL (ref 0–0.7)
Eosinophils Relative: 6 %
HEMATOCRIT: 34.8 % — AB (ref 35.0–47.0)
HEMOGLOBIN: 11.4 g/dL — AB (ref 12.0–16.0)
LYMPHS ABS: 1.8 10*3/uL (ref 1.0–3.6)
Lymphocytes Relative: 21 %
MCH: 36.8 pg — AB (ref 26.0–34.0)
MCHC: 32.7 g/dL (ref 32.0–36.0)
MCV: 112.8 fL — ABNORMAL HIGH (ref 80.0–100.0)
MONOS PCT: 15 %
Monocytes Absolute: 1.3 10*3/uL — ABNORMAL HIGH (ref 0.2–0.9)
NEUTROS PCT: 57 %
Neutro Abs: 4.8 10*3/uL (ref 1.4–6.5)
Platelets: 241 10*3/uL (ref 150–440)
RBC: 3.09 MIL/uL — AB (ref 3.80–5.20)
RDW: 17.7 % — ABNORMAL HIGH (ref 11.5–14.5)
WBC: 8.4 10*3/uL (ref 3.6–11.0)

## 2017-06-05 LAB — COMPREHENSIVE METABOLIC PANEL
ALK PHOS: 72 U/L (ref 38–126)
ALT: 16 U/L (ref 14–54)
AST: 23 U/L (ref 15–41)
Albumin: 3.4 g/dL — ABNORMAL LOW (ref 3.5–5.0)
Anion gap: 7 (ref 5–15)
BUN: 26 mg/dL — ABNORMAL HIGH (ref 6–20)
CALCIUM: 8.6 mg/dL — AB (ref 8.9–10.3)
CHLORIDE: 105 mmol/L (ref 101–111)
CO2: 26 mmol/L (ref 22–32)
CREATININE: 0.98 mg/dL (ref 0.44–1.00)
GFR calc non Af Amer: 51 mL/min — ABNORMAL LOW (ref >60)
GFR, EST AFRICAN AMERICAN: 59 mL/min — AB (ref >60)
Glucose, Bld: 86 mg/dL (ref 65–99)
Potassium: 3.4 mmol/L — ABNORMAL LOW (ref 3.5–5.1)
SODIUM: 138 mmol/L (ref 135–145)
Total Bilirubin: 0.6 mg/dL (ref 0.3–1.2)
Total Protein: 6.6 g/dL (ref 6.5–8.1)

## 2017-06-05 LAB — VITAMIN B12: Vitamin B-12: 321 pg/mL (ref 180–914)

## 2017-06-05 LAB — MAGNESIUM: MAGNESIUM: 2 mg/dL (ref 1.7–2.4)

## 2017-06-05 LAB — TSH: TSH: 1.753 u[IU]/mL (ref 0.350–4.500)

## 2017-06-06 LAB — VITAMIN D 25 HYDROXY (VIT D DEFICIENCY, FRACTURES): VIT D 25 HYDROXY: 27 ng/mL — AB (ref 30.0–100.0)

## 2017-06-18 DIAGNOSIS — L97513 Non-pressure chronic ulcer of other part of right foot with necrosis of muscle: Secondary | ICD-10-CM | POA: Diagnosis not present

## 2017-06-27 ENCOUNTER — Encounter
Admission: RE | Admit: 2017-06-27 | Discharge: 2017-06-27 | Disposition: A | Discharge status: Home / Self Care | Payer: PPO | Source: Ambulatory Visit | Attending: Internal Medicine | Admitting: Internal Medicine

## 2017-07-05 ENCOUNTER — Non-Acute Institutional Stay (SKILLED_NURSING_FACILITY): Payer: PPO | Admitting: Gerontology

## 2017-07-05 ENCOUNTER — Encounter: Payer: Self-pay | Admitting: Gerontology

## 2017-07-05 DIAGNOSIS — M810 Age-related osteoporosis without current pathological fracture: Secondary | ICD-10-CM

## 2017-07-05 DIAGNOSIS — M5416 Radiculopathy, lumbar region: Secondary | ICD-10-CM | POA: Diagnosis not present

## 2017-07-05 DIAGNOSIS — M0600 Rheumatoid arthritis without rheumatoid factor, unspecified site: Secondary | ICD-10-CM | POA: Diagnosis not present

## 2017-07-09 DIAGNOSIS — G629 Polyneuropathy, unspecified: Secondary | ICD-10-CM | POA: Diagnosis not present

## 2017-07-09 DIAGNOSIS — M0600 Rheumatoid arthritis without rheumatoid factor, unspecified site: Secondary | ICD-10-CM | POA: Diagnosis not present

## 2017-07-09 DIAGNOSIS — Z79899 Other long term (current) drug therapy: Secondary | ICD-10-CM | POA: Diagnosis not present

## 2017-07-09 NOTE — Progress Notes (Signed)
Location:    [facility] Home Room Number: 322B Place of Service:  SNF (31) Provider:  Toni Arthurs, NP-C  Sparks, [name] Douglas, MD  [name], MD as PCP - General (Internal Medicine)  Extended Emergency Contact Information Primary Emergency Contact: [name],[name] Address: [address] [place] Phone: [phone] Work Phone: [phone] Relation: Sister Secondary Emergency Contact: [place] Mobile Phone: [phone] Relation: Niece  Code Status:  DNR Goals of care: Advanced Directive information Advanced Directives 07/05/2017  Does Patient Have a Medical Advance Directive? Yes  Type of Paramedic of [place];Out of facility DNR (pink MOST or yellow form)  Does patient want to make changes to medical advance directive? No - Patient declined  Copy of [place] in Chart? Yes  Would patient like information on creating a medical advance directive? -  Pre-existing out of facility DNR order (yellow form or pink MOST form) Yellow form placed in chart (order not valid for inpatient use)     Chief Complaint  Patient presents with  . Medical Management of Chronic Issues    Routine Visit    HPI:  Pt is a 84 y.o. female seen today for medical management of chronic diseases.    Lumbar radiculitis Stable. No recent c/o worsening pain. On Tramadol 50 mg po TID scheduled and Q 4 hours prn, and Tylenol 1300 mg ER BID.   Rheumatoid arthritis without elevated rheumatoid factor (HCC) Stable. On Methotrexate 2.5 mg x 6 tablets po Q Friday with 1 mg Folic acid daily along with Tylenol 1300 mg ER BID and Tramadol 50 mg TID and Q 4 hours prn. No recent c/o exacerbations  Osteoporosis Recent ankle fracture several months ago. On Cholecalciferol 2,000 untis po Q Day.   Please note pt with limited verbal/cognitive ability. Unable to obtain complete ROS. Some ROS info obtained from  staff and documentation.   Past Medical History:  Diagnosis Date  . breast cancer   . DVT (deep venous thrombosis) ([place]) 06/2005   RLE ; treated x 3 months with Coumadin  . Fibrocystic breast disease    Severe; status post bilateral mastectomies and implants  . GERD (gastroesophageal reflux disease)    Severe; manifested vocal cord irritation, status-post Nissen fundcoplication in 03/4399  . History of ataxia    with questionable right upper and lower  . History of breast cancer   . History of exertional chest pain    with her last stress echo in 09/1999 being normal  . History of migraine    vascular  . History of shingles   . History of syncope 12/1999   With negative cardiac workup to include an echocardiogram, which revealed only mild to moderate MR, a CT scan of the chest, which was negative, and a Cardiolite stress test, which was normal  . Hyperlipidemia, unspecified   . Hypertension    CTA 05/2004 showed no renovascular cause  . Mild dementia   . Muscle atrophy    Positive FANA and anti- SCL70 antibodies; S/p biopsy without eosinic fascitis; Remicade discontinued  . Osteoarthritis    a. Cervical spine. b. Lumbar spine/spinal stenosis. [facility])  . Osteoporosis    a. Fosamax b. Right wrist fracture. c. Sternal fracture ([place])  . Rheumatoid arthritis without elevated rheumatoid factor (HCC)    a. Methotrexate. b. S/p Remicade. c. Seronegative. d. S/p Plaquenil. [facility]   Past Surgical History:  Procedure Laterality Date  . APPENDECTOMY    . CHOLECYSTECTOMY    . IRRIGATION AND DEBRIDEMENT ABSCESS Right 04/08/2017   Procedure: IRRIGATION AND DEBRIDEMENT ABSCESS with wound vac;  Surgeon: Albertine Patricia, DPM;  Location: [facility] ORS;  Service: Podiatry;  Laterality: Right;  . LAPAROSCOPIC NISSEN FUNDOPLICATION  51/7616  . LUMBAR LAMINECTOMY     for spinal stenosis  . MASTECTOMY Bilateral    with implants  . ORIF DISTAL RADIUS FRACTURE Right 07/27/2008   with volar plate  . TOTAL  ABDOMINAL HYSTERECTOMY W/ BILATERAL SALPINGOOPHORECTOMY    . VASCULAR SURGERY      Allergies  Allergen Reactions  . Atorvastatin Other (See Comments)    Myositis  . Bisphosphonates Other (See Comments)    GI upset  . Codeine Other (See Comments)  . Memantine Other (See Comments)  . Nsaids Other (See Comments)    GI upset  . Omeprazole Nausea Only  . Other Other (See Comments)    Leg cramps GI upset  . Salsalate Other (See Comments)  . Sulfa Antibiotics     Pt doesn't remember  . Tamoxifen Other (See Comments)    Leg cramps  . Penicillins Itching    Has patient had a PCN reaction causing immediate rash, facial/tongue/throat swelling, SOB or lightheadedness with hypotension: Yes Has patient had a PCN reaction causing severe rash involving mucus membranes or skin necrosis: No Has patient had a PCN reaction that required hospitalization: No Has patient had a PCN reaction occurring within the last 10 years: No If all of the above answers are "NO", then may proceed with Cephalosporin use.    Allergies as of 07/05/2017      Reactions   Atorvastatin Other (See Comments)   Myositis   Bisphosphonates Other (See Comments)   GI upset   Codeine Other (See Comments)   Memantine Other (See Comments)   Nsaids Other (See Comments)   GI upset   Omeprazole Nausea Only   Other Other (See Comments)   Leg cramps GI upset   Salsalate Other (See Comments)   Sulfa Antibiotics    Pt doesn't remember   Tamoxifen Other (See Comments)   Leg cramps   Penicillins Itching   Has patient had a PCN reaction causing immediate rash, facial/tongue/throat swelling, SOB or lightheadedness with hypotension: Yes Has patient had a PCN reaction causing severe rash involving mucus membranes or skin necrosis: No Has patient had a PCN reaction that required hospitalization: No Has patient had a PCN reaction occurring within the last 10 years: No If all of the above answers are "NO", then may proceed with  Cephalosporin use.      Medication List        Accurate as of 07/05/17 11:59 PM. Always use your most recent med list.          acetaminophen 650 MG CR tablet Commonly known as:  TYLENOL Take 1,300 mg by mouth 2 (two) times daily.   amLODipine 10 MG tablet Commonly known as:  NORVASC Take 10 mg by mouth daily.   aspirin EC 81 MG tablet Take 81 mg by mouth daily.   cyanocobalamin 1000 MCG tablet Take 1,000 mcg by mouth daily.   docusate sodium 100 MG capsule Commonly known as:  COLACE Take 1 capsule (100 mg total) by mouth 2 (two) times daily.   folic acid 1 MG tablet Commonly known as:  FOLVITE Take 1 mg by mouth daily.   ipratropium-albuterol 0.5-2.5 (3) MG/3ML Soln Commonly known as:  DUONEB Take 3 mLs by nebulization every 6 (six) hours as needed.   isosorbide mononitrate 30 MG 24 hr tablet Commonly known as:  IMDUR Take 30 mg by mouth daily.   Lidocaine 4 % Ptch Apply 1 patch topically daily. Left Ribs- Remove patch after 12 hours   losartan 100 MG tablet Commonly known as:  COZAAR Take 100 mg by mouth daily.   methotrexate 2.5 MG tablet Commonly known as:  RHEUMATREX Take 6 tablets by mouth every Friday.   metoprolol tartrate 100 MG tablet Commonly known as:  LOPRESSOR Take 100 mg by mouth 2 (two) times daily.   mirtazapine 7.5 MG tablet Commonly known as:  REMERON Take 7.5 mg by mouth at bedtime.   MULTI-VITAMINS Tabs Take 1 tablet by mouth daily.   QUEtiapine 25 MG tablet Commonly known as:  SEROQUEL Take 25 mg by mouth daily.   sertraline 100 MG tablet Commonly known as:  ZOLOFT Take 100 mg by mouth daily.   traMADol 50 MG tablet Commonly known as:  ULTRAM Take 1 tablet (50 mg total) by mouth every 4 (four) hours as needed.   traMADol 50 MG tablet Commonly known as:  ULTRAM Take 1 tablet (50 mg total) by mouth 3 (three) times daily. for pain- ok to hold for sedation   vitamin C 1000 MG tablet Take 1,000 mg by mouth daily.     Vitamin D 2000 units tablet Take 2,000 Units by mouth daily.       Review of Systems  Unable to perform ROS: Dementia  Constitutional: Negative for activity change, appetite change, chills, diaphoresis and fever.  HENT: Negative for congestion, mouth sores, nosebleeds, postnasal drip, sneezing, sore throat, trouble swallowing and voice change.   Respiratory: Negative for apnea, cough, choking, chest tightness, shortness of breath and wheezing.   Cardiovascular: Negative for chest pain, palpitations and leg swelling.  Gastrointestinal: Negative for abdominal distention, abdominal pain, constipation, diarrhea and nausea.  Genitourinary: Negative for difficulty urinating, dysuria, frequency and urgency.  Musculoskeletal: Positive for arthralgias (typical arthritis) and gait problem. Negative for back pain and myalgias.  Skin: Positive for wound. Negative for color change, pallor and rash.  Neurological: Positive for weakness. Negative for dizziness, tremors, syncope, speech difficulty, numbness and headaches.  Psychiatric/Behavioral: Positive for confusion. Negative for agitation and behavioral problems.  All other systems reviewed and are negative.   Immunization History  Administered Date(s) Administered  . Influenza,inj,Quad PF,6+ Mos 01/22/2017  . Influenza-Unspecified 11/27/2013  . Pneumococcal Polysaccharide-23 03/27/2013   Pertinent  Health Maintenance Due  Topic Date Due  . DEXA SCAN  04/28/1997  . PNA vac Low Risk Adult (2 of 2 - PCV13) 03/27/2014  . INFLUENZA VACCINE  09/27/2017   No flowsheet data found. Functional Status Survey:    Vitals:   07/05/17 1002  BP: 134/60  Pulse: 82  Resp: 16  Temp: (!) 97 F (36.1 C)  TempSrc: Oral  SpO2: 98%  Weight: 140 lb (63.5 kg)  Height: 5\' 3"  (1.6 m)   Body mass index is 24.8 kg/m. Physical Exam  Constitutional: Vital signs are normal. She appears well-developed and well-nourished. She is active and cooperative. She  does not appear ill. No distress.  HENT:  Head: Normocephalic and atraumatic.  Mouth/Throat: Uvula is midline, oropharynx is clear and moist and mucous membranes are normal. Mucous membranes are not pale, not dry and not cyanotic.  Eyes: Pupils are equal, round, and reactive to light. Conjunctivae, EOM and lids are normal.  Neck:  Trachea normal, normal range of motion and full passive range of motion without pain. Neck supple. No JVD present. No tracheal deviation, no edema and no erythema present. No thyromegaly present.  Cardiovascular: Normal rate, regular rhythm, normal heart sounds, intact distal pulses and normal pulses. Exam reveals no gallop, no distant heart sounds and no friction rub.  No murmur heard. Pulses:      Dorsalis pedis pulses are 2+ on the right side, and 2+ on the left side.  No edema  Pulmonary/Chest: Effort normal and breath sounds normal. No accessory muscle usage. No respiratory distress. She has no decreased breath sounds. She has no wheezes. She has no rhonchi. She has no rales. She exhibits no tenderness.  Abdominal: Soft. Normal appearance and bowel sounds are normal. She exhibits no distension and no ascites. There is no tenderness.  Musculoskeletal: She exhibits no edema.       Right ankle: She exhibits decreased range of motion and swelling. Tenderness.  Expected osteoarthritis, stiffness; Bilateral Calves soft, supple. Negative Homan's Sign. B- pedal pulses equal; open wound on Right Foot- ongoing wound care  Neurological: She is alert. She has normal strength. She exhibits abnormal muscle tone. Coordination and gait abnormal.  Skin: Skin is warm and dry. She is not diaphoretic. No cyanosis. No pallor. Nails show no clubbing.  Open right foot wound- ongoing care. Followed by podiatry  Psychiatric: Thought content normal. Her affect is blunt. Her speech is delayed. She is slowed and withdrawn. Cognition and memory are impaired. She expresses impulsivity. She  exhibits abnormal recent memory.  Nursing note and vitals reviewed.   Labs reviewed: Recent Labs    05/03/17 0930 05/10/17 1200 06/05/17 0540  NA 135 139 138  K 3.2* 4.2 3.4*  CL 101 104 105  CO2 24 24 26   GLUCOSE 157* 90 86  BUN 13 14 26*  CREATININE 0.76 0.81 0.98  CALCIUM 8.3* 8.7* 8.6*  MG  --   --  2.0   Recent Labs    05/03/17 0930 05/10/17 1200 06/05/17 0540  AST 21 27 23   ALT 12* 17 16  ALKPHOS 62 63 72  BILITOT 0.6 0.7 0.6  PROT 6.2* 6.0* 6.6  ALBUMIN 3.0* 3.4* 3.4*   Recent Labs    05/03/17 0930 05/10/17 1200 06/05/17 0540  WBC 11.0 11.9* 8.4  NEUTROABS 8.3* 8.8* 4.8  HGB 10.8* 11.8* 11.4*  HCT 33.3* 35.4 34.8*  MCV 114.6* 112.7* 112.8*  PLT 235 284 241   Lab Results  Component Value Date   TSH 1.753 06/05/2017   No results found for: HGBA1C No results found for: CHOL, HDL, LDLCALC, LDLDIRECT, TRIG, CHOLHDL  Significant Diagnostic Results in last 30 days:  No results found.  Assessment/Plan [name] [name] was seen today for medical management of chronic issues.  Diagnoses and all orders for this visit:  Lumbar radiculitis  Rheumatoid arthritis without elevated rheumatoid factor (HCC)  Osteoporosis without current pathological fracture, unspecified osteoporosis type   Above listed conditions stable  Continue current medication regimen  Monitor for worsening pain  Safety precautions  Fall precautions  Mobile in wheelchair  F/U with Rheumatologist as needed  F/U with Orthopedist as instructed  Continue to be followed by Palliative Medicine  Family/ staff Communication:   Total Time:  Documentation:  Face to Face:  Family/Phone:   Labs/tests ordered: Not due. Recent labs reviewed, adjustments made   Medication list reviewed and assessed for continued appropriateness. Monthly medication orders reviewed and signed.  [name] Ports,  NP-C [facility] [address], [place] [address] Cell Phone (Mon-Fri 8am-5pm):  [phone] On Call:  [phone] & follow prompts after 5pm & weekends Office Phone:  [phone] Office Fax:  [phone]

## 2017-07-09 NOTE — Assessment & Plan Note (Signed)
Stable. On Methotrexate 2.5 mg x 6 tablets po Q Friday with 1 mg Folic acid daily along with Tylenol 1300 mg ER BID and Tramadol 50 mg TID and Q 4 hours prn. No recent c/o exacerbations

## 2017-07-09 NOTE — Assessment & Plan Note (Signed)
Stable. No recent c/o worsening pain. On Tramadol 50 mg po TID scheduled and Q 4 hours prn, and Tylenol 1300 mg ER BID.

## 2017-07-09 NOTE — Assessment & Plan Note (Signed)
Recent ankle fracture several months ago. On Cholecalciferol 2,000 untis po Q Day.

## 2017-07-18 DIAGNOSIS — I10 Essential (primary) hypertension: Secondary | ICD-10-CM | POA: Diagnosis not present

## 2017-07-18 DIAGNOSIS — R413 Other amnesia: Secondary | ICD-10-CM | POA: Diagnosis not present

## 2017-07-18 DIAGNOSIS — I272 Pulmonary hypertension, unspecified: Secondary | ICD-10-CM | POA: Diagnosis not present

## 2017-07-18 DIAGNOSIS — M0600 Rheumatoid arthritis without rheumatoid factor, unspecified site: Secondary | ICD-10-CM | POA: Diagnosis not present

## 2017-07-18 DIAGNOSIS — Z0000 Encounter for general adult medical examination without abnormal findings: Secondary | ICD-10-CM | POA: Diagnosis not present

## 2017-07-19 DIAGNOSIS — F0391 Unspecified dementia with behavioral disturbance: Secondary | ICD-10-CM | POA: Diagnosis not present

## 2017-07-19 DIAGNOSIS — Z515 Encounter for palliative care: Secondary | ICD-10-CM | POA: Diagnosis not present

## 2017-07-28 ENCOUNTER — Encounter
Admission: RE | Admit: 2017-07-28 | Discharge: 2017-07-28 | Disposition: A | Discharge status: Home / Self Care | Payer: PPO | Source: Ambulatory Visit | Attending: Internal Medicine | Admitting: Internal Medicine

## 2017-08-09 ENCOUNTER — Encounter: Payer: Self-pay | Admitting: Gerontology

## 2017-08-09 ENCOUNTER — Non-Acute Institutional Stay (SKILLED_NURSING_FACILITY): Payer: PPO | Admitting: Gerontology

## 2017-08-09 DIAGNOSIS — M47816 Spondylosis without myelopathy or radiculopathy, lumbar region: Secondary | ICD-10-CM | POA: Diagnosis not present

## 2017-08-09 DIAGNOSIS — L03115 Cellulitis of right lower limb: Secondary | ICD-10-CM

## 2017-08-09 DIAGNOSIS — M1990 Unspecified osteoarthritis, unspecified site: Secondary | ICD-10-CM | POA: Diagnosis not present

## 2017-08-21 ENCOUNTER — Non-Acute Institutional Stay (SKILLED_NURSING_FACILITY): Payer: PPO | Admitting: Gerontology

## 2017-08-21 DIAGNOSIS — T8130XA Disruption of wound, unspecified, initial encounter: Secondary | ICD-10-CM

## 2017-08-21 DIAGNOSIS — S91301D Unspecified open wound, right foot, subsequent encounter: Secondary | ICD-10-CM | POA: Diagnosis not present

## 2017-08-22 ENCOUNTER — Encounter: Payer: Self-pay | Admitting: Gerontology

## 2017-08-22 NOTE — Progress Notes (Signed)
Location:   [facility] Room Number: [phone] Place of Service:  SNF [id number]) Provider:  Toni Arthurs, NP-C  [place]par[place], [name], MD  [name], MD as PCP - General (Internal Medicine)  Extended Emergency Contact Information Primary Emergency Contact: [name],[name] Address: [place] Phone: [phone] Work Phone: [phone] Relation: Sister Secondary Emergency Contact: [place] Mobile Phone: [phone] Relation: Niece  Code Status:  DNR Goals of care: Advanced Directive information Advanced Directives 08/22/2017  Does Patient Have a Medical Advance Directive? Yes  Type of Paramedic of [place];Out of facility DNR (pink MOST or yellow form)  Does patient want to make changes to medical advance directive? No - Patient declined  Copy of [place] in Chart? Yes  Would patient like information on creating a medical advance directive? -  Pre-existing out of facility DNR order (yellow form or pink MOST form) Yellow form placed in chart (order not valid for inpatient use)     Chief Complaint  Patient presents with  . Acute Visit    Foot    HPI:  Pt is a 84 y.o. female seen today for an acute visit for evaluation of wound dehiscence. Last week, it was noted the wound on the dorsal aspect of the right foot that was open, with tendon exposure had finally closed. Skin was pink and tender but closed. Today, area approx. 4.5 x 3.5 cm on the dorsal aspect of the foot is again opened. The wound bed appears pink, moist with granulating tissue. Peri-wound tissue appears red and ruddy. Pt reports foot is tender. She appears somewhat discouraged at the re-opening of the wound. Pulse is weak, but palpable in the foot- at baseline. Toes warm, cap refills WNL. RLE non-tender. Toes full ROM. Afebrile. Will monitor for infection, but does not appear to  have any infection at the time. VSS. No other complaints.      Past Medical History:  Diagnosis Date  . breast cancer   . DVT (deep venous thrombosis) ([place]) 06/2005   RLE ; treated x 3 months with Coumadin  . Fibrocystic breast disease    Severe; status post bilateral mastectomies and implants  . GERD (gastroesophageal reflux disease)    Severe; manifested vocal cord irritation, status-post Nissen fundcoplication in 08/6732  . History of ataxia    with questionable right upper and lower  . History of breast cancer   . History of exertional chest pain    with her last stress echo in 09/1999 being normal  . History of migraine    vascular  . History of shingles   . History of syncope 12/1999   With negative cardiac workup to include an echocardiogram, which revealed only mild to moderate MR, a CT scan of the chest, which was negative, and a Cardiolite stress test, which was normal  . Hyperlipidemia, unspecified   . Hypertension    CTA 05/2004 showed no renovascular cause  . Mild dementia   . Muscle atrophy    Positive FANA and anti- SCL70 antibodies; S/p biopsy without eosinic fascitis; Remicade discontinued  . Osteoarthritis    a. Cervical spine. b. Lumbar spine/spinal stenosis. [facility])  . Osteoporosis    a. Fosamax b. Right wrist fracture. c. Sternal fracture ([place])  . Rheumatoid arthritis without elevated rheumatoid factor (HCC)    a. Methotrexate. b. S/p Remicade. c.  Seronegative. d. S/p Plaquenil. [facility])   Past Surgical History:  Procedure Laterality Date  . APPENDECTOMY    . CHOLECYSTECTOMY    . IRRIGATION AND DEBRIDEMENT ABSCESS Right 04/08/2017   Procedure: IRRIGATION AND DEBRIDEMENT ABSCESS with wound vac;  Surgeon: Albertine Patricia, DPM;  Location: [facility] ORS;  Service: Podiatry;  Laterality: Right;  . LAPAROSCOPIC NISSEN FUNDOPLICATION  70/9628  . LUMBAR LAMINECTOMY     for spinal stenosis  . MASTECTOMY Bilateral    with implants  . ORIF DISTAL RADIUS FRACTURE Right  07/27/2008   with volar plate  . TOTAL ABDOMINAL HYSTERECTOMY W/ BILATERAL SALPINGOOPHORECTOMY    . VASCULAR SURGERY      Allergies  Allergen Reactions  . Atorvastatin Other (See Comments)    Myositis  . Bisphosphonates Other (See Comments)    GI upset  . Codeine Other (See Comments)  . Memantine Other (See Comments)  . Nsaids Other (See Comments)    GI upset  . Omeprazole Nausea Only  . Other Other (See Comments)    Leg cramps GI upset  . Salsalate Other (See Comments)  . Sulfa Antibiotics     Pt doesn't remember  . Tamoxifen Other (See Comments)    Leg cramps  . Penicillins Itching    Has patient had a PCN reaction causing immediate rash, facial/tongue/throat swelling, SOB or lightheadedness with hypotension: Yes Has patient had a PCN reaction causing severe rash involving mucus membranes or skin necrosis: No Has patient had a PCN reaction that required hospitalization: No Has patient had a PCN reaction occurring within the last 10 years: No If all of the above answers are "NO", then may proceed with Cephalosporin use.    Allergies as of 08/21/2017      Reactions   Atorvastatin Other (See Comments)   Myositis   Bisphosphonates Other (See Comments)   GI upset   Codeine Other (See Comments)   Memantine Other (See Comments)   Nsaids Other (See Comments)   GI upset   Omeprazole Nausea Only   Other Other (See Comments)   Leg cramps GI upset   Salsalate Other (See Comments)   Sulfa Antibiotics    Pt doesn't remember   Tamoxifen Other (See Comments)   Leg cramps   Penicillins Itching   Has patient had a PCN reaction causing immediate rash, facial/tongue/throat swelling, SOB or lightheadedness with hypotension: Yes Has patient had a PCN reaction causing severe rash involving mucus membranes or skin necrosis: No Has patient had a PCN reaction that required hospitalization: No Has patient had a PCN reaction occurring within the last 10 years: No If all of the above  answers are "NO", then may proceed with Cephalosporin use.      Medication List        Accurate as of 08/21/17 11:59 PM. Always use your most recent med list.          acetaminophen 650 MG CR tablet Commonly known as:  TYLENOL Take 1,300 mg by mouth 2 (two) times daily.   amLODipine 10 MG tablet Commonly known as:  NORVASC Take 10 mg by mouth daily.   aspirin EC 81 MG tablet Take 81 mg by mouth daily.   cyanocobalamin 1000 MCG tablet Take 1,000 mcg by mouth daily.   docusate sodium 100 MG capsule Commonly known as:  COLACE Take 1 capsule (100 mg total) by mouth 2 (two) times daily.   folic acid 1 MG tablet Commonly known as:  FOLVITE Take 1 mg by mouth  daily.   ipratropium-albuterol 0.5-2.5 (3) MG/3ML Soln Commonly known as:  DUONEB Take 3 mLs by nebulization every 6 (six) hours as needed.   isosorbide mononitrate 30 MG 24 hr tablet Commonly known as:  IMDUR Take 30 mg by mouth daily.   Lidocaine 4 % Ptch Apply 1 patch topically daily. Left Ribs- Remove patch after 12 hours   losartan 100 MG tablet Commonly known as:  COZAAR Take 100 mg by mouth daily.   methotrexate 2.5 MG tablet Commonly known as:  RHEUMATREX Take 6 tablets by mouth every Friday.   metoprolol tartrate 100 MG tablet Commonly known as:  LOPRESSOR Take 100 mg by mouth 2 (two) times daily.   mirtazapine 7.5 MG tablet Commonly known as:  REMERON Take 7.5 mg by mouth at bedtime.   MULTI-VITAMINS Tabs Take 1 tablet by mouth daily.   potassium chloride 10 MEQ CR capsule Commonly known as:  MICRO-K Take 10 mEq by mouth daily.   sertraline 100 MG tablet Commonly known as:  ZOLOFT Take 100 mg by mouth daily.   traMADol 50 MG tablet Commonly known as:  ULTRAM Take 1 tablet (50 mg total) by mouth every 4 (four) hours as needed.   traMADol 50 MG tablet Commonly known as:  ULTRAM Take 1 tablet (50 mg total) by mouth 3 (three) times daily. for pain- ok to hold for sedation   vitamin C  1000 MG tablet Take 1,000 mg by mouth daily.   Vitamin D 2000 units tablet Take 2,000 Units by mouth daily.       Review of Systems  Constitutional: Negative for activity change, appetite change, chills, diaphoresis, fatigue and fever.  HENT: Negative.   Respiratory: Negative.   Cardiovascular: Negative.   Gastrointestinal: Negative.   Genitourinary: Negative.   Skin: Positive for wound.  Neurological: Negative.   Psychiatric/Behavioral: Negative.   All other systems reviewed and are negative.   Immunization History  Administered Date(s) Administered  . Influenza,inj,Quad PF,6+ Mos 01/22/2017  . Influenza-Unspecified 11/27/2013  . Pneumococcal Polysaccharide-23 03/27/2013   Pertinent  Health Maintenance Due  Topic Date Due  . DEXA SCAN  04/28/1997  . PNA vac Low Risk Adult (2 of 2 - PCV13) 03/27/2014  . INFLUENZA VACCINE  09/27/2017   No flowsheet data found. Functional Status Survey:    Vitals:   08/21/17 1428  BP: 130/80  Pulse: 82  Resp: 18  Temp: (!) 97 F (36.1 C)  TempSrc: Oral  SpO2: 97%  Weight: 139 lb 14.4 oz (63.5 kg)  Height: 5\' 3"  (1.6 m)   Body mass index is 24.78 kg/m. Physical Exam  Constitutional: Vital signs are normal. She appears well-developed and well-nourished. She is active and cooperative. She does not appear ill. No distress.  HENT:  Head: Normocephalic and atraumatic.  Mouth/Throat: Uvula is midline, oropharynx is clear and moist and mucous membranes are normal. Mucous membranes are not pale, not dry and not cyanotic.  Eyes: Pupils are equal, round, and reactive to light. Conjunctivae, EOM and lids are normal.  Neck: Trachea normal, normal range of motion and full passive range of motion without pain. Neck supple. No JVD present. No tracheal deviation, no edema and no erythema present. No thyromegaly present.  Cardiovascular: Normal rate, regular rhythm, normal heart sounds, intact distal pulses and normal pulses. Exam reveals no  gallop, no distant heart sounds and no friction rub.  No murmur heard. Pulses:      Dorsalis pedis pulses are 2+ on the right side,  and 2+ on the left side.  No edema  Pulmonary/Chest: Effort normal and breath sounds normal. No accessory muscle usage. No respiratory distress. She has no decreased breath sounds. She has no wheezes. She has no rhonchi. She has no rales. She exhibits no tenderness.  Abdominal: Soft. Normal appearance and bowel sounds are normal. She exhibits no distension and no ascites. There is no tenderness.  Musculoskeletal: Normal range of motion. She exhibits no edema or tenderness.  Expected osteoarthritis, stiffness; Bilateral Calves soft, supple. Negative Homan's Sign. B- pedal pulses equal; generalized weakness, mobile in wheelchair  Neurological: She is alert. She has normal strength. She exhibits abnormal muscle tone. Coordination and gait abnormal.  Skin: Skin is warm and dry. She is not diaphoretic. No cyanosis. No pallor. Nails show no clubbing.     Psychiatric: Her speech is normal and behavior is normal. Her affect is blunt. Cognition and memory are impaired. She expresses impulsivity. She exhibits abnormal recent memory.  Nursing note and vitals reviewed.   Labs reviewed: Recent Labs    05/03/17 0930 05/10/17 1200 06/05/17 0540  NA 135 139 138  K 3.2* 4.2 3.4*  CL 101 104 105  CO2 24 24 26   GLUCOSE 157* 90 86  BUN 13 14 26*  CREATININE 0.76 0.81 0.98  CALCIUM 8.3* 8.7* 8.6*  MG  --   --  2.0   Recent Labs    05/03/17 0930 05/10/17 1200 06/05/17 0540  AST 21 27 23   ALT 12* 17 16  ALKPHOS 62 63 72  BILITOT 0.6 0.7 0.6  PROT 6.2* 6.0* 6.6  ALBUMIN 3.0* 3.4* 3.4*   Recent Labs    05/03/17 0930 05/10/17 1200 06/05/17 0540  WBC 11.0 11.9* 8.4  NEUTROABS 8.3* 8.8* 4.8  HGB 10.8* 11.8* 11.4*  HCT 33.3* 35.4 34.8*  MCV 114.6* 112.7* 112.8*  PLT 235 284 241   Lab Results  Component Value Date   TSH 1.753 06/05/2017   No results found  for: HGBA1C No results found for: CHOL, HDL, LDLCALC, LDLDIRECT, TRIG, CHOLHDL  Significant Diagnostic Results in last 30 days:  No results found.  Assessment/Plan  Open wound of foot except toes with complication, right, subsequent encounter  Wound dehiscence  New dressing change orders  Cleanse top of foot with NS  Apply skin prep to the peri-wound area  Apply piece of Aquacell AG to the wound bed  Cover with Allevyn dressing  Change daily  Continue with nutritional supplements  WOC RN to follow  Continue NWB without walking shoe to the foot  Monitor for pain  Monitor for s/s of infection  Family/ staff Communication:   Total Time:  Documentation:  Face to Face:  Family/Phone:   Labs/tests ordered:    Medication list reviewed and assessed for continued appropriateness.  Vikki Ports, NP-C Geriatrics [facility] [phone], [place] [address] Cell Phone (Mon-Fri 8am-5pm):  [phone] On Call:  [phone] & follow prompts after 5pm & weekends Office Phone:  [phone] Office Fax:  [phone]

## 2017-08-27 ENCOUNTER — Encounter
Admission: RE | Admit: 2017-08-27 | Discharge: 2017-08-27 | Disposition: A | Discharge status: Home / Self Care | Payer: PPO | Source: Ambulatory Visit | Attending: Internal Medicine | Admitting: Internal Medicine

## 2017-08-27 DIAGNOSIS — S91301D Unspecified open wound, right foot, subsequent encounter: Secondary | ICD-10-CM | POA: Insufficient documentation

## 2017-08-27 DIAGNOSIS — T8130XA Disruption of wound, unspecified, initial encounter: Secondary | ICD-10-CM | POA: Insufficient documentation

## 2017-08-27 DIAGNOSIS — I10 Essential (primary) hypertension: Secondary | ICD-10-CM | POA: Insufficient documentation

## 2017-08-27 NOTE — Assessment & Plan Note (Signed)
Stable.  Pain controlled with Tylenol and Ultram.  Patient does have generalized weakness and is mobile with wheelchair.  Lidocaine patch 4% to the back daily.

## 2017-08-27 NOTE — Assessment & Plan Note (Signed)
Stable.  Symptoms controlled with Tylenol CR 650 mg 2 tablets p.o. twice daily and tramadol 50 mg p.o. 3 times daily for pain.  No complaints of breakthrough pain.

## 2017-08-27 NOTE — Assessment & Plan Note (Signed)
Stable.  Cellulitis appears to have completely resolved.  Skin is closed with new scarring.  Patient reports foot is feeling much better/nontender.  Patient has been compliant with the walking shoe.  No redness or warmth.

## 2017-08-27 NOTE — Progress Notes (Signed)
Location:    Nursing Home Room Number: 277A Place of Service:  SNF (31) Provider:  Toni Arthurs, NP-C  Sparks, [name] Douglas, MD  Patient Care Team: [name] Crouch, MD as PCP - General (Internal Medicine)  Extended Emergency Contact Information Primary Emergency Contact: [name],[name] Address: [place] Phone: [phone] Work Phone: [phone] Relation: Sister Secondary Emergency Contact: [place] Mobile Phone: [phone] Relation: Niece  Code Status: DNR Goals of care: Advanced Directive information Advanced Directives 08/22/2017  Does Patient Have a Medical Advance Directive? Yes  Type of Paramedic of [place];Out of facility DNR (pink MOST or yellow form)  Does patient want to make changes to medical advance directive? No - Patient declined  Copy of [place] in Chart? Yes  Would patient like information on creating a medical advance directive? -  Pre-existing out of facility DNR order (yellow form or pink MOST form) Yellow form placed in chart (order not valid for inpatient use)     Chief Complaint  Patient presents with  . Medical Management of Chronic Issues    Routine Visit    HPI:  Pt is a 84 y.o. female seen today for medical management of chronic diseases.    Osteoarthritis Stable.  Symptoms controlled with Tylenol CR 650 mg 2 tablets p.o. twice daily and tramadol 50 mg p.o. 3 times daily for pain.  No complaints of breakthrough pain.  Lumbar spondylosis Stable.  Pain controlled with Tylenol and Ultram.  Patient does have generalized weakness and is mobile with wheelchair.  Lidocaine patch 4% to the back daily.  Cellulitis of right foot Stable.  Cellulitis appears to have completely resolved.  Skin is closed with new scarring.  Patient reports foot is feeling much better/nontender.  Patient has been compliant with the walking shoe.  No redness or  warmth.  Please note pt with limited verbal/cognitive ability. Unable to obtain complete ROS. Some ROS info obtained from staff and documentation.   Past Medical History:  Diagnosis Date  . breast cancer   . DVT (deep venous thrombosis) ([place]) 06/2005   RLE ; treated x 3 months with Coumadin  . Fibrocystic breast disease    Severe; status post bilateral mastectomies and implants  . GERD (gastroesophageal reflux disease)    Severe; manifested vocal cord irritation, status-post Nissen fundcoplication in 06/352  . History of ataxia    with questionable right upper and lower  . History of breast cancer   . History of exertional chest pain    with her last stress echo in 09/1999 being normal  . History of migraine    vascular  . History of shingles   . History of syncope 12/1999   With negative cardiac workup to include an echocardiogram, which revealed only mild to moderate MR, a CT scan of the chest, which was negative, and a Cardiolite stress test, which was normal  . Hyperlipidemia, unspecified   . Hypertension    CTA 05/2004 showed no renovascular cause  . Mild dementia   . Muscle atrophy    Positive FANA and anti- SCL70 antibodies; S/p biopsy without eosinic fascitis; Remicade discontinued  . Osteoarthritis    a. Cervical spine. b. Lumbar spine/spinal stenosis. [facility])  . Osteoporosis    a. Fosamax b. Right wrist fracture. c. Sternal fracture ([place])  . Rheumatoid arthritis without elevated rheumatoid factor (HCC)    a. Methotrexate. b.  S/p Remicade. c. Seronegative. d. S/p Plaquenil. [facility])   Past Surgical History:  Procedure Laterality Date  . APPENDECTOMY    . CHOLECYSTECTOMY    . IRRIGATION AND DEBRIDEMENT ABSCESS Right 04/08/2017   Procedure: IRRIGATION AND DEBRIDEMENT ABSCESS with wound vac;  Surgeon: [name] [name], DPM;  Location: [facility] ORS;  Service: Podiatry;  Laterality: Right;  . LAPAROSCOPIC NISSEN FUNDOPLICATION  16/1096  . LUMBAR LAMINECTOMY     for spinal  stenosis  . MASTECTOMY Bilateral    with implants  . ORIF DISTAL RADIUS FRACTURE Right 07/27/2008   with volar plate  . TOTAL ABDOMINAL HYSTERECTOMY W/ BILATERAL SALPINGOOPHORECTOMY    . VASCULAR SURGERY      Allergies  Allergen Reactions  . Atorvastatin Other (See Comments)    Myositis  . Bisphosphonates Other (See Comments)    GI upset  . Codeine Other (See Comments)  . Memantine Other (See Comments)  . Nsaids Other (See Comments)    GI upset  . Omeprazole Nausea Only  . Other Other (See Comments)    Leg cramps GI upset  . Salsalate Other (See Comments)  . Sulfa Antibiotics     Pt doesn't remember  . Tamoxifen Other (See Comments)    Leg cramps  . Penicillins Itching    Has patient had a PCN reaction causing immediate rash, facial/tongue/throat swelling, SOB or lightheadedness with hypotension: Yes Has patient had a PCN reaction causing severe rash involving mucus membranes or skin necrosis: No Has patient had a PCN reaction that required hospitalization: No Has patient had a PCN reaction occurring within the last 10 years: No If all of the above answers are "NO", then may proceed with Cephalosporin use.    Allergies as of 08/09/2017      Reactions   Atorvastatin Other (See Comments)   Myositis   Bisphosphonates Other (See Comments)   GI upset   Codeine Other (See Comments)   Memantine Other (See Comments)   Nsaids Other (See Comments)   GI upset   Omeprazole Nausea Only   Other Other (See Comments)   Leg cramps GI upset   Salsalate Other (See Comments)   Sulfa Antibiotics    Pt doesn't remember   Tamoxifen Other (See Comments)   Leg cramps   Penicillins Itching   Has patient had a PCN reaction causing immediate rash, facial/tongue/throat swelling, SOB or lightheadedness with hypotension: Yes Has patient had a PCN reaction causing severe rash involving mucus membranes or skin necrosis: No Has patient had a PCN reaction that required hospitalization: No Has  patient had a PCN reaction occurring within the last 10 years: No If all of the above answers are "NO", then may proceed with Cephalosporin use.      Medication List        Accurate as of 08/09/17 11:59 PM. Always use your most recent med list.          acetaminophen 650 MG CR tablet Commonly known as:  TYLENOL Take 1,300 mg by mouth 2 (two) times daily.   amLODipine 10 MG tablet Commonly known as:  NORVASC Take 10 mg by mouth daily.   aspirin EC 81 MG tablet Take 81 mg by mouth daily.   cyanocobalamin 1000 MCG tablet Take 1,000 mcg by mouth daily.   docusate sodium 100 MG capsule Commonly known as:  COLACE Take 1 capsule (100 mg total) by mouth 2 (two) times daily.   folic acid 1 MG tablet Commonly known as:  FOLVITE Take 1  mg by mouth daily.   ipratropium-albuterol 0.5-2.5 (3) MG/3ML Soln Commonly known as:  DUONEB Take 3 mLs by nebulization every 6 (six) hours as needed.   isosorbide mononitrate 30 MG 24 hr tablet Commonly known as:  IMDUR Take 30 mg by mouth daily.   Lidocaine 4 % Ptch Apply 1 patch topically daily. Left Ribs- Remove patch after 12 hours   losartan 100 MG tablet Commonly known as:  COZAAR Take 100 mg by mouth daily.   methotrexate 2.5 MG tablet Commonly known as:  RHEUMATREX Take 6 tablets by mouth every Friday.   metoprolol tartrate 100 MG tablet Commonly known as:  LOPRESSOR Take 100 mg by mouth 2 (two) times daily.   mirtazapine 7.5 MG tablet Commonly known as:  REMERON Take 7.5 mg by mouth at bedtime.   MULTI-VITAMINS Tabs Take 1 tablet by mouth daily.   potassium chloride 10 MEQ CR capsule Commonly known as:  MICRO-K Take 10 mEq by mouth daily.   sertraline 100 MG tablet Commonly known as:  ZOLOFT Take 100 mg by mouth daily.   traMADol 50 MG tablet Commonly known as:  ULTRAM Take 1 tablet (50 mg total) by mouth every 4 (four) hours as needed.   traMADol 50 MG tablet Commonly known as:  ULTRAM Take 1 tablet (50 mg  total) by mouth 3 (three) times daily. for pain- ok to hold for sedation   vitamin C 1000 MG tablet Take 1,000 mg by mouth daily.   Vitamin D 2000 units tablet Take 2,000 Units by mouth daily.       Review of Systems  Unable to perform ROS: Dementia  Constitutional: Negative for activity change, appetite change, chills, diaphoresis and fever.  HENT: Negative for congestion, mouth sores, nosebleeds, postnasal drip, sneezing, sore throat, trouble swallowing and voice change.   Respiratory: Negative for apnea, cough, choking, chest tightness, shortness of breath and wheezing.   Cardiovascular: Negative for chest pain, palpitations and leg swelling.  Gastrointestinal: Negative for abdominal distention, abdominal pain, constipation, diarrhea and nausea.  Genitourinary: Negative for difficulty urinating, dysuria, frequency and urgency.  Musculoskeletal: Positive for arthralgias (typical arthritis), back pain and gait problem. Negative for myalgias.  Skin: Positive for wound. Negative for color change, pallor and rash.  Neurological: Positive for weakness. Negative for dizziness, tremors, syncope, speech difficulty, numbness and headaches.  Psychiatric/Behavioral: Negative for agitation and behavioral problems.  All other systems reviewed and are negative.   Immunization History  Administered Date(s) Administered  . Influenza,inj,Quad PF,6+ Mos 01/22/2017  . Influenza-Unspecified 11/27/2013  . Pneumococcal Polysaccharide-23 03/27/2013   Pertinent  Health Maintenance Due  Topic Date Due  . DEXA SCAN  04/28/1997  . PNA vac Low Risk Adult (2 of 2 - PCV13) 03/27/2014  . INFLUENZA VACCINE  09/27/2017   No flowsheet data found. Functional Status Survey:    Vitals:   08/09/17 1453  BP: 130/80  Pulse: 78  Resp: 18  Temp: (!) 97 F (36.1 C)  TempSrc: Oral  SpO2: 97%  Weight: 139 lb 9.6 oz (63.3 kg)  Height: 5\' 3"  (1.6 m)   Body mass index is 24.73 kg/m. Physical Exam    Constitutional: She is oriented to person, place, and time. Vital signs are normal. She appears well-developed and well-nourished. She is active and cooperative. She does not appear ill. No distress.  HENT:  Head: Normocephalic and atraumatic.  Mouth/Throat: Uvula is midline, oropharynx is clear and moist and mucous membranes are normal. Mucous membranes are not pale,  not dry and not cyanotic.  Eyes: Pupils are equal, round, and reactive to light. Conjunctivae, EOM and lids are normal.  Neck: Trachea normal, normal range of motion and full passive range of motion without pain. Neck supple. No JVD present. No tracheal deviation, no edema and no erythema present. No thyromegaly present.  Cardiovascular: Regular rhythm, normal heart sounds, intact distal pulses and normal pulses. Bradycardia present. Exam reveals no gallop, no distant heart sounds and no friction rub.  No murmur heard. Pulses:      Dorsalis pedis pulses are 2+ on the right side, and 2+ on the left side.  No edema  Pulmonary/Chest: Effort normal and breath sounds normal. No accessory muscle usage. No respiratory distress. She has no decreased breath sounds. She has no wheezes. She has no rhonchi. She has no rales. She exhibits no tenderness.  Abdominal: Soft. Normal appearance and bowel sounds are normal. She exhibits no distension and no ascites. There is no tenderness.  Musculoskeletal: Normal range of motion. She exhibits no edema or tenderness.  Expected osteoarthritis, stiffness; Bilateral Calves soft, supple. Negative Homan's Sign. B- pedal pulses equal; generalized weakness, mobile in wheelchair.  Walking shoe to the right foot  Neurological: She is alert and oriented to person, place, and time. She has normal strength.  Skin: Skin is warm, dry and intact. She is not diaphoretic. No cyanosis. No pallor. Nails show no clubbing.  Right foot cellulitis/open wound with tendon exposure completely healed over/closed  Psychiatric:  Her speech is normal. Thought content normal. Her affect is blunt. She is slowed. Cognition and memory are impaired. She expresses impulsivity. She exhibits abnormal recent memory.  Nursing note and vitals reviewed.   Labs reviewed: Recent Labs    05/03/17 0930 05/10/17 1200 06/05/17 0540  NA 135 139 138  K 3.2* 4.2 3.4*  CL 101 104 105  CO2 24 24 26   GLUCOSE 157* 90 86  BUN 13 14 26*  CREATININE 0.76 0.81 0.98  CALCIUM 8.3* 8.7* 8.6*  MG  --   --  2.0   Recent Labs    05/03/17 0930 05/10/17 1200 06/05/17 0540  AST 21 27 23   ALT 12* 17 16  ALKPHOS 62 63 72  BILITOT 0.6 0.7 0.6  PROT 6.2* 6.0* 6.6  ALBUMIN 3.0* 3.4* 3.4*   Recent Labs    05/03/17 0930 05/10/17 1200 06/05/17 0540  WBC 11.0 11.9* 8.4  NEUTROABS 8.3* 8.8* 4.8  HGB 10.8* 11.8* 11.4*  HCT 33.3* 35.4 34.8*  MCV 114.6* 112.7* 112.8*  PLT 235 284 241   Lab Results  Component Value Date   TSH 1.753 06/05/2017   No results found for: HGBA1C No results found for: CHOL, HDL, LDLCALC, LDLDIRECT, TRIG, CHOLHDL  Significant Diagnostic Results in last 30 days:  No results found.  Assessment/Plan [name] [name] was seen today for medical management of chronic issues.  Diagnoses and all orders for this visit:  Cellulitis of right foot  Lumbar spondylosis  Osteoarthritis, unspecified osteoarthritis type, unspecified site   Above listed condition stable  Continue current medication regimen  Continue use of walking shoe  Continue to monitor skin on the top of the foot  Monitor for worsening pain  Continue to encourage participation in activities and interaction with other residents  Assist with ADLs as appropriate  Safety precautions  Fall precautions  Patient to continue to be followed by palliative medicine  Family/ staff Communication:   Total Time:  Documentation:  Face to Face:  Family/Phone:  Labs/tests ordered: Not due  Medication list reviewed and assessed for continued  appropriateness. Monthly medication orders reviewed and signed.  Vikki Ports, NP-C Geriatrics [facility] [phone], [place] [address] Cell Phone (Mon-Fri 8am-5pm):  [phone] On Call:  [phone] & follow prompts after 5pm & weekends Office Phone:  [phone] Office Fax:  [phone]

## 2017-09-13 ENCOUNTER — Non-Acute Institutional Stay (SKILLED_NURSING_FACILITY): Payer: PPO | Admitting: Adult Health

## 2017-09-13 ENCOUNTER — Encounter: Payer: Self-pay | Admitting: Adult Health

## 2017-09-13 DIAGNOSIS — I10 Essential (primary) hypertension: Secondary | ICD-10-CM

## 2017-09-13 DIAGNOSIS — M0600 Rheumatoid arthritis without rheumatoid factor, unspecified site: Secondary | ICD-10-CM | POA: Diagnosis not present

## 2017-09-13 DIAGNOSIS — E876 Hypokalemia: Secondary | ICD-10-CM | POA: Diagnosis not present

## 2017-09-13 DIAGNOSIS — I272 Pulmonary hypertension, unspecified: Secondary | ICD-10-CM | POA: Diagnosis not present

## 2017-09-13 DIAGNOSIS — R0781 Pleurodynia: Secondary | ICD-10-CM | POA: Diagnosis not present

## 2017-09-13 DIAGNOSIS — K5909 Other constipation: Secondary | ICD-10-CM

## 2017-09-13 DIAGNOSIS — F320 Major depressive disorder, single episode, mild: Secondary | ICD-10-CM

## 2017-09-13 NOTE — Progress Notes (Signed)
Location:   [facility] Room Number: [place] of Service:  SNF (31)   CODE STATUS: DNR  Allergies  Allergen Reactions  . Atorvastatin Other (See Comments)    Myositis  . Bisphosphonates Other (See Comments)    GI upset  . Codeine Other (See Comments)  . Memantine Other (See Comments)  . Nsaids Other (See Comments)    GI upset  . Omeprazole Nausea Only  . Other Other (See Comments)    Leg cramps GI upset  . Salsalate Other (See Comments)  . Sulfa Antibiotics     Pt doesn't remember  . Tamoxifen Other (See Comments)    Leg cramps  . Penicillins Itching    Has patient had a PCN reaction causing immediate rash, facial/tongue/throat swelling, SOB or lightheadedness with hypotension: Yes Has patient had a PCN reaction causing severe rash involving mucus membranes or skin necrosis: No Has patient had a PCN reaction that required hospitalization: No Has patient had a PCN reaction occurring within the last 10 years: No If all of the above answers are "NO", then may proceed with Cephalosporin use.    Chief Complaint  Patient presents with  . Medical Management of Chronic Issues    Hypertension; pulmonary hypertension; RA    HPI:  She is a 84 year old long term resident of this facility being seen for the management of her chronic illnesses: hypertension; pulmonary hypertension; RA. She denies chest pain or shortness of breath; denies any change in appetite. There are no nursing concerns at this time.    Past Medical History:  Diagnosis Date  . breast cancer   . DVT (deep venous thrombosis) ([place]) 06/2005   RLE ; treated x 3 months with Coumadin  . Fibrocystic breast disease    Severe; status post bilateral mastectomies and implants  . GERD (gastroesophageal reflux disease)    Severe; manifested vocal cord irritation, status-post Nissen fundcoplication in 02/6107  . History of ataxia    with questionable right upper and lower  . History of  breast cancer   . History of exertional chest pain    with her last stress echo in 09/1999 being normal  . History of migraine    vascular  . History of shingles   . History of syncope 12/1999   With negative cardiac workup to include an echocardiogram, which revealed only mild to moderate MR, a CT scan of the chest, which was negative, and a Cardiolite stress test, which was normal  . Hyperlipidemia, unspecified   . Hypertension    CTA 05/2004 showed no renovascular cause  . Mild dementia   . Muscle atrophy    Positive FANA and anti- SCL70 antibodies; S/p biopsy without eosinic fascitis; Remicade discontinued  . Osteoarthritis    a. Cervical spine. b. Lumbar spine/spinal stenosis. [facility])  . Osteoporosis    a. Fosamax b. Right wrist fracture. c. Sternal fracture ([place])  . Rheumatoid arthritis without elevated rheumatoid factor (HCC)    a. Methotrexate. b. S/p Remicade. c. Seronegative. d. S/p Plaquenil. [facility])    Past Surgical History:  Procedure Laterality Date  . APPENDECTOMY    . CHOLECYSTECTOMY    . IRRIGATION AND DEBRIDEMENT ABSCESS Right 04/08/2017   Procedure: IRRIGATION AND DEBRIDEMENT ABSCESS with wound vac;  Surgeon: Albertine Patricia, DPM;  Location: [facility] ORS;  Service: Podiatry;  Laterality: Right;  . LAPAROSCOPIC NISSEN FUNDOPLICATION  60/4540  . LUMBAR LAMINECTOMY     for spinal stenosis  .  MASTECTOMY Bilateral    with implants  . ORIF DISTAL RADIUS FRACTURE Right 07/27/2008   with volar plate  . TOTAL ABDOMINAL HYSTERECTOMY W/ BILATERAL SALPINGOOPHORECTOMY    . VASCULAR SURGERY      Social History   Socioeconomic History  . Marital status: Widowed    Spouse name: Not on file  . Number of children: 0  . Years of education: 55  . Highest education level: High school graduate  Occupational History  . Not on file  Social Needs  . Financial resource strain: Not on file  . Food insecurity:    Worry: Not on file    Inability: Not on file  . Transportation needs:     Medical: Not on file    Non-medical: Not on file  Tobacco Use  . Smoking status: Never Smoker  . Smokeless tobacco: Never Used  Substance and Sexual Activity  . Alcohol use: No  . Drug use: No  . Sexual activity: Not Currently  Lifestyle  . Physical activity:    Days per week: Not on file    Minutes per session: Not on file  . Stress: Not on file  Relationships  . Social connections:    Talks on phone: Not on file    Gets together: Not on file    Attends religious service: Not on file    Active member of club or organization: Not on file    Attends meetings of clubs or organizations: Not on file    Relationship status: Not on file  . Intimate partner violence:    Fear of current or ex partner: Not on file    Emotionally abused: Not on file    Physically abused: Not on file    Forced sexual activity: Not on file  Other Topics Concern  . Not on file  Social History Narrative  . Not on file   Family History  Problem Relation Age of Onset  . Heart failure Mother   . Hypertension Mother   . Cancer Father   . Heart attack Sister       VITAL SIGNS BP 137/66   Pulse 62   Temp 98.4 F (36.9 C)   Resp 16   Ht 5\' 3"  (1.6 m)   Wt 140 lb (63.5 kg)   SpO2 99%   BMI 24.80 kg/m   Outpatient Encounter Medications as of 09/13/2017  Medication Sig  . acetaminophen (TYLENOL) 650 MG CR tablet Take 1,300 mg by mouth 2 (two) times daily.   Marland Kitchen amLODipine (NORVASC) 10 MG tablet Take 10 mg by mouth daily.  . Ascorbic Acid (VITAMIN C) 1000 MG tablet Take 1,000 mg by mouth daily.  Marland Kitchen aspirin EC 81 MG tablet Take 81 mg by mouth daily.   . Cholecalciferol (VITAMIN D) 2000 units tablet Take 2,000 Units by mouth daily.  . cyanocobalamin 1000 MCG tablet Take 1,000 mcg by mouth daily.  Marland Kitchen docusate sodium (COLACE) 100 MG capsule Take 1 capsule (100 mg total) by mouth 2 (two) times daily.  . folic acid (FOLVITE) 1 MG tablet Take 1 mg by mouth daily.  Marland Kitchen ipratropium-albuterol (DUONEB)  0.5-2.5 (3) MG/3ML SOLN Take 3 mLs by nebulization every 6 (six) hours as needed.  . isosorbide mononitrate (IMDUR) 30 MG 24 hr tablet Take 30 mg by mouth daily.   . Lidocaine 4 % PTCH Apply 1 patch topically daily. Left Ribs- Remove patch after 12 hours  . losartan (COZAAR) 100 MG tablet Take 100 mg by  mouth daily.   . methotrexate (RHEUMATREX) 2.5 MG tablet Take 6 tablets by mouth every Friday.   . metoprolol (LOPRESSOR) 100 MG tablet Take 100 mg by mouth 2 (two) times daily.   . mirtazapine (REMERON) 7.5 MG tablet Take 7.5 mg by mouth at bedtime.  . Multiple Vitamin (MULTI-VITAMINS) TABS Take 1 tablet by mouth daily.  . NON FORMULARY Diet: Regular  . potassium chloride (MICRO-K) 10 MEQ CR capsule Take 10 mEq by mouth daily.  . sertraline (ZOLOFT) 100 MG tablet Take 100 mg by mouth daily.  . [DISCONTINUED] traMADol (ULTRAM) 50 MG tablet Take 1 tablet (50 mg total) by mouth every 4 (four) hours as needed.  . [DISCONTINUED] traMADol (ULTRAM) 50 MG tablet Take 1 tablet (50 mg total) by mouth 3 (three) times daily. for pain- ok to hold for sedation   No facility-administered encounter medications on file as of 09/13/2017.      SIGNIFICANT DIAGNOSTIC EXAMS  LABS REVIEWED: TODAY:   06-05-17: wbc  8.4; hgb 11.4 hct 34.8; plt 112.8; plt 241; glucose 86; bun 26; creat 0.98; k+ 3.4; na++ 138; ca 8.6 liver normal albumin 3.4; mag 2.0; tsh 1.753; vit B 12: 321; vit D 27.0  Review of Systems  Constitutional: Negative for malaise/fatigue.  Respiratory: Negative for cough and shortness of breath.   Cardiovascular: Negative for chest pain, palpitations and leg swelling.  Gastrointestinal: Negative for abdominal pain, constipation and heartburn.  Musculoskeletal: Negative for back pain, joint pain and myalgias.  Skin: Negative.   Neurological: Negative for dizziness.  Psychiatric/Behavioral: The patient is not nervous/anxious.     Physical Exam  Constitutional: She appears well-developed and  well-nourished. No distress.  Neck: No thyromegaly present.  Cardiovascular: Normal rate, regular rhythm, normal heart sounds and intact distal pulses.  Pulmonary/Chest: Effort normal and breath sounds normal. No respiratory distress.  Status post bilateral mastectomies with implants   Abdominal: Soft. Bowel sounds are normal. She exhibits no distension. There is no tenderness.  Musculoskeletal: She exhibits no edema.  Is able to move all extremities  Uses wheelchair  Lymphadenopathy:    She has no cervical adenopathy.  Neurological: She is alert.  Skin: Skin is warm and dry. She is not diaphoretic.  Right foot wound: 4.5 x 3.5 cm  Psychiatric: She has a normal mood and affect.     ASSESSMENT/ PLAN:  TODAY:   1. Essential hypertension, benign: has pulmonary htn: is stable b/p 137/66: will continue norvasc 10 mgm daily cozaar 100 mg daily lopressor 100 mg twice daily asa 81 mg daily imdur 30 mg daily   2. Rheumatoid arthritis without elevated rheumatoid factor: is stable will continue methotrexate 15 mg weekly; folic acid 1 mg daily; tylenol 1300 mg twice daily   3. Rib pain on the left side: is stable will continue lidoderm patch for 12 hours daily   4. Lumbar stenosis with neurogenic claudication: is stable will continue tylenol 1300 mg twice daily   5. Hypokalemia: is stable will continue k+ 10 meq daily   6. Depression, major single episode, mild: is stable will continue zoloft 100 mg daily and remeron 7.5 mg nightly   7. Chronic constipation: is stable will continue colace twice daily     MD is aware of resident's narcotic use and is in agreement with current plan of care. We will attempt to wean resident as apropriate   Ok Edwards NP [facility]  Contact [phone] Monday through Friday 8am- 5pm  After hours call [phone]

## 2017-09-20 DIAGNOSIS — M48062 Spinal stenosis, lumbar region with neurogenic claudication: Secondary | ICD-10-CM | POA: Diagnosis not present

## 2017-09-23 DIAGNOSIS — F320 Major depressive disorder, single episode, mild: Secondary | ICD-10-CM | POA: Insufficient documentation

## 2017-09-23 DIAGNOSIS — K5909 Other constipation: Secondary | ICD-10-CM | POA: Insufficient documentation

## 2017-09-23 DIAGNOSIS — E876 Hypokalemia: Secondary | ICD-10-CM | POA: Insufficient documentation

## 2017-09-23 DIAGNOSIS — I10 Essential (primary) hypertension: Secondary | ICD-10-CM | POA: Insufficient documentation

## 2017-09-27 ENCOUNTER — Encounter
Admission: RE | Admit: 2017-09-27 | Discharge: 2017-09-27 | Disposition: A | Discharge status: Home / Self Care | Payer: PPO | Source: Ambulatory Visit | Attending: Internal Medicine | Admitting: Internal Medicine

## 2017-09-27 DIAGNOSIS — M810 Age-related osteoporosis without current pathological fracture: Secondary | ICD-10-CM | POA: Diagnosis not present

## 2017-09-27 DIAGNOSIS — I10 Essential (primary) hypertension: Secondary | ICD-10-CM | POA: Insufficient documentation

## 2017-09-27 DIAGNOSIS — R262 Difficulty in walking, not elsewhere classified: Secondary | ICD-10-CM | POA: Diagnosis not present

## 2017-09-27 DIAGNOSIS — Z9181 History of falling: Secondary | ICD-10-CM | POA: Diagnosis not present

## 2017-09-27 DIAGNOSIS — M069 Rheumatoid arthritis, unspecified: Secondary | ICD-10-CM | POA: Diagnosis not present

## 2017-09-27 DIAGNOSIS — F0391 Unspecified dementia with behavioral disturbance: Secondary | ICD-10-CM | POA: Diagnosis not present

## 2017-09-27 DIAGNOSIS — M6281 Muscle weakness (generalized): Secondary | ICD-10-CM | POA: Diagnosis not present

## 2017-09-27 DIAGNOSIS — M48062 Spinal stenosis, lumbar region with neurogenic claudication: Secondary | ICD-10-CM | POA: Diagnosis not present

## 2017-10-24 ENCOUNTER — Non-Acute Institutional Stay (SKILLED_NURSING_FACILITY): Payer: PPO | Admitting: Adult Health

## 2017-10-24 ENCOUNTER — Encounter: Payer: Self-pay | Admitting: Adult Health

## 2017-10-24 DIAGNOSIS — E876 Hypokalemia: Secondary | ICD-10-CM

## 2017-10-24 DIAGNOSIS — F320 Major depressive disorder, single episode, mild: Secondary | ICD-10-CM

## 2017-10-24 DIAGNOSIS — M48062 Spinal stenosis, lumbar region with neurogenic claudication: Secondary | ICD-10-CM

## 2017-10-24 DIAGNOSIS — K5909 Other constipation: Secondary | ICD-10-CM

## 2017-10-24 NOTE — Progress Notes (Addendum)
Location:   [facility] Room Number: 852D Place of Service:  SNF (31)   CODE STATUS: DNR  Allergies  Allergen Reactions  . Atorvastatin Other (See Comments)    Myositis  . Bisphosphonates Other (See Comments)    GI upset  . Codeine Other (See Comments)  . Memantine Other (See Comments)  . Nsaids Other (See Comments)    GI upset  . Omeprazole Nausea Only  . Other Other (See Comments)    Leg cramps GI upset  . Salsalate Other (See Comments)  . Sulfa Antibiotics     Pt doesn't remember  . Tamoxifen Other (See Comments)    Leg cramps  . Penicillins Itching    Has patient had a PCN reaction causing immediate rash, facial/tongue/throat swelling, SOB or lightheadedness with hypotension: Yes Has patient had a PCN reaction causing severe rash involving mucus membranes or skin necrosis: No Has patient had a PCN reaction that required hospitalization: No Has patient had a PCN reaction occurring within the last 10 years: No If all of the above answers are "NO", then may proceed with Cephalosporin use.    Chief Complaint  Patient presents with  . Medical Management of Chronic Issues    Lumbar stenosis; hypokalemia; depression; constipation     HPI:  She is a 84 year old long term resident of this facility being seen for the management of her chronic illnesses: lumbar stenosis; hypokalemia; depression; constipation. She is followed by palliative care. She denies any uncontrolled back pain; no complaints of constipation or anxiety. There are no nursing concerns at this time.   Past Medical History:  Diagnosis Date  . breast cancer   . DVT (deep venous thrombosis) ([place]) 06/2005   RLE ; treated x 3 months with Coumadin  . Fibrocystic breast disease    Severe; status post bilateral mastectomies and implants  . GERD (gastroesophageal reflux disease)    Severe; manifested vocal cord irritation, status-post Nissen fundcoplication in 08/8240  . History of  ataxia    with questionable right upper and lower  . History of breast cancer   . History of exertional chest pain    with her last stress echo in 09/1999 being normal  . History of migraine    vascular  . History of shingles   . History of syncope 12/1999   With negative cardiac workup to include an echocardiogram, which revealed only mild to moderate MR, a CT scan of the chest, which was negative, and a Cardiolite stress test, which was normal  . Hyperlipidemia, unspecified   . Hypertension    CTA 05/2004 showed no renovascular cause  . Mild dementia   . Muscle atrophy    Positive FANA and anti- SCL70 antibodies; S/p biopsy without eosinic fascitis; Remicade discontinued  . Osteoarthritis    a. Cervical spine. b. Lumbar spine/spinal stenosis. [facility])  . Osteoporosis    a. Fosamax b. Right wrist fracture. c. Sternal fracture ([place])  . Rheumatoid arthritis without elevated rheumatoid factor (HCC)    a. Methotrexate. b. S/p Remicade. c. Seronegative. d. S/p Plaquenil. [facility])    Past Surgical History:  Procedure Laterality Date  . APPENDECTOMY    . CHOLECYSTECTOMY    . IRRIGATION AND DEBRIDEMENT ABSCESS Right 04/08/2017   Procedure: IRRIGATION AND DEBRIDEMENT ABSCESS with wound vac;  Surgeon: Albertine Patricia, DPM;  Location: [facility] ORS;  Service: Podiatry;  Laterality: Right;  . LAPAROSCOPIC NISSEN FUNDOPLICATION  35/3614  . LUMBAR LAMINECTOMY  for spinal stenosis  . MASTECTOMY Bilateral    with implants  . ORIF DISTAL RADIUS FRACTURE Right 07/27/2008   with volar plate  . TOTAL ABDOMINAL HYSTERECTOMY W/ BILATERAL SALPINGOOPHORECTOMY    . VASCULAR SURGERY      Social History   Socioeconomic History  . Marital status: Widowed    Spouse name: Not on file  . Number of children: 0  . Years of education: 2  . Highest education level: High school graduate  Occupational History  . Not on file  Social Needs  . Financial resource strain: Not on file  . Food insecurity:     Worry: Not on file    Inability: Not on file  . Transportation needs:    Medical: Not on file    Non-medical: Not on file  Tobacco Use  . Smoking status: Never Smoker  . Smokeless tobacco: Never Used  Substance and Sexual Activity  . Alcohol use: No  . Drug use: No  . Sexual activity: Not Currently  Lifestyle  . Physical activity:    Days per week: Not on file    Minutes per session: Not on file  . Stress: Not on file  Relationships  . Social connections:    Talks on phone: Not on file    Gets together: Not on file    Attends religious service: Not on file    Active member of club or organization: Not on file    Attends meetings of clubs or organizations: Not on file    Relationship status: Not on file  . Intimate partner violence:    Fear of current or ex partner: Not on file    Emotionally abused: Not on file    Physically abused: Not on file    Forced sexual activity: Not on file  Other Topics Concern  . Not on file  Social History Narrative  . Not on file   Family History  Problem Relation Age of Onset  . Heart failure Mother   . Hypertension Mother   . Cancer Father   . Heart attack Sister       VITAL SIGNS BP 122/86   Pulse 60   Temp (!) 97.1 F (36.2 C) (Oral)   Resp 16   Ht 5\' 3"  (1.6 m)   Wt 141 lb (64 kg)   SpO2 96%   BMI 24.98 kg/m   Outpatient Encounter Medications as of 10/24/2017  Medication Sig  . acetaminophen (TYLENOL) 650 MG CR tablet Take 1,300 mg by mouth 2 (two) times daily.   Marland Kitchen amLODipine (NORVASC) 10 MG tablet Take 10 mg by mouth daily.  . Ascorbic Acid (VITAMIN C) 1000 MG tablet Take 1,000 mg by mouth daily.  Marland Kitchen aspirin EC 81 MG tablet Take 81 mg by mouth daily.   . Cholecalciferol (VITAMIN D) 2000 units tablet Take 2,000 Units by mouth daily.  . cyanocobalamin 1000 MCG tablet Take 1,000 mcg by mouth daily.  Marland Kitchen docusate sodium (COLACE) 100 MG capsule Take 1 capsule (100 mg total) by mouth 2 (two) times daily.  . folic acid  (FOLVITE) 1 MG tablet Take 1 mg by mouth daily.  Marland Kitchen ipratropium-albuterol (DUONEB) 0.5-2.5 (3) MG/3ML SOLN Take 3 mLs by nebulization every 6 (six) hours as needed.  . isosorbide mononitrate (IMDUR) 30 MG 24 hr tablet Take 30 mg by mouth daily.   . Lidocaine 4 % PTCH Apply 1 patch topically daily. Left Ribs- Remove patch after 12 hours  . losartan (COZAAR)  100 MG tablet Take 100 mg by mouth daily.   . methotrexate (RHEUMATREX) 2.5 MG tablet Take 6 tablets by mouth every Friday.   . metoprolol (LOPRESSOR) 100 MG tablet Take 100 mg by mouth 2 (two) times daily.   . mirtazapine (REMERON) 7.5 MG tablet Take 7.5 mg by mouth at bedtime.  . Multiple Vitamin (MULTI-VITAMINS) TABS Take 1 tablet by mouth daily.  . NON FORMULARY Diet: Regular  . potassium chloride (MICRO-K) 10 MEQ CR capsule Take 10 mEq by mouth daily.  . sertraline (ZOLOFT) 100 MG tablet Take 100 mg by mouth daily.   No facility-administered encounter medications on file as of 10/24/2017.      SIGNIFICANT DIAGNOSTIC EXAMS  LABS REVIEWED: PREVIOUS:   06-05-17: wbc  8.4; hgb 11.4 hct 34.8; plt 112.8; plt 241; glucose 86; bun 26; creat 0.98; k+ 3.4; na++ 138; ca 8.6 liver normal albumin 3.4; mag 2.0; tsh 1.753; vit B 12: 321; vit D 27.0  NO NEW LABS.    Review of Systems  Constitutional: Negative for malaise/fatigue.  Respiratory: Negative for cough and shortness of breath.   Cardiovascular: Negative for chest pain, palpitations and leg swelling.  Gastrointestinal: Negative for abdominal pain, constipation and heartburn.  Musculoskeletal: Negative for back pain, joint pain and myalgias.  Skin: Negative.   Neurological: Negative for dizziness.  Psychiatric/Behavioral: The patient is not nervous/anxious.     Physical Exam  Constitutional: She appears well-developed and well-nourished. No distress.  Neck: No thyromegaly present.  Cardiovascular: Normal rate, regular rhythm, normal heart sounds and intact distal pulses.    Pulmonary/Chest: Effort normal and breath sounds normal. No respiratory distress.  Status post bilateral mastectomies with implants   Abdominal: Soft. Bowel sounds are normal. She exhibits no distension. There is no tenderness.  Musculoskeletal: She exhibits no edema.  Is able to move all extremities  Uses wheelchair   Lymphadenopathy:    She has no cervical adenopathy.  Neurological: She is alert.  Skin: Skin is warm and dry. She is not diaphoretic.  Right foot without signs of infection present.   Psychiatric: She has a normal mood and affect.     ASSESSMENT/ PLAN:  TODAY:   1. Lumbar stenosis with neurogenic claudication: is stable will continue tylenol 1300 mg twice daily   2. Hypokalemia: is stable will continue k+ 10 meq daily   3. Depression, major single episode, mild: is stable will continue zoloft 100 mg daily and remeron 7.5 mg nightly   4. Chronic constipation: is stable will continue colace twice daily   PREVIOUS   5. Essential hypertension, benign: has pulmonary htn: is stable b/p 128/86: will continue norvasc 10 mg daily cozaar 100 mg daily lopressor 100 mg twice daily asa 81 mg daily imdur 30 mg daily   6. Rheumatoid arthritis without elevated rheumatoid factor: is stable will continue methotrexate 15 mg weekly; folic acid 1 mg daily; tylenol 1300 mg twice daily   7. Rib pain on the left side: is stable will continue lidoderm patch for 12 hours daily   Health maintenance: will setup a dexa scan will order prevnar   MD is aware of resident's narcotic use and is in agreement with current plan of care. We will attempt to wean resident as apropriate   Ok Edwards NP [facility]  Contact [phone] Monday through Friday 8am- 5pm  After hours call [phone]

## 2017-10-24 NOTE — Progress Notes (Signed)
Opened in error; Disregard.

## 2017-10-26 DIAGNOSIS — M0600 Rheumatoid arthritis without rheumatoid factor, unspecified site: Secondary | ICD-10-CM | POA: Diagnosis not present

## 2017-10-26 DIAGNOSIS — I272 Pulmonary hypertension, unspecified: Secondary | ICD-10-CM | POA: Diagnosis not present

## 2017-10-26 DIAGNOSIS — Z789 Other specified health status: Secondary | ICD-10-CM | POA: Diagnosis not present

## 2017-10-26 DIAGNOSIS — M810 Age-related osteoporosis without current pathological fracture: Secondary | ICD-10-CM | POA: Diagnosis not present

## 2017-10-26 DIAGNOSIS — R413 Other amnesia: Secondary | ICD-10-CM | POA: Diagnosis not present

## 2017-10-28 ENCOUNTER — Encounter
Admission: RE | Admit: 2017-10-28 | Discharge: 2017-10-28 | Disposition: A | Discharge status: Home / Self Care | Payer: PPO | Source: Ambulatory Visit | Attending: Internal Medicine | Admitting: Internal Medicine

## 2017-10-28 DIAGNOSIS — I10 Essential (primary) hypertension: Secondary | ICD-10-CM | POA: Insufficient documentation

## 2017-11-21 ENCOUNTER — Encounter: Payer: Self-pay | Admitting: Adult Health

## 2017-11-21 ENCOUNTER — Non-Acute Institutional Stay (SKILLED_NURSING_FACILITY): Payer: PPO | Admitting: Adult Health

## 2017-11-21 DIAGNOSIS — R0781 Pleurodynia: Secondary | ICD-10-CM

## 2017-11-21 DIAGNOSIS — I10 Essential (primary) hypertension: Secondary | ICD-10-CM

## 2017-11-21 DIAGNOSIS — M0600 Rheumatoid arthritis without rheumatoid factor, unspecified site: Secondary | ICD-10-CM

## 2017-11-21 DIAGNOSIS — I272 Pulmonary hypertension, unspecified: Secondary | ICD-10-CM | POA: Diagnosis not present

## 2017-11-21 NOTE — Progress Notes (Signed)
[facility]:   [facility] Room Number: [place] of Service:  SNF (31)   CODE STATUS: DNR  Allergies  Allergen Reactions  . Atorvastatin Other (See Comments)    Myositis  . Bisphosphonates Other (See Comments)    GI upset  . Codeine Other (See Comments)  . Memantine Other (See Comments)  . Nsaids Other (See Comments)    GI upset  . Omeprazole Nausea Only  . Other Other (See Comments)    Leg cramps GI upset  . Salsalate Other (See Comments)  . Sulfa Antibiotics     Pt doesn't remember  . Tamoxifen Other (See Comments)    Leg cramps  . Penicillins Itching    Has patient had a PCN reaction causing immediate rash, facial/tongue/throat swelling, SOB or lightheadedness with hypotension: Yes Has patient had a PCN reaction causing severe rash involving mucus membranes or skin necrosis: No Has patient had a PCN reaction that required hospitalization: No Has patient had a PCN reaction occurring within the last 10 years: No If all of the above answers are "NO", then may proceed with Cephalosporin use.    Chief Complaint  Patient presents with  . Medical Management of Chronic Issues    Hypertension; RA; left rib cage pain.     HPI:  She is a 84 year old long term resident of this facility being seen for the management of her chronic illnesses: hypertension; RA; left rib cage pain. She denies any headaches; no joint pain; no changes in appetite.   Past Medical History:  Diagnosis Date  . breast cancer   . DVT (deep venous thrombosis) ([place]) 06/2005   RLE ; treated x 3 months with Coumadin  . Fibrocystic breast disease    Severe; status post bilateral mastectomies and implants  . GERD (gastroesophageal reflux disease)    Severe; manifested vocal cord irritation, status-post Nissen fundcoplication in 09/1827  . History of ataxia    with questionable right upper and lower  . History of breast cancer   . History of exertional chest pain    with her  last stress echo in 09/1999 being normal  . History of migraine    vascular  . History of shingles   . History of syncope 12/1999   With negative cardiac workup to include an echocardiogram, which revealed only mild to moderate MR, a CT scan of the chest, which was negative, and a Cardiolite stress test, which was normal  . Hyperlipidemia, unspecified   . Hypertension    CTA 05/2004 showed no renovascular cause  . Mild dementia   . Muscle atrophy    Positive FANA and anti- SCL70 antibodies; S/p biopsy without eosinic fascitis; Remicade discontinued  . Osteoarthritis    a. Cervical spine. b. Lumbar spine/spinal stenosis. [facility])  . Osteoporosis    a. Fosamax b. Right wrist fracture. c. Sternal fracture ([place])  . Rheumatoid arthritis without elevated rheumatoid factor (HCC)    a. Methotrexate. b. S/p Remicade. c. Seronegative. d. S/p Plaquenil. [facility])    Past Surgical History:  Procedure Laterality Date  . APPENDECTOMY    . CHOLECYSTECTOMY    . IRRIGATION AND DEBRIDEMENT ABSCESS Right 04/08/2017   Procedure: IRRIGATION AND DEBRIDEMENT ABSCESS with wound vac;  Surgeon: Albertine Patricia, DPM;  Location: [facility] ORS;  Service: Podiatry;  Laterality: Right;  . LAPAROSCOPIC NISSEN FUNDOPLICATION  93/7169  . LUMBAR LAMINECTOMY     for spinal stenosis  . MASTECTOMY Bilateral    with  implants  . ORIF DISTAL RADIUS FRACTURE Right 07/27/2008   with volar plate  . TOTAL ABDOMINAL HYSTERECTOMY W/ BILATERAL SALPINGOOPHORECTOMY    . VASCULAR SURGERY      Social History   Socioeconomic History  . Marital status: Widowed    Spouse name: Not on file  . Number of children: 0  . Years of education: 73  . Highest education level: High school graduate  Occupational History  . Not on file  Social Needs  . Financial resource strain: Not on file  . Food insecurity:    Worry: Not on file    Inability: Not on file  . Transportation needs:    Medical: Not on file    Non-medical: Not on file  Tobacco  Use  . Smoking status: Never Smoker  . Smokeless tobacco: Never Used  Substance and Sexual Activity  . Alcohol use: No  . Drug use: No  . Sexual activity: Not Currently  Lifestyle  . Physical activity:    Days per week: Not on file    Minutes per session: Not on file  . Stress: Not on file  Relationships  . Social connections:    Talks on phone: Not on file    Gets together: Not on file    Attends religious service: Not on file    Active member of club or organization: Not on file    Attends meetings of clubs or organizations: Not on file    Relationship status: Not on file  . Intimate partner violence:    Fear of current or ex partner: Not on file    Emotionally abused: Not on file    Physically abused: Not on file    Forced sexual activity: Not on file  Other Topics Concern  . Not on file  Social History Narrative  . Not on file   Family History  Problem Relation Age of Onset  . Heart failure Mother   . Hypertension Mother   . Cancer Father   . Heart attack Sister       VITAL SIGNS BP 112/70   Pulse 78   Temp (!) 97.4 F (36.3 C)   Resp 16   Ht 5\' 3"  (1.6 m)   Wt 142 lb (64.4 kg)   SpO2 96%   BMI 25.15 kg/m   Outpatient Encounter Medications as of 11/21/2017  Medication Sig  . acetaminophen (TYLENOL) 650 MG CR tablet Take 1,300 mg by mouth 2 (two) times daily.   Marland Kitchen amLODipine (NORVASC) 10 MG tablet Take 10 mg by mouth daily.  . Ascorbic Acid (VITAMIN C) 1000 MG tablet Take 1,000 mg by mouth daily.  Marland Kitchen aspirin EC 81 MG tablet Take 81 mg by mouth daily.   . Cholecalciferol (VITAMIN D) 2000 units tablet Take 2,000 Units by mouth daily.  . cyanocobalamin 1000 MCG tablet Take 1,000 mcg by mouth daily.  Marland Kitchen docusate sodium (COLACE) 100 MG capsule Take 1 capsule (100 mg total) by mouth 2 (two) times daily.  . folic acid (FOLVITE) 1 MG tablet Take 1 mg by mouth daily.  Marland Kitchen ipratropium-albuterol (DUONEB) 0.5-2.5 (3) MG/3ML SOLN Take 3 mLs by nebulization every 6 (six)  hours as needed.  . isosorbide mononitrate (IMDUR) 30 MG 24 hr tablet Take 30 mg by mouth daily.   . Lidocaine 4 % PTCH Apply 1 patch topically daily. Left Ribs- Remove patch after 12 hours  . losartan (COZAAR) 100 MG tablet Take 100 mg by mouth daily.   Marland Kitchen  methotrexate (RHEUMATREX) 2.5 MG tablet Take 6 tablets by mouth every Friday.   . metoprolol (LOPRESSOR) 100 MG tablet Take 100 mg by mouth 2 (two) times daily.   . mirtazapine (REMERON) 7.5 MG tablet Take 7.5 mg by mouth at bedtime.  . Multiple Vitamin (MULTI-VITAMINS) TABS Take 1 tablet by mouth daily.  . NON FORMULARY Diet: Regular  . potassium chloride (MICRO-K) 10 MEQ CR capsule Take 10 mEq by mouth daily.  . sertraline (ZOLOFT) 100 MG tablet Take 100 mg by mouth daily.   No facility-administered encounter medications on file as of 11/21/2017.      SIGNIFICANT DIAGNOSTIC EXAMS  LABS REVIEWED: PREVIOUS:   06-05-17: wbc  8.4; hgb 11.4 hct 34.8; plt 112.8; plt 241; glucose 86; bun 26; creat 0.98; k+ 3.4; na++ 138; ca 8.6 liver normal albumin 3.4; mag 2.0; tsh 1.753; vit B 12: 321; vit D 27.0  NO NEW LABS.    Review of Systems  Constitutional: Negative for malaise/fatigue.  Respiratory: Negative for cough and shortness of breath.   Cardiovascular: Negative for chest pain, palpitations and leg swelling.  Gastrointestinal: Negative for abdominal pain, constipation and heartburn.  Musculoskeletal: Negative for back pain, joint pain and myalgias.  Skin: Negative.   Neurological: Negative for dizziness.  Psychiatric/Behavioral: The patient is not nervous/anxious.      Physical Exam  Constitutional: She appears well-developed and well-nourished. No distress.  Neck: No thyromegaly present.  Cardiovascular: Normal rate, regular rhythm, normal heart sounds and intact distal pulses.  Pulmonary/Chest: Effort normal and breath sounds normal. No respiratory distress.  Is status post bilateral mastectomies with implants   Abdominal: Soft.  Bowel sounds are normal. She exhibits no distension. There is no tenderness.  Musculoskeletal: She exhibits no edema.  Is able to move all extremities   Lymphadenopathy:    She has no cervical adenopathy.  Neurological: She is alert.  Skin: Skin is warm and dry. She is not diaphoretic.  Psychiatric: She has a normal mood and affect.    ASSESSMENT/ PLAN:  TODAY:   1. Essential hypertension, benign: has pulmonary htn: is stable b/p 128/86: will continue norvasc 10 mg daily cozaar 100 mg daily lopressor 100 mg twice daily asa 81 mg daily imdur 30 mg daily   2. Rheumatoid arthritis without elevated rheumatoid factor: is stable will continue methotrexate 15 mg weekly; folic acid 1 mg daily; tylenol 1300 mg twice daily   3. Rib pain on the left side: is stable will continue lidoderm patch for 12 hours daily   PREVIOUS   4. Lumbar stenosis with neurogenic claudication: is stable will continue tylenol 1300 mg twice daily   5. Hypokalemia: is stable will continue k+ 10 meq daily   6. Depression, major single episode, mild: is stable will continue zoloft 100 mg daily and remeron 7.5 mg nightly   7. Chronic constipation: is stable will continue colace twice daily      MD is aware of resident's narcotic use and is in agreement with current plan of care. We will attempt to wean resident as apropriate   Ok Edwards NP [facility]  Contact [phone] Monday through Friday 8am- 5pm  After hours call [phone]

## 2017-11-27 ENCOUNTER — Encounter
Admission: RE | Admit: 2017-11-27 | Discharge: 2017-11-27 | Disposition: A | Discharge status: Home / Self Care | Payer: PPO | Source: Ambulatory Visit | Attending: Internal Medicine | Admitting: Internal Medicine

## 2017-11-27 DIAGNOSIS — I10 Essential (primary) hypertension: Secondary | ICD-10-CM | POA: Insufficient documentation

## 2017-12-19 ENCOUNTER — Other Ambulatory Visit: Payer: Self-pay | Admitting: Adult Health

## 2017-12-28 ENCOUNTER — Encounter
Admission: RE | Admit: 2017-12-28 | Discharge: 2017-12-28 | Disposition: A | Discharge status: Home / Self Care | Payer: PPO | Source: Ambulatory Visit | Attending: Internal Medicine | Admitting: Internal Medicine

## 2017-12-28 DIAGNOSIS — I10 Essential (primary) hypertension: Secondary | ICD-10-CM | POA: Insufficient documentation

## 2017-12-31 ENCOUNTER — Non-Acute Institutional Stay (SKILLED_NURSING_FACILITY): Payer: PPO | Admitting: Adult Health

## 2017-12-31 ENCOUNTER — Encounter: Payer: Self-pay | Admitting: Adult Health

## 2017-12-31 DIAGNOSIS — M48062 Spinal stenosis, lumbar region with neurogenic claudication: Secondary | ICD-10-CM | POA: Diagnosis not present

## 2017-12-31 DIAGNOSIS — F320 Major depressive disorder, single episode, mild: Secondary | ICD-10-CM | POA: Diagnosis not present

## 2017-12-31 DIAGNOSIS — K5909 Other constipation: Secondary | ICD-10-CM

## 2017-12-31 DIAGNOSIS — E876 Hypokalemia: Secondary | ICD-10-CM

## 2017-12-31 NOTE — Progress Notes (Signed)
Location:   [facility] Room Number: [place] of Service:  SNF (31)   CODE STATUS: DNR  Allergies  Allergen Reactions  . Atorvastatin Other (See Comments)    Myositis  . Bisphosphonates Other (See Comments)    GI upset  . Codeine Other (See Comments)  . Memantine Other (See Comments)  . Nsaids Other (See Comments)    GI upset  . Omeprazole Nausea Only  . Other Other (See Comments)    Leg cramps GI upset  . Salsalate Other (See Comments)  . Sulfa Antibiotics     Pt doesn't remember  . Tamoxifen Other (See Comments)    Leg cramps  . Penicillins Itching    Has patient had a PCN reaction causing immediate rash, facial/tongue/throat swelling, SOB or lightheadedness with hypotension: Yes Has patient had a PCN reaction causing severe rash involving mucus membranes or skin necrosis: No Has patient had a PCN reaction that required hospitalization: No Has patient had a PCN reaction occurring within the last 10 years: No If all of the above answers are "NO", then may proceed with Cephalosporin use.    Chief Complaint  Patient presents with  . Medical Management of Chronic Issues    Lumbar stenosis with neurogenic claudication; hypokalemia; depression major single episode mild; chronic constipation.     HPI:  She is a 84 year old long term resident of this facility being seen for the management of her chronic illnesses: lumbar stenosis; hypokalemia; depression; constipation. She denies any problems with constipation; no changes in appetite; no anxiety and no depressive thought.    Past Medical History:  Diagnosis Date  . breast cancer   . DVT (deep venous thrombosis) ([place]) 06/2005   RLE ; treated x 3 months with Coumadin  . Fibrocystic breast disease    Severe; status post bilateral mastectomies and implants  . GERD (gastroesophageal reflux disease)    Severe; manifested vocal cord irritation, status-post Nissen fundcoplication in 06/6810  .  History of ataxia    with questionable right upper and lower  . History of breast cancer   . History of exertional chest pain    with her last stress echo in 09/1999 being normal  . History of migraine    vascular  . History of shingles   . History of syncope 12/1999   With negative cardiac workup to include an echocardiogram, which revealed only mild to moderate MR, a CT scan of the chest, which was negative, and a Cardiolite stress test, which was normal  . Hyperlipidemia, unspecified   . Hypertension    CTA 05/2004 showed no renovascular cause  . Mild dementia ([place])   . Muscle atrophy    Positive FANA and anti- SCL70 antibodies; S/p biopsy without eosinic fascitis; Remicade discontinued  . Osteoarthritis    a. Cervical spine. b. Lumbar spine/spinal stenosis. [facility])  . Osteoporosis    a. Fosamax b. Right wrist fracture. c. Sternal fracture ([place])  . Rheumatoid arthritis without elevated rheumatoid factor (HCC)    a. Methotrexate. b. S/p Remicade. c. Seronegative. d. S/p Plaquenil. [facility])    Past Surgical History:  Procedure Laterality Date  . APPENDECTOMY    . CHOLECYSTECTOMY    . IRRIGATION AND DEBRIDEMENT ABSCESS Right 04/08/2017   Procedure: IRRIGATION AND DEBRIDEMENT ABSCESS with wound vac;  Surgeon: Albertine Patricia, DPM;  Location: [facility] ORS;  Service: Podiatry;  Laterality: Right;  . LAPAROSCOPIC NISSEN FUNDOPLICATION  75/1700  . LUMBAR LAMINECTOMY  for spinal stenosis  . MASTECTOMY Bilateral    with implants  . ORIF DISTAL RADIUS FRACTURE Right 07/27/2008   with volar plate  . TOTAL ABDOMINAL HYSTERECTOMY W/ BILATERAL SALPINGOOPHORECTOMY    . VASCULAR SURGERY      Social History   Socioeconomic History  . Marital status: Widowed    Spouse name: Not on file  . Number of children: 0  . Years of education: 73  . Highest education level: High school graduate  Occupational History  . Not on file  Social Needs  . Financial resource strain: Not on file  . Food  insecurity:    Worry: Not on file    Inability: Not on file  . Transportation needs:    Medical: Not on file    Non-medical: Not on file  Tobacco Use  . Smoking status: Never Smoker  . Smokeless tobacco: Never Used  Substance and Sexual Activity  . Alcohol use: No  . Drug use: No  . Sexual activity: Not Currently  Lifestyle  . Physical activity:    Days per week: Not on file    Minutes per session: Not on file  . Stress: Not on file  Relationships  . Social connections:    Talks on phone: Not on file    Gets together: Not on file    Attends religious service: Not on file    Active member of club or organization: Not on file    Attends meetings of clubs or organizations: Not on file    Relationship status: Not on file  . Intimate partner violence:    Fear of current or ex partner: Not on file    Emotionally abused: Not on file    Physically abused: Not on file    Forced sexual activity: Not on file  Other Topics Concern  . Not on file  Social History Narrative  . Not on file   Family History  Problem Relation Age of Onset  . Heart failure Mother   . Hypertension Mother   . Cancer Father   . Heart attack Sister       VITAL SIGNS BP (!) 154/39   Pulse (!) 53   Temp 98.1 F (36.7 C)   Resp 16   Ht 5\' 3"  (1.6 m)   Wt 148 lb 12.8 oz (67.5 kg)   SpO2 94%   BMI 26.36 kg/m   Outpatient Encounter Medications as of 12/31/2017  Medication Sig  . acetaminophen (TYLENOL) 650 MG CR tablet Take 1,300 mg by mouth 2 (two) times daily.   Marland Kitchen amLODipine (NORVASC) 10 MG tablet Take 10 mg by mouth daily.  . Ascorbic Acid (VITAMIN C) 1000 MG tablet Take 1,000 mg by mouth daily.  Marland Kitchen aspirin EC 81 MG tablet Take 81 mg by mouth daily.   . Cholecalciferol (VITAMIN D) 2000 units tablet Take 2,000 Units by mouth daily.  . cyanocobalamin 1000 MCG tablet Take 1,000 mcg by mouth daily.  Marland Kitchen docusate sodium (COLACE) 100 MG capsule Take 1 capsule (100 mg total) by mouth 2 (two) times daily.   . folic acid (FOLVITE) 1 MG tablet Take 1 mg by mouth daily.  Marland Kitchen ipratropium-albuterol (DUONEB) 0.5-2.5 (3) MG/3ML SOLN Take 3 mLs by nebulization every 6 (six) hours as needed.  . isosorbide mononitrate (IMDUR) 30 MG 24 hr tablet Take 30 mg by mouth daily.   . Lidocaine 4 % PTCH Apply 1 patch topically daily. Left Ribs- Remove patch after 12 hours  .  losartan (COZAAR) 100 MG tablet Take 100 mg by mouth daily.   . methotrexate (RHEUMATREX) 2.5 MG tablet Take 6 tablets by mouth every Friday.   . metoprolol (LOPRESSOR) 100 MG tablet Take 100 mg by mouth 2 (two) times daily.   . Multiple Vitamin (MULTI-VITAMINS) TABS Take 1 tablet by mouth daily.  . NON FORMULARY Diet: Regular  . potassium chloride (MICRO-K) 10 MEQ CR capsule Take 10 mEq by mouth daily.  . sertraline (ZOLOFT) 100 MG tablet Take 100 mg by mouth daily.  . [DISCONTINUED] mirtazapine (REMERON) 7.5 MG tablet Take 7.5 mg by mouth at bedtime.   No facility-administered encounter medications on file as of 12/31/2017.      SIGNIFICANT DIAGNOSTIC EXAMS  LABS REVIEWED: PREVIOUS:   06-05-17: wbc  8.4; hgb 11.4 hct 34.8; plt 112.8; plt 241; glucose 86; bun 26; creat 0.98; k+ 3.4; na++ 138; ca 8.6 liver normal albumin 3.4; mag 2.0; tsh 1.753; vit B 12: 321; vit D 27.0  NO NEW LABS.   Review of Systems  Constitutional: Negative for malaise/fatigue.  Respiratory: Negative for cough and shortness of breath.   Cardiovascular: Negative for chest pain, palpitations and leg swelling.  Gastrointestinal: Negative for abdominal pain, constipation and heartburn.  Musculoskeletal: Negative for back pain, joint pain and myalgias.  Skin: Negative.   Neurological: Negative for dizziness.  Psychiatric/Behavioral: The patient is not nervous/anxious.      Physical Exam  Constitutional: She appears well-developed and well-nourished. No distress.  Neck: No thyromegaly present.  Cardiovascular: Normal rate, regular rhythm, normal heart sounds and  intact distal pulses.  Pulmonary/Chest: Effort normal and breath sounds normal. No respiratory distress.  Status post bilateral mastectomies with implants   Abdominal: Soft. Bowel sounds are normal. She exhibits no distension. There is no tenderness.  Musculoskeletal: She exhibits no edema.  Is able to move all extremities   Lymphadenopathy:    She has no cervical adenopathy.  Neurological: She is alert.  Skin: Skin is warm and dry. She is not diaphoretic.  Psychiatric: She has a normal mood and affect.      ASSESSMENT/ PLAN:  TODAY:   1. Lumbar stenosis with neurogenic claudication: is stable will continue tylenol 1300 mg twice daily   2. Hypokalemia: is stable will continue k+ 10 meq daily   3. Depression, major single episode, mild: is stable will continue zoloft 100 mg daily has tolerated coming off remeron   4. Chronic constipation: is stable will continue colace twice daily   PREVIOUS   5. Essential hypertension, benign: has pulmonary htn: is stable b/p 154/39: will continue norvasc 10 mg daily cozaar 100 mg daily lopressor 100 mg twice daily asa 81 mg daily imdur 30 mg daily   6. Rheumatoid arthritis without elevated rheumatoid factor: is stable will continue methotrexate 15 mg weekly; folic acid 1 mg daily; tylenol 1300 mg twice daily   7. Rib pain on the left side: is stable will continue lidoderm patch for 12 hours daily   Will check cbc; cmp; Vit B 12 and Vit D   MD is aware of resident's narcotic use and is in agreement with current plan of care. We will attempt to wean resident as apropriate   Ok Edwards NP [facility]  Contact [phone] Monday through Friday 8am- 5pm  After hours call [phone]

## 2018-01-01 DIAGNOSIS — Z515 Encounter for palliative care: Secondary | ICD-10-CM | POA: Diagnosis not present

## 2018-01-01 DIAGNOSIS — F0391 Unspecified dementia with behavioral disturbance: Secondary | ICD-10-CM | POA: Diagnosis not present

## 2018-01-02 ENCOUNTER — Other Ambulatory Visit
Admission: RE | Admit: 2018-01-02 | Discharge: 2018-01-02 | Disposition: A | Discharge status: Home / Self Care | Payer: PPO | Source: Ambulatory Visit | Attending: Adult Health | Admitting: Adult Health

## 2018-01-02 DIAGNOSIS — I10 Essential (primary) hypertension: Secondary | ICD-10-CM | POA: Diagnosis not present

## 2018-01-02 LAB — COMPREHENSIVE METABOLIC PANEL
ALT: 16 U/L (ref 0–44)
ANION GAP: 5 (ref 5–15)
AST: 22 U/L (ref 15–41)
Albumin: 3.5 g/dL (ref 3.5–5.0)
Alkaline Phosphatase: 58 U/L (ref 38–126)
BUN: 26 mg/dL — ABNORMAL HIGH (ref 8–23)
CHLORIDE: 108 mmol/L (ref 98–111)
CO2: 27 mmol/L (ref 22–32)
Calcium: 8.6 mg/dL — ABNORMAL LOW (ref 8.9–10.3)
Creatinine, Ser: 0.8 mg/dL (ref 0.44–1.00)
GFR calc non Af Amer: >60 mL/min (ref >60)
Glucose, Bld: 93 mg/dL (ref 70–99)
Potassium: 4.2 mmol/L (ref 3.5–5.1)
SODIUM: 140 mmol/L (ref 135–145)
Total Bilirubin: 0.7 mg/dL (ref 0.3–1.2)
Total Protein: 6.3 g/dL — ABNORMAL LOW (ref 6.5–8.1)

## 2018-01-02 LAB — CBC WITH DIFFERENTIAL/PLATELET
ABS IMMATURE GRANULOCYTES: 0.04 10*3/uL (ref 0.00–0.07)
Basophils Absolute: 0.1 10*3/uL (ref 0.0–0.1)
Basophils Relative: 2 %
EOS PCT: 4 %
Eosinophils Absolute: 0.2 10*3/uL (ref 0.0–0.5)
HCT: 36.3 % (ref 36.0–46.0)
Hemoglobin: 12 g/dL (ref 12.0–15.0)
Immature Granulocytes: 1 %
Lymphocytes Relative: 28 %
Lymphs Abs: 1.6 10*3/uL (ref 0.7–4.0)
MCH: 38.7 pg — AB (ref 26.0–34.0)
MCHC: 33.1 g/dL (ref 30.0–36.0)
MCV: 117.1 fL — ABNORMAL HIGH (ref 80.0–100.0)
Monocytes Absolute: 1 10*3/uL (ref 0.1–1.0)
Monocytes Relative: 17 %
NEUTROS PCT: 48 %
NRBC: 0 % (ref 0.0–0.2)
Neutro Abs: 2.8 10*3/uL (ref 1.7–7.7)
PLATELETS: 237 10*3/uL (ref 150–400)
RBC: 3.1 MIL/uL — AB (ref 3.87–5.11)
RDW: 14.5 % (ref 11.5–15.5)
WBC: 5.8 10*3/uL (ref 4.0–10.5)

## 2018-01-02 LAB — VITAMIN B12: Vitamin B-12: 712 pg/mL (ref 180–914)

## 2018-01-03 LAB — VITAMIN D 25 HYDROXY (VIT D DEFICIENCY, FRACTURES): Vit D, 25-Hydroxy: 24.5 ng/mL — ABNORMAL LOW (ref 30.0–100.0)

## 2018-01-15 DIAGNOSIS — Z515 Encounter for palliative care: Secondary | ICD-10-CM | POA: Diagnosis not present

## 2018-01-15 DIAGNOSIS — F0391 Unspecified dementia with behavioral disturbance: Secondary | ICD-10-CM | POA: Diagnosis not present

## 2018-01-27 ENCOUNTER — Encounter
Admission: RE | Admit: 2018-01-27 | Discharge: 2018-01-27 | Disposition: A | Discharge status: Home / Self Care | Payer: PPO | Source: Ambulatory Visit | Attending: Internal Medicine | Admitting: Internal Medicine

## 2018-01-27 DIAGNOSIS — I10 Essential (primary) hypertension: Secondary | ICD-10-CM | POA: Insufficient documentation

## 2018-01-29 ENCOUNTER — Encounter: Payer: Self-pay | Admitting: Adult Health

## 2018-01-29 ENCOUNTER — Non-Acute Institutional Stay (SKILLED_NURSING_FACILITY): Payer: PPO | Admitting: Adult Health

## 2018-01-29 DIAGNOSIS — E876 Hypokalemia: Secondary | ICD-10-CM | POA: Diagnosis not present

## 2018-01-29 DIAGNOSIS — I10 Essential (primary) hypertension: Secondary | ICD-10-CM | POA: Diagnosis not present

## 2018-01-29 DIAGNOSIS — I272 Pulmonary hypertension, unspecified: Secondary | ICD-10-CM | POA: Diagnosis not present

## 2018-01-29 DIAGNOSIS — M0600 Rheumatoid arthritis without rheumatoid factor, unspecified site: Secondary | ICD-10-CM | POA: Diagnosis not present

## 2018-01-29 DIAGNOSIS — R0781 Pleurodynia: Secondary | ICD-10-CM | POA: Diagnosis not present

## 2018-01-29 NOTE — Progress Notes (Signed)
Location:   [place] Room Number: 333 Place of Service:  SNF (31)   CODE STATUS: dnr  Allergies  Allergen Reactions  . Atorvastatin Other (See Comments)    Myositis  . Bisphosphonates Other (See Comments)    GI upset  . Codeine Other (See Comments)  . Memantine Other (See Comments)  . Nsaids Other (See Comments)    GI upset  . Omeprazole Nausea Only  . Other Other (See Comments)    Leg cramps GI upset  . Salsalate Other (See Comments)  . Sulfa Antibiotics     Pt doesn't remember  . Tamoxifen Other (See Comments)    Leg cramps  . Penicillins Itching    Has patient had a PCN reaction causing immediate rash, facial/tongue/throat swelling, SOB or lightheadedness with hypotension: Yes Has patient had a PCN reaction causing severe rash involving mucus membranes or skin necrosis: No Has patient had a PCN reaction that required hospitalization: No Has patient had a PCN reaction occurring within the last 10 years: No If all of the above answers are "NO", then may proceed with Cephalosporin use.    Chief Complaint  Patient presents with  . Medical Management of Chronic Issues    Essential hypertension benign; pulmonary htn; rheumatoid arthritis without elevated rheumatoid factor; hypokalemia; right pain on left side.     HPI:  She is an 84 year old long term resident of this facility being seen for the management of her chronic illnesses; hypertension; pulmonary hypertension; RA; hypokalemia; left rib pain. She denies any uncontrolled pain; no changes in appetite; no insomnia.   Past Medical History:  Diagnosis Date  . breast cancer   . DVT (deep venous thrombosis) ([place]) 06/2005   RLE ; treated x 3 months with Coumadin  . Fibrocystic breast disease    Severe; status post bilateral mastectomies and implants  . GERD (gastroesophageal reflux disease)    Severe; manifested vocal cord irritation, status-post Nissen fundcoplication in 03/9796  . History of ataxia     with questionable right upper and lower  . History of breast cancer   . History of exertional chest pain    with her last stress echo in 09/1999 being normal  . History of migraine    vascular  . History of shingles   . History of syncope 12/1999   With negative cardiac workup to include an echocardiogram, which revealed only mild to moderate MR, a CT scan of the chest, which was negative, and a Cardiolite stress test, which was normal  . Hyperlipidemia, unspecified   . Hypertension    CTA 05/2004 showed no renovascular cause  . Mild dementia ([place])   . Muscle atrophy    Positive FANA and anti- SCL70 antibodies; S/p biopsy without eosinic fascitis; Remicade discontinued  . Osteoarthritis    a. Cervical spine. b. Lumbar spine/spinal stenosis. [facility])  . Osteoporosis    a. Fosamax b. Right wrist fracture. c. Sternal fracture ([place])  . Rheumatoid arthritis without elevated rheumatoid factor (HCC)    a. Methotrexate. b. S/p Remicade. c. Seronegative. d. S/p Plaquenil. [facility])    Past Surgical History:  Procedure Laterality Date  . APPENDECTOMY    . CHOLECYSTECTOMY    . IRRIGATION AND DEBRIDEMENT ABSCESS Right 04/08/2017   Procedure: IRRIGATION AND DEBRIDEMENT ABSCESS with wound vac;  Surgeon: Albertine Patricia, DPM;  Location: [facility] ORS;  Service: Podiatry;  Laterality: Right;  . LAPAROSCOPIC NISSEN FUNDOPLICATION  92/1194  . LUMBAR LAMINECTOMY  for spinal stenosis  . MASTECTOMY Bilateral    with implants  . ORIF DISTAL RADIUS FRACTURE Right 07/27/2008   with volar plate  . TOTAL ABDOMINAL HYSTERECTOMY W/ BILATERAL SALPINGOOPHORECTOMY    . VASCULAR SURGERY      Social History   Socioeconomic History  . Marital status: Widowed    Spouse name: Not on file  . Number of children: 0  . Years of education: 86  . Highest education level: High school graduate  Occupational History  . Not on file  Social Needs  . Financial resource strain: Not on file  . Food insecurity:    Worry:  Not on file    Inability: Not on file  . Transportation needs:    Medical: Not on file    Non-medical: Not on file  Tobacco Use  . Smoking status: Never Smoker  . Smokeless tobacco: Never Used  Substance and Sexual Activity  . Alcohol use: No  . Drug use: No  . Sexual activity: Not Currently  Lifestyle  . Physical activity:    Days per week: Not on file    Minutes per session: Not on file  . Stress: Not on file  Relationships  . Social connections:    Talks on phone: Not on file    Gets together: Not on file    Attends religious service: Not on file    Active member of club or organization: Not on file    Attends meetings of clubs or organizations: Not on file    Relationship status: Not on file  . Intimate partner violence:    Fear of current or ex partner: Not on file    Emotionally abused: Not on file    Physically abused: Not on file    Forced sexual activity: Not on file  Other Topics Concern  . Not on file  Social History Narrative  . Not on file   Family History  Problem Relation Age of Onset  . Heart failure Mother   . Hypertension Mother   . Cancer Father   . Heart attack Sister       VITAL SIGNS BP 103/80   Pulse 78   Temp 98.2 F (36.8 C)   Resp 16   Ht 5\' 3"  (1.6 m)   Wt 148 lb (67.1 kg)   SpO2 96%   BMI 26.22 kg/m   Outpatient Encounter Medications as of 01/29/2018  Medication Sig  . acetaminophen (TYLENOL) 650 MG CR tablet Take 1,300 mg by mouth 2 (two) times daily.   Marland Kitchen amLODipine (NORVASC) 10 MG tablet Take 10 mg by mouth daily.  . Ascorbic Acid (VITAMIN C) 1000 MG tablet Take 1,000 mg by mouth daily.  Marland Kitchen aspirin EC 81 MG tablet Take 81 mg by mouth daily.   . Cholecalciferol (VITAMIN D) 2000 units tablet Take 2,000 Units by mouth daily.  . cyanocobalamin 1000 MCG tablet Take 1,000 mcg by mouth daily.  Marland Kitchen docusate sodium (COLACE) 100 MG capsule Take 1 capsule (100 mg total) by mouth 2 (two) times daily.  . folic acid (FOLVITE) 1 MG tablet  Take 1 mg by mouth daily.  Marland Kitchen ipratropium-albuterol (DUONEB) 0.5-2.5 (3) MG/3ML SOLN Take 3 mLs by nebulization every 6 (six) hours as needed.  . isosorbide mononitrate (IMDUR) 30 MG 24 hr tablet Take 30 mg by mouth daily.   . Lidocaine 4 % PTCH Apply 1 patch topically daily. Left Ribs- Remove patch after 12 hours  . losartan (COZAAR) 100 MG  tablet Take 100 mg by mouth daily.   . methotrexate (RHEUMATREX) 2.5 MG tablet Take 6 tablets by mouth every Friday.   . metoprolol (LOPRESSOR) 100 MG tablet Take 100 mg by mouth 2 (two) times daily.   . Multiple Vitamin (MULTI-VITAMINS) TABS Take 1 tablet by mouth daily.  . NON FORMULARY Diet: Regular  . potassium chloride (MICRO-K) 10 MEQ CR capsule Take 10 mEq by mouth daily.  . sertraline (ZOLOFT) 100 MG tablet Take 100 mg by mouth daily.   No facility-administered encounter medications on file as of 01/29/2018.      SIGNIFICANT DIAGNOSTIC EXAMS   LABS REVIEWED: PREVIOUS:   06-05-17: wbc  8.4; hgb 11.4 hct 34.8; plt 112.8; plt 241; glucose 86; bun 26; creat 0.98; k+ 3.4; na++ 138; ca 8.6 liver normal albumin 3.4; mag 2.0; tsh 1.753; vit B 12: 321; vit D 27.0  TODAY:   01-02-18: wbc 5.8; hgb  12.0; hgb 36.3; mcv 117.1; plt 237; glucose 93; bun 26; creat 0.80; k+ 4.2; na++ 140; ca 8.6 liver normal albumin 3.5 vit B 12: 712; vit D 24.5   Review of Systems  Constitutional: Negative for malaise/fatigue.  Respiratory: Negative for cough and shortness of breath.   Cardiovascular: Negative for chest pain, palpitations and leg swelling.  Gastrointestinal: Negative for abdominal pain, constipation and heartburn.  Musculoskeletal: Negative for back pain, joint pain and myalgias.  Skin: Negative.   Neurological: Negative for dizziness.  Psychiatric/Behavioral: The patient is not nervous/anxious.     Physical Exam  Constitutional: She appears well-developed and well-nourished. No distress.  Neck: No thyromegaly present.  Cardiovascular: Normal rate,  regular rhythm, normal heart sounds and intact distal pulses.  Pulmonary/Chest: Effort normal and breath sounds normal. No respiratory distress.  Status post bilateral mastectomies with implants    Abdominal: Soft. Bowel sounds are normal. She exhibits no distension. There is no tenderness.  Musculoskeletal: She exhibits no edema.  Is able to move all extremities   Lymphadenopathy:    She has no cervical adenopathy.  Neurological: She is alert.  Skin: Skin is warm and dry. She is not diaphoretic.  Psychiatric: She has a normal mood and affect.      ASSESSMENT/ PLAN:  TODAY:   1. Essential hypertension, benign: has pulmonary htn: is stable b/p 103/80: will continue norvasc 10 mg daily cozaar 100 mg daily lopressor 100 mg twice daily asa 81 mg daily imdur 30 mg daily   2. Rheumatoid arthritis without elevated rheumatoid factor: is stable will continue methotrexate 15 mg weekly; folic acid 1 mg daily; tylenol 1300 mg twice daily   3. Rib pain on the left side: is stable will continue lidoderm patch for 12 hours daily   4. Hypokalemia: is stable k+ 4.2 will stop k+ supplement and will check lab in one month.    PREVIOUS   5. Lumbar stenosis with neurogenic claudication: is stable will continue tylenol 1300 mg twice daily   6. Depression, major single episode, mild: is stable will continue zoloft 100 mg daily has tolerated coming off remeron   7. Chronic constipation: is stable will continue colace twice daily      MD is aware of resident's narcotic use and is in agreement with current plan of care. We will attempt to wean resident as apropriate   Ok Edwards NP [facility]  Contact [phone] Monday through Friday 8am- 5pm  After hours call [phone]

## 2018-02-12 ENCOUNTER — Encounter: Payer: Self-pay | Admitting: Adult Health

## 2018-02-12 ENCOUNTER — Non-Acute Institutional Stay (SKILLED_NURSING_FACILITY): Payer: PPO | Admitting: Adult Health

## 2018-02-12 DIAGNOSIS — K219 Gastro-esophageal reflux disease without esophagitis: Secondary | ICD-10-CM | POA: Diagnosis not present

## 2018-02-12 DIAGNOSIS — M0600 Rheumatoid arthritis without rheumatoid factor, unspecified site: Secondary | ICD-10-CM | POA: Diagnosis not present

## 2018-02-12 DIAGNOSIS — I10 Essential (primary) hypertension: Secondary | ICD-10-CM | POA: Diagnosis not present

## 2018-02-12 NOTE — Progress Notes (Signed)
Location:    [place] [facility] Room Number: [place] of Service:  SNF (31)   CODE STATUS: DNR  Allergies  Allergen Reactions  . Atorvastatin Other (See Comments)    Myositis  . Bisphosphonates Other (See Comments)    GI upset  . Codeine Other (See Comments)  . Memantine Other (See Comments)  . Nsaids Other (See Comments)    GI upset  . Omeprazole Nausea Only  . Other Other (See Comments)    Leg cramps GI upset  . Salsalate Other (See Comments)  . Sulfa Antibiotics     Pt doesn't remember  . Tamoxifen Other (See Comments)    Leg cramps  . Penicillins Itching    Has patient had a PCN reaction causing immediate rash, facial/tongue/throat swelling, SOB or lightheadedness with hypotension: Yes Has patient had a PCN reaction causing severe rash involving mucus membranes or skin necrosis: No Has patient had a PCN reaction that required hospitalization: No Has patient had a PCN reaction occurring within the last 10 years: No If all of the above answers are "NO", then may proceed with Cephalosporin use.    Chief Complaint  Patient presents with  . Acute Visit    Care Plan Meeting    HPI:  We have come together for her routine care plan meeting; she does have family present. Her cognition is without change. She has a good appetite. No recent falls; no significant changes in weight. She does have arthritis pain and does see Dr. Lindon Romp. We have discussed her advanced directives; her family was given a MOST form and will fill it out and return it. She continues to be followed for her chronic illnesses including: hypertension; gerd and RA.    Past Medical History:  Diagnosis Date  . breast cancer   . DVT (deep venous thrombosis) ([place]) 06/2005   RLE ; treated x 3 months with Coumadin  . Fibrocystic breast disease    Severe; status post bilateral mastectomies and implants  . GERD (gastroesophageal reflux disease)    Severe; manifested vocal cord  irritation, status-post Nissen fundcoplication in 04/6642  . History of ataxia    with questionable right upper and lower  . History of breast cancer   . History of exertional chest pain    with her last stress echo in 09/1999 being normal  . History of migraine    vascular  . History of shingles   . History of syncope 12/1999   With negative cardiac workup to include an echocardiogram, which revealed only mild to moderate MR, a CT scan of the chest, which was negative, and a Cardiolite stress test, which was normal  . Hyperlipidemia, unspecified   . Hypertension    CTA 05/2004 showed no renovascular cause  . Mild dementia ([place])   . Muscle atrophy    Positive FANA and anti- SCL70 antibodies; S/p biopsy without eosinic fascitis; Remicade discontinued  . Osteoarthritis    a. Cervical spine. b. Lumbar spine/spinal stenosis. [facility])  . Osteoporosis    a. Fosamax b. Right wrist fracture. c. Sternal fracture ([place])  . Rheumatoid arthritis without elevated rheumatoid factor (HCC)    a. Methotrexate. b. S/p Remicade. c. Seronegative. d. S/p Plaquenil. [facility])    Past Surgical History:  Procedure Laterality Date  . APPENDECTOMY    . CHOLECYSTECTOMY    . IRRIGATION AND DEBRIDEMENT ABSCESS Right 04/08/2017   Procedure: IRRIGATION AND DEBRIDEMENT ABSCESS with wound vac;  Surgeon: Albertine Patricia,  DPM;  Location: [facility] ORS;  Service: Podiatry;  Laterality: Right;  . LAPAROSCOPIC NISSEN FUNDOPLICATION  45/4098  . LUMBAR LAMINECTOMY     for spinal stenosis  . MASTECTOMY Bilateral    with implants  . ORIF DISTAL RADIUS FRACTURE Right 07/27/2008   with volar plate  . TOTAL ABDOMINAL HYSTERECTOMY W/ BILATERAL SALPINGOOPHORECTOMY    . VASCULAR SURGERY      Social History   Socioeconomic History  . Marital status: Widowed    Spouse name: Not on file  . Number of children: 0  . Years of education: 24  . Highest education level: High school graduate  Occupational History  . Not on file  Social  Needs  . Financial resource strain: Not on file  . Food insecurity:    Worry: Not on file    Inability: Not on file  . Transportation needs:    Medical: Not on file    Non-medical: Not on file  Tobacco Use  . Smoking status: Never Smoker  . Smokeless tobacco: Never Used  Substance and Sexual Activity  . Alcohol use: No  . Drug use: No  . Sexual activity: Not Currently  Lifestyle  . Physical activity:    Days per week: Not on file    Minutes per session: Not on file  . Stress: Not on file  Relationships  . Social connections:    Talks on phone: Not on file    Gets together: Not on file    Attends religious service: Not on file    Active member of club or organization: Not on file    Attends meetings of clubs or organizations: Not on file    Relationship status: Not on file  . Intimate partner violence:    Fear of current or ex partner: Not on file    Emotionally abused: Not on file    Physically abused: Not on file    Forced sexual activity: Not on file  Other Topics Concern  . Not on file  Social History Narrative  . Not on file   Family History  Problem Relation Age of Onset  . Heart failure Mother   . Hypertension Mother   . Cancer Father   . Heart attack Sister       VITAL SIGNS BP 110/60   Pulse 68   Temp (!) 97.2 F (36.2 C)   Resp 16   Ht 5\' 3"  (1.6 m)   Wt 149 lb 4.8 oz (67.7 kg)   SpO2 95%   BMI 26.45 kg/m   Outpatient Encounter Medications as of 02/12/2018  Medication Sig  . acetaminophen (TYLENOL) 650 MG CR tablet Take 1,300 mg by mouth 2 (two) times daily.   Marland Kitchen amLODipine (NORVASC) 10 MG tablet Take 10 mg by mouth daily.  . Ascorbic Acid (VITAMIN C) 1000 MG tablet Take 1,000 mg by mouth daily.  Marland Kitchen aspirin EC 81 MG tablet Take 81 mg by mouth daily.   . Cholecalciferol (VITAMIN D) 2000 units tablet Take 2,000 Units by mouth daily.  . cyanocobalamin 1000 MCG tablet Take 1,000 mcg by mouth daily.  Marland Kitchen docusate sodium (COLACE) 100 MG capsule Take  1 capsule (100 mg total) by mouth 2 (two) times daily.  . folic acid (FOLVITE) 1 MG tablet Take 1 mg by mouth daily.  Marland Kitchen ipratropium-albuterol (DUONEB) 0.5-2.5 (3) MG/3ML SOLN Take 3 mLs by nebulization every 6 (six) hours as needed.  . isosorbide mononitrate (IMDUR) 30 MG 24 hr tablet Take 30  mg by mouth daily.   Marland Kitchen losartan (COZAAR) 100 MG tablet Take 100 mg by mouth daily.   . methotrexate (RHEUMATREX) 2.5 MG tablet Take 6 tablets by mouth every Friday.   . metoprolol (LOPRESSOR) 100 MG tablet Take 100 mg by mouth 2 (two) times daily.   . Multiple Vitamin (MULTI-VITAMINS) TABS Take 1 tablet by mouth daily.  . NON FORMULARY Diet: Regular  . sertraline (ZOLOFT) 100 MG tablet Take 100 mg by mouth daily.  . [DISCONTINUED] Lidocaine 4 % PTCH Apply 1 patch topically daily. Left Ribs- Remove patch after 12 hours  . [DISCONTINUED] potassium chloride (MICRO-K) 10 MEQ CR capsule Take 10 mEq by mouth daily.   No facility-administered encounter medications on file as of 02/12/2018.      SIGNIFICANT DIAGNOSTIC EXAMS  PREVIOUS:   01-02-18: wbc 5.8; hgb  12.0; hgb 36.3; mcv 117.1; plt 237; glucose 93; bun 26; creat 0.80; k+ 4.2; na++ 140; ca 8.6 liver normal albumin 3.5 vit B 12: 712; vit D 24.5   NO NEW LABS.    Review of Systems  Constitutional: Negative for malaise/fatigue.  Respiratory: Negative for cough and shortness of breath.   Cardiovascular: Negative for chest pain, palpitations and leg swelling.  Gastrointestinal: Negative for abdominal pain, constipation and heartburn.  Musculoskeletal: Negative for back pain, joint pain and myalgias.  Skin: Negative.   Neurological: Negative for dizziness.  Psychiatric/Behavioral: The patient is not nervous/anxious.     Physical Exam Constitutional:      General: She is not in acute distress.    Appearance: She is well-developed. She is not diaphoretic.  Neck:     Thyroid: No thyromegaly.  Cardiovascular:     Rate and Rhythm: Normal rate and  regular rhythm.     Heart sounds: Normal heart sounds.  Pulmonary:     Effort: Pulmonary effort is normal. No respiratory distress.     Breath sounds: Normal breath sounds.     Comments: Status post bilateral mastectomies with implants    Abdominal:     General: Bowel sounds are normal. There is no distension.     Palpations: Abdomen is soft.     Tenderness: There is no abdominal tenderness.  Musculoskeletal:     Right lower leg: No edema.     Left lower leg: No edema.     Comments: Is able to move all extremities   History of lumbar laminectomy   Lymphadenopathy:     Cervical: No cervical adenopathy.  Skin:    General: Skin is warm and dry.  Neurological:     Mental Status: She is alert and oriented to person, place, and time.        ASSESSMENT/ PLAN:  TODAY:   1. Essential hypertension, benign:  2. Rheumatoid arthritis without elevated rheumatoid factor:  3. gerd without esophagitis   Will continue her current medications Will continue her current plan of care  Have given her family a MOST form; they will fill out and return. We did discuss the form/advanced care planning verbalized understanding ( 20 minutes spent)     MD is aware of resident's narcotic use and is in agreement with current plan of care. We will attempt to wean resident as apropriate   Ok Edwards NP [facility]  Contact [phone] Monday through Friday 8am- 5pm  After hours call [phone]

## 2018-02-22 ENCOUNTER — Non-Acute Institutional Stay: Payer: PPO | Admitting: Nurse Practitioner

## 2018-02-22 ENCOUNTER — Non-Acute Institutional Stay (SKILLED_NURSING_FACILITY): Payer: PPO | Admitting: Adult Health

## 2018-02-22 ENCOUNTER — Encounter: Payer: Self-pay | Admitting: Nurse Practitioner

## 2018-02-22 VITALS — HR 82 | Temp 98.0°F | Resp 18 | Wt 149.4 lb

## 2018-02-22 DIAGNOSIS — R252 Cramp and spasm: Secondary | ICD-10-CM | POA: Diagnosis not present

## 2018-02-22 DIAGNOSIS — R630 Anorexia: Secondary | ICD-10-CM | POA: Insufficient documentation

## 2018-02-22 DIAGNOSIS — G8929 Other chronic pain: Secondary | ICD-10-CM

## 2018-02-22 DIAGNOSIS — Z515 Encounter for palliative care: Secondary | ICD-10-CM | POA: Insufficient documentation

## 2018-02-22 NOTE — Progress Notes (Signed)
[facility] Telephone: [phone] Fax: [phone]  PATIENT NAME: [name] [name] DOB: 12-05-1930 MRN: [id number]  PRIMARY CARE PROVIDER:   Idelle Crouch, MD  REFERRING PROVIDER: Dr Ouida Sills; [address] RESPONSIBLE PARTY:  [name] [name] health care power-of-attorney [phone] or [phone]   ASSESSMENT:     I have visited and observed [name] [name]. We talked about purpose of palliative medicine visit. We talked about how her day was. She talked about going to her niece's house for Christmas on an outing. She tolerated that well. She smiled and said that she was very glad that her [name] was able to do that. We talked about last palliative care visit with discussion of facility now being her permanent residence and that she would no longer be able to return home. [name] [name] endorses she was thankful for that discussion as now she knows she can make further arrangements concerning her home. We talked about symptoms of pain and shortness of breath what she denies. We talked about weakness which is improving. We talked about her appetite which varies depending on what is being served. We talked about her functional level and continue to encourage her Mobility. We talked about attending activities, social events at the facility. We talked about challenges with loss of Independence. Coping strategies. [name] [name] was in agreement. DNR remains in place. We talked about role of palliative medicine and plan of care. Asked if it was okay to contact her [name] and [name] [name] was in agreement for update on palliative care visit. Therapeutic listening and emotional support provided. Questions answered to satisfaction. I have attempted to contact her [name] [name] [name], message left. I have dated nursing staff and any changes at present time to goals of care.  I called [name] [name], [name] [name]. We talked about purpose for palliative medicine visit. We  talked about clinical update and visit with [name] [name]. We talked about her outing going home and [name] [name] endorses that went very well. We talked about last palliative care visit and since [name] Rota has been at peace. She hasn't been asking family to take her home or bring her to her home. [name] [name] feels like she's settled in now with the understanding this is her home at [facility]. [name] [name] endorses she is very thankful for that discussion as it helped tremendously bring peace and closure for [name] [name] and family. We talked about symptoms, appetite. We talked about her functional-level. We talked about medical goals of care including aggressive versus conservative versus comfort care. We talked about most form although [name] [name] was not in attendance for this last care plan meeting. [name] [name] endorses that her daughter was there and they have the most form. We briefly review the most form. [name] [name] was going to contact her daughter they have dual Healthcare power of attorney to review the most form and if have questions we can set up a meeting to review and complete if needed. Talked about role of palliative medicine and plan of care. DNR remains in place. Discuss that will follow up and three months if needed or sooner should she declined unless family wants to complete most form will meet sooner. Therapeutic listening and emotional support provided. Questions answered to satisfaction. Contact information provided.  11 / 4 / 2018 weight 149.0lb 02/12/2018 weight 149.4lb  RECOMMENDATIONS and PLAN:   1.Palliative care encounter Z51.5; Palliative medicine team will continue to support patient, patient's family, and medical team.  Visit consisted of counseling and education dealing with the complex and emotionally intense issues of symptom management and palliative care in the setting of serious and potentially life-threatening illness  2. Anorexia R63.0. secondary to RA, dementia. Support encourage to  continue to go to the dining area for meals. Assistance as needed continues to encourage to feed self. Continue supplements, snacks and weights.  3. Chronic pain G89.29 secondary to RA; monitor on pain scale, monitor efficacy vs adverse side effects. Current regimen effective relief  I spent 75 minutes providing this consultation,  From 11:00 to 12:15pm. More than 50% of the time in this consultation was spent coordinating communication.   HISTORY OF PRESENT ILLNESS:  [name] [name] is a 84 y.o. year old female with multiple medical problems including Breast cancer s/p bilateral mastectomy/implants, rheumatory arthritis, osteoarthritis, Osteoporosis, dementia, hypertension, hyperlipidemia, gerd, history of shingles, total abdominal hysterectomy with bilateral salping oophorectomy, right distal radial fracture s/p ORIF lumbar laminectomy, cholecystectomy, appendectomy. [name] [name] to [facility]. She does transfer to the wheelchair and assistant her adl's. She is mobile as she wishes. She does feed herself and appetite has been Fair. Staff endorses no new concerns, no recent hospitalizations, wounds, Falls, infections. DNR remains in place. Last primary provider visit 12 / 17 / 2019 floor care plan meeting. She does see Dr. Jefm Bryant for arthritic pain. Family was given a most form to fill out and return. She is able to advocate her needs. At present she is sitting in the wheelchair in the common area, appears comfortable. No visitors present.. Palliative Care was asked to help address goals of care.   CODE STATUS: DNR  PPS: 50% HOSPICE ELIGIBILITY/DIAGNOSIS: TBD  PAST MEDICAL HISTORY:  Past Medical History:  Diagnosis Date  . breast cancer   . DVT (deep venous thrombosis) ([place]) 06/2005   RLE ; treated x 3 months with Coumadin  . Fibrocystic breast disease    Severe; status post bilateral mastectomies and implants  . GERD (gastroesophageal reflux  disease)    Severe; manifested vocal cord irritation, status-post Nissen fundcoplication in 02/6107  . History of ataxia    with questionable right upper and lower  . History of breast cancer   . History of exertional chest pain    with her last stress echo in 09/1999 being normal  . History of migraine    vascular  . History of shingles   . History of syncope 12/1999   With negative cardiac workup to include an echocardiogram, which revealed only mild to moderate MR, a CT scan of the chest, which was negative, and a Cardiolite stress test, which was normal  . Hyperlipidemia, unspecified   . Hypertension    CTA 05/2004 showed no renovascular cause  . Mild dementia ([place])   . Muscle atrophy    Positive FANA and anti- SCL70 antibodies; S/p biopsy without eosinic fascitis; Remicade discontinued  . Osteoarthritis    a. Cervical spine. b. Lumbar spine/spinal stenosis. [facility])  . Osteoporosis    a. Fosamax b. Right wrist fracture. c. Sternal fracture ([place])  . Rheumatoid arthritis without elevated rheumatoid factor (HCC)    a. Methotrexate. b. S/p Remicade. c. Seronegative. d. S/p Plaquenil. [facility])    SOCIAL HX:  Social History   Tobacco Use  . Smoking status: Never Smoker  . Smokeless tobacco: Never Used  Substance Use Topics  . Alcohol use: No    ALLERGIES:  Allergies  Allergen Reactions  . Atorvastatin  Other (See Comments)    Myositis  . Bisphosphonates Other (See Comments)    GI upset  . Codeine Other (See Comments)  . Memantine Other (See Comments)  . Nsaids Other (See Comments)    GI upset  . Omeprazole Nausea Only  . Other Other (See Comments)    Leg cramps GI upset  . Salsalate Other (See Comments)  . Sulfa Antibiotics     Pt doesn't remember  . Tamoxifen Other (See Comments)    Leg cramps  . Penicillins Itching    Has patient had a PCN reaction causing immediate rash, facial/tongue/throat swelling, SOB or lightheadedness with hypotension: Yes Has patient had a PCN  reaction causing severe rash involving mucus membranes or skin necrosis: No Has patient had a PCN reaction that required hospitalization: No Has patient had a PCN reaction occurring within the last 10 years: No If all of the above answers are "NO", then may proceed with Cephalosporin use.     PERTINENT MEDICATIONS:  Outpatient Encounter Medications as of 02/22/2018  Medication Sig  . acetaminophen (TYLENOL) 650 MG CR tablet Take 1,300 mg by mouth 2 (two) times daily.   Marland Kitchen amLODipine (NORVASC) 10 MG tablet Take 10 mg by mouth daily.  . Ascorbic Acid (VITAMIN C) 1000 MG tablet Take 1,000 mg by mouth daily.  Marland Kitchen aspirin EC 81 MG tablet Take 81 mg by mouth daily.   . Cholecalciferol (VITAMIN D) 2000 units tablet Take 2,000 Units by mouth daily.  . cyanocobalamin 1000 MCG tablet Take 1,000 mcg by mouth daily.  Marland Kitchen docusate sodium (COLACE) 100 MG capsule Take 1 capsule (100 mg total) by mouth 2 (two) times daily.  . folic acid (FOLVITE) 1 MG tablet Take 1 mg by mouth daily.  Marland Kitchen ipratropium-albuterol (DUONEB) 0.5-2.5 (3) MG/3ML SOLN Take 3 mLs by nebulization every 6 (six) hours as needed.  . isosorbide mononitrate (IMDUR) 30 MG 24 hr tablet Take 30 mg by mouth daily.   Marland Kitchen losartan (COZAAR) 100 MG tablet Take 100 mg by mouth daily.   . methotrexate (RHEUMATREX) 2.5 MG tablet Take 6 tablets by mouth every Friday.   . metoprolol (LOPRESSOR) 100 MG tablet Take 100 mg by mouth 2 (two) times daily.   . Multiple Vitamin (MULTI-VITAMINS) TABS Take 1 tablet by mouth daily.  . NON FORMULARY Diet: Regular  . sertraline (ZOLOFT) 100 MG tablet Take 100 mg by mouth daily.   No facility-administered encounter medications on file as of 02/22/2018.     PHYSICAL EXAM:   General: NAD, pleasant, female Cardiovascular: regular rate and rhythm Pulmonary: clear ant fields Abdomen: soft, nontender, + bowel sounds GU: no suprapubic tenderness Extremities: no edema, no joint deformities Skin: no rashes Neurological:  Weakness but otherwise nonfocal  Christin Ihor Gully, NP

## 2018-02-24 ENCOUNTER — Encounter: Payer: Self-pay | Admitting: Adult Health

## 2018-02-24 NOTE — Progress Notes (Signed)
Location:   [facility] Room Number: 3 Place of Service:  SNF (31)   CODE STATUS: dnr  Allergies  Allergen Reactions  . Atorvastatin Other (See Comments)    Myositis  . Bisphosphonates Other (See Comments)    GI upset  . Codeine Other (See Comments)  . Memantine Other (See Comments)  . Nsaids Other (See Comments)    GI upset  . Omeprazole Nausea Only  . Other Other (See Comments)    Leg cramps GI upset  . Salsalate Other (See Comments)  . Sulfa Antibiotics     Pt doesn't remember  . Tamoxifen Other (See Comments)    Leg cramps  . Penicillins Itching    Has patient had a PCN reaction causing immediate rash, facial/tongue/throat swelling, SOB or lightheadedness with hypotension: Yes Has patient had a PCN reaction causing severe rash involving mucus membranes or skin necrosis: No Has patient had a PCN reaction that required hospitalization: No Has patient had a PCN reaction occurring within the last 10 years: No If all of the above answers are "NO", then may proceed with Cephalosporin use.    Chief Complaint  Patient presents with  . Acute Visit    leg cramps    HPI:  She is complaining of worsening leg cramps for the past couple of days; they are happening at various times of the day. She feels as though she is not getting relief from her leg cramps.   Past Medical History:  Diagnosis Date  . breast cancer   . DVT (deep venous thrombosis) ([place]) 06/2005   RLE ; treated x 3 months with Coumadin  . Fibrocystic breast disease    Severe; status post bilateral mastectomies and implants  . GERD (gastroesophageal reflux disease)    Severe; manifested vocal cord irritation, status-post Nissen fundcoplication in 07/5463  . History of ataxia    with questionable right upper and lower  . History of breast cancer   . History of exertional chest pain    with her last stress echo in 09/1999 being normal  . History of migraine    vascular  . History of shingles     . History of syncope 12/1999   With negative cardiac workup to include an echocardiogram, which revealed only mild to moderate MR, a CT scan of the chest, which was negative, and a Cardiolite stress test, which was normal  . Hyperlipidemia, unspecified   . Hypertension    CTA 05/2004 showed no renovascular cause  . Mild dementia ([place])   . Muscle atrophy    Positive FANA and anti- SCL70 antibodies; S/p biopsy without eosinic fascitis; Remicade discontinued  . Osteoarthritis    a. Cervical spine. b. Lumbar spine/spinal stenosis. [facility])  . Osteoporosis    a. Fosamax b. Right wrist fracture. c. Sternal fracture ([place])  . Rheumatoid arthritis without elevated rheumatoid factor (HCC)    a. Methotrexate. b. S/p Remicade. c. Seronegative. d. S/p Plaquenil. [facility])    Past Surgical History:  Procedure Laterality Date  . APPENDECTOMY    . CHOLECYSTECTOMY    . IRRIGATION AND DEBRIDEMENT ABSCESS Right 04/08/2017   Procedure: IRRIGATION AND DEBRIDEMENT ABSCESS with wound vac;  Surgeon: Albertine Patricia, DPM;  Location: [facility] ORS;  Service: Podiatry;  Laterality: Right;  . LAPAROSCOPIC NISSEN FUNDOPLICATION  04/5463  . LUMBAR LAMINECTOMY     for spinal stenosis  . MASTECTOMY Bilateral    with implants  . ORIF DISTAL RADIUS FRACTURE Right 07/27/2008   with  volar plate  . TOTAL ABDOMINAL HYSTERECTOMY W/ BILATERAL SALPINGOOPHORECTOMY    . VASCULAR SURGERY      Social History   Socioeconomic History  . Marital status: Widowed    Spouse name: Not on file  . Number of children: 0  . Years of education: 66  . Highest education level: High school graduate  Occupational History  . Not on file  Social Needs  . Financial resource strain: Not on file  . Food insecurity:    Worry: Not on file    Inability: Not on file  . Transportation needs:    Medical: Not on file    Non-medical: Not on file  Tobacco Use  . Smoking status: Never Smoker  . Smokeless tobacco: Never Used  Substance and Sexual  Activity  . Alcohol use: No  . Drug use: No  . Sexual activity: Not Currently  Lifestyle  . Physical activity:    Days per week: Not on file    Minutes per session: Not on file  . Stress: Not on file  Relationships  . Social connections:    Talks on phone: Not on file    Gets together: Not on file    Attends religious service: Not on file    Active member of club or organization: Not on file    Attends meetings of clubs or organizations: Not on file    Relationship status: Not on file  . Intimate partner violence:    Fear of current or ex partner: Not on file    Emotionally abused: Not on file    Physically abused: Not on file    Forced sexual activity: Not on file  Other Topics Concern  . Not on file  Social History Narrative  . Not on file   Family History  Problem Relation Age of Onset  . Heart failure Mother   . Hypertension Mother   . Cancer Father   . Heart attack Sister       VITAL SIGNS BP 130/78   Pulse 70   Temp 98.1 F (36.7 C)   Resp 18   Ht 5\' 3"  (1.6 m)   Wt 146 lb 4.8 oz (66.4 kg)   SpO2 96%   BMI 25.92 kg/m   Outpatient Encounter Medications as of 02/22/2018  Medication Sig  . acetaminophen (TYLENOL) 650 MG CR tablet Take 1,300 mg by mouth 2 (two) times daily.   Marland Kitchen amLODipine (NORVASC) 10 MG tablet Take 10 mg by mouth daily.  . Ascorbic Acid (VITAMIN C) 1000 MG tablet Take 1,000 mg by mouth daily.  Marland Kitchen aspirin EC 81 MG tablet Take 81 mg by mouth daily.   . Cholecalciferol (VITAMIN D) 2000 units tablet Take 2,000 Units by mouth daily.  . cyanocobalamin 1000 MCG tablet Take 1,000 mcg by mouth daily.  Marland Kitchen docusate sodium (COLACE) 100 MG capsule Take 1 capsule (100 mg total) by mouth 2 (two) times daily.  . folic acid (FOLVITE) 1 MG tablet Take 1 mg by mouth daily.  Marland Kitchen ipratropium-albuterol (DUONEB) 0.5-2.5 (3) MG/3ML SOLN Take 3 mLs by nebulization every 6 (six) hours as needed.  . isosorbide mononitrate (IMDUR) 30 MG 24 hr tablet Take 30 mg by mouth  daily.   Marland Kitchen losartan (COZAAR) 100 MG tablet Take 100 mg by mouth daily.   . methotrexate (RHEUMATREX) 2.5 MG tablet Take 6 tablets by mouth every Friday.   . metoprolol (LOPRESSOR) 100 MG tablet Take 100 mg by mouth 2 (two) times daily.   Marland Kitchen  Multiple Vitamin (MULTI-VITAMINS) TABS Take 1 tablet by mouth daily.  . NON FORMULARY Diet: Regular  . sertraline (ZOLOFT) 100 MG tablet Take 100 mg by mouth daily.   No facility-administered encounter medications on file as of 02/22/2018.      SIGNIFICANT DIAGNOSTIC EXAMS   PREVIOUS:   01-02-18: wbc 5.8; hgb  12.0; hgb 36.3; mcv 117.1; plt 237; glucose 93; bun 26; creat 0.80; k+ 4.2; na++ 140; ca 8.6 liver normal albumin 3.5 vit B 12: 712; vit D 24.5   NO NEW LABS.   Review of Systems  Constitutional: Negative for malaise/fatigue.  Respiratory: Negative for cough and shortness of breath.   Cardiovascular: Negative for chest pain, palpitations and leg swelling.  Gastrointestinal: Negative for abdominal pain, constipation and heartburn.  Musculoskeletal: Positive for myalgias. Negative for back pain and joint pain.       Has leg cramps   Skin: Negative.   Neurological: Negative for dizziness.  Psychiatric/Behavioral: The patient is not nervous/anxious.     Physical Exam Constitutional:      General: She is not in acute distress.    Appearance: She is well-developed. She is not diaphoretic.  Neck:     Musculoskeletal: Neck supple.     Thyroid: No thyromegaly.  Cardiovascular:     Rate and Rhythm: Normal rate and regular rhythm.     Pulses: Normal pulses.     Heart sounds: Normal heart sounds.  Pulmonary:     Effort: Pulmonary effort is normal. No respiratory distress.     Breath sounds: Normal breath sounds.  Abdominal:     General: Bowel sounds are normal. There is no distension.     Palpations: Abdomen is soft.     Tenderness: There is no abdominal tenderness.  Musculoskeletal:     Right lower leg: No edema.     Left lower leg:  No edema.     Comments: Status post bilateral mastectomies with implants  Is able to move all extremities   History of lumbar laminectomy    Lymphadenopathy:     Cervical: No cervical adenopathy.  Skin:    General: Skin is warm and dry.  Neurological:     Mental Status: She is alert and oriented to person, place, and time.  Psychiatric:        Mood and Affect: Mood normal.    ASSESSMENT/ PLAN:  TODAY:   1. Leg cramps: is worse; will check bmp and mag to look at electrolyte disturbances; will treat further as indicated.    MD is aware of resident's narcotic use and is in agreement with current plan of care. We will attempt to wean resident as apropriate   Ok Edwards NP [facility]  Contact [phone] Monday through Friday 8am- 5pm  After hours call [phone]

## 2018-02-25 ENCOUNTER — Other Ambulatory Visit
Admission: RE | Admit: 2018-02-25 | Discharge: 2018-02-25 | Disposition: A | Discharge status: Home / Self Care | Payer: PPO | Source: Ambulatory Visit | Attending: Adult Health | Admitting: Adult Health

## 2018-02-25 DIAGNOSIS — I10 Essential (primary) hypertension: Secondary | ICD-10-CM | POA: Insufficient documentation

## 2018-02-25 LAB — BASIC METABOLIC PANEL
ANION GAP: 8 (ref 5–15)
BUN: 13 mg/dL (ref 8–23)
CHLORIDE: 102 mmol/L (ref 98–111)
CO2: 26 mmol/L (ref 22–32)
Calcium: 8.7 mg/dL — ABNORMAL LOW (ref 8.9–10.3)
Creatinine, Ser: 0.74 mg/dL (ref 0.44–1.00)
GFR calc Af Amer: >60 mL/min (ref >60)
Glucose, Bld: 95 mg/dL (ref 70–99)
POTASSIUM: 3.5 mmol/L (ref 3.5–5.1)
SODIUM: 136 mmol/L (ref 135–145)

## 2018-02-25 LAB — MAGNESIUM: MAGNESIUM: 2.2 mg/dL (ref 1.7–2.4)

## 2018-03-01 ENCOUNTER — Non-Acute Institutional Stay (SKILLED_NURSING_FACILITY): Payer: PPO | Admitting: Adult Health

## 2018-03-01 ENCOUNTER — Encounter: Payer: Self-pay | Admitting: Adult Health

## 2018-03-01 DIAGNOSIS — K5909 Other constipation: Secondary | ICD-10-CM

## 2018-03-01 DIAGNOSIS — I272 Pulmonary hypertension, unspecified: Secondary | ICD-10-CM | POA: Diagnosis not present

## 2018-03-01 DIAGNOSIS — F0390 Unspecified dementia without behavioral disturbance: Secondary | ICD-10-CM | POA: Diagnosis not present

## 2018-03-01 DIAGNOSIS — R413 Other amnesia: Secondary | ICD-10-CM | POA: Diagnosis not present

## 2018-03-01 DIAGNOSIS — F320 Major depressive disorder, single episode, mild: Secondary | ICD-10-CM

## 2018-03-01 DIAGNOSIS — M48062 Spinal stenosis, lumbar region with neurogenic claudication: Secondary | ICD-10-CM

## 2018-03-01 DIAGNOSIS — Z0000 Encounter for general adult medical examination without abnormal findings: Secondary | ICD-10-CM | POA: Diagnosis not present

## 2018-03-01 DIAGNOSIS — Z789 Other specified health status: Secondary | ICD-10-CM | POA: Diagnosis not present

## 2018-03-01 DIAGNOSIS — M0600 Rheumatoid arthritis without rheumatoid factor, unspecified site: Secondary | ICD-10-CM | POA: Diagnosis not present

## 2018-03-01 NOTE — Progress Notes (Signed)
Location:   [facility] Room Number: [place] of Service:  SNF (31)   CODE STATUS: DNR  Allergies  Allergen Reactions  . Atorvastatin Other (See Comments)    Myositis  . Bisphosphonates Other (See Comments)    GI upset  . Codeine Other (See Comments)  . Memantine Other (See Comments)  . Nsaids Other (See Comments)    GI upset  . Omeprazole Nausea Only  . Other Other (See Comments)    Leg cramps GI upset  . Salsalate Other (See Comments)  . Sulfa Antibiotics     Pt doesn't remember  . Tamoxifen Other (See Comments)    Leg cramps  . Penicillins Itching    Has patient had a PCN reaction causing immediate rash, facial/tongue/throat swelling, SOB or lightheadedness with hypotension: Yes Has patient had a PCN reaction causing severe rash involving mucus membranes or skin necrosis: No Has patient had a PCN reaction that required hospitalization: No Has patient had a PCN reaction occurring within the last 10 years: No If all of the above answers are "NO", then may proceed with Cephalosporin use.    Chief Complaint  Patient presents with  . Medical Management of Chronic Issues    Lumbar stenosis with neurogenic claudication; depression major single episode mild; chronic constipation.     HPI:  She is a 85 year old long term resident of this facility being seen for the management management of her chronic illnesses: lumbar stenosis; depression; constipation. She denies any feelings of anxiety or depression; no constipation no uncontrolled pain.   Past Medical History:  Diagnosis Date  . breast cancer   . DVT (deep venous thrombosis) ([place]) 06/2005   RLE ; treated x 3 months with Coumadin  . Fibrocystic breast disease    Severe; status post bilateral mastectomies and implants  . GERD (gastroesophageal reflux disease)    Severe; manifested vocal cord irritation, status-post Nissen fundcoplication in 02/6071  . History of ataxia    with  questionable right upper and lower  . History of breast cancer   . History of exertional chest pain    with her last stress echo in 09/1999 being normal  . History of migraine    vascular  . History of shingles   . History of syncope 12/1999   With negative cardiac workup to include an echocardiogram, which revealed only mild to moderate MR, a CT scan of the chest, which was negative, and a Cardiolite stress test, which was normal  . Hyperlipidemia, unspecified   . Hypertension    CTA 05/2004 showed no renovascular cause  . Mild dementia ([place])   . Muscle atrophy    Positive FANA and anti- SCL70 antibodies; S/p biopsy without eosinic fascitis; Remicade discontinued  . Osteoarthritis    a. Cervical spine. b. Lumbar spine/spinal stenosis. [facility])  . Osteoporosis    a. Fosamax b. Right wrist fracture. c. Sternal fracture ([place])  . Rheumatoid arthritis without elevated rheumatoid factor (HCC)    a. Methotrexate. b. S/p Remicade. c. Seronegative. d. S/p Plaquenil. [facility])    Past Surgical History:  Procedure Laterality Date  . APPENDECTOMY    . CHOLECYSTECTOMY    . IRRIGATION AND DEBRIDEMENT ABSCESS Right 04/08/2017   Procedure: IRRIGATION AND DEBRIDEMENT ABSCESS with wound vac;  Surgeon: Albertine Patricia, DPM;  Location: [facility] ORS;  Service: Podiatry;  Laterality: Right;  . LAPAROSCOPIC NISSEN FUNDOPLICATION  71/0626  . LUMBAR LAMINECTOMY     for spinal stenosis  .  MASTECTOMY Bilateral    with implants  . ORIF DISTAL RADIUS FRACTURE Right 07/27/2008   with volar plate  . TOTAL ABDOMINAL HYSTERECTOMY W/ BILATERAL SALPINGOOPHORECTOMY    . VASCULAR SURGERY      Social History   Socioeconomic History  . Marital status: Widowed    Spouse name: Not on file  . Number of children: 0  . Years of education: 95  . Highest education level: High school graduate  Occupational History  . Not on file  Social Needs  . Financial resource strain: Not on file  . Food insecurity:    Worry: Not on  file    Inability: Not on file  . Transportation needs:    Medical: Not on file    Non-medical: Not on file  Tobacco Use  . Smoking status: Never Smoker  . Smokeless tobacco: Never Used  Substance and Sexual Activity  . Alcohol use: No  . Drug use: No  . Sexual activity: Not Currently  Lifestyle  . Physical activity:    Days per week: Not on file    Minutes per session: Not on file  . Stress: Not on file  Relationships  . Social connections:    Talks on phone: Not on file    Gets together: Not on file    Attends religious service: Not on file    Active member of club or organization: Not on file    Attends meetings of clubs or organizations: Not on file    Relationship status: Not on file  . Intimate partner violence:    Fear of current or ex partner: Not on file    Emotionally abused: Not on file    Physically abused: Not on file    Forced sexual activity: Not on file  Other Topics Concern  . Not on file  Social History Narrative  . Not on file   Family History  Problem Relation Age of Onset  . Heart failure Mother   . Hypertension Mother   . Cancer Father   . Heart attack Sister       VITAL SIGNS BP 122/62   Pulse 62   Temp (!) 97.4 F (36.3 C)   Resp 16   Ht 5\' 3"  (1.6 m)   Wt 146 lb 8 oz (66.5 kg)   SpO2 100%   BMI 25.95 kg/m   Outpatient Encounter Medications as of 03/01/2018  Medication Sig  . acetaminophen (TYLENOL) 650 MG CR tablet Take 1,300 mg by mouth 2 (two) times daily.   Marland Kitchen amLODipine (NORVASC) 10 MG tablet Take 10 mg by mouth daily.  . Ascorbic Acid (VITAMIN C) 1000 MG tablet Take 1,000 mg by mouth daily.  Marland Kitchen aspirin EC 81 MG tablet Take 81 mg by mouth daily.   . Cholecalciferol (VITAMIN D) 125 MCG (5000 UT) CAPS Take 5,000 Units by mouth daily.   . cyanocobalamin 1000 MCG tablet Take 1,000 mcg by mouth daily.  Marland Kitchen docusate sodium (COLACE) 100 MG capsule Take 1 capsule (100 mg total) by mouth 2 (two) times daily.  . folic acid (FOLVITE) 1 MG  tablet Take 1 mg by mouth daily.  Marland Kitchen ipratropium-albuterol (DUONEB) 0.5-2.5 (3) MG/3ML SOLN Take 3 mLs by nebulization every 6 (six) hours as needed.  . isosorbide mononitrate (IMDUR) 30 MG 24 hr tablet Take 30 mg by mouth daily.   Marland Kitchen losartan (COZAAR) 100 MG tablet Take 100 mg by mouth daily.   . methotrexate (RHEUMATREX) 2.5 MG tablet Take 6  tablets by mouth every Friday.   . metoprolol (LOPRESSOR) 100 MG tablet Take 100 mg by mouth 2 (two) times daily.   . Multiple Vitamin (MULTI-VITAMINS) TABS Take 1 tablet by mouth daily.  . NON FORMULARY Diet: Regular  . sertraline (ZOLOFT) 100 MG tablet Take 100 mg by mouth daily.   No facility-administered encounter medications on file as of 03/01/2018.      SIGNIFICANT DIAGNOSTIC EXAMS  PREVIOUS:   01-02-18: wbc 5.8; hgb  12.0; hgb 36.3; mcv 117.1; plt 237; glucose 93; bun 26; creat 0.80; k+ 4.2; na++ 140; ca 8.6 liver normal albumin 3.5 vit B 12: 712; vit D 24.5   TODAY;   02-25-18: glucose 95; bun 13; creat 0.74;  k+ 3.5; na++ 136 ca 8.7; mag 2.2    Review of Systems  Constitutional: Negative for malaise/fatigue.  Respiratory: Negative for cough and shortness of breath.   Cardiovascular: Negative for chest pain, palpitations and leg swelling.  Gastrointestinal: Negative for abdominal pain, constipation and heartburn.  Musculoskeletal: Negative for back pain, joint pain and myalgias.  Skin: Negative.   Neurological: Negative for dizziness.  Psychiatric/Behavioral: The patient is not nervous/anxious.      Physical Exam Constitutional:      General: She is not in acute distress.    Appearance: She is well-developed. She is not diaphoretic.  Neck:     Musculoskeletal: Neck supple.     Thyroid: No thyromegaly.  Cardiovascular:     Rate and Rhythm: Normal rate and regular rhythm.     Pulses: Normal pulses.     Heart sounds: Normal heart sounds.  Pulmonary:     Effort: Pulmonary effort is normal. No respiratory distress.     Breath  sounds: Normal breath sounds.  Abdominal:     General: Bowel sounds are normal. There is no distension.     Palpations: Abdomen is soft.     Tenderness: There is no abdominal tenderness.  Musculoskeletal:     Right lower leg: No edema.     Left lower leg: No edema.     Comments: Status post bilateral mastectomies with implants  Is able to move all extremities   History of lumbar laminectomy     Lymphadenopathy:     Cervical: No cervical adenopathy.  Skin:    General: Skin is warm and dry.  Neurological:     Mental Status: She is alert and oriented to person, place, and time.  Psychiatric:        Mood and Affect: Mood normal.     ASSESSMENT/ PLAN:  TODAY:   1. Lumbar stenosis with neurogenic claudication: is stable will continue tylenol 1300 mg twice daily   2. Depression, major single episode, mild: is stable will continue zoloft 100 mg daily has tolerated coming off remeron   3. Chronic constipation: is stable will continue colace twice daily   PREVIOUS   4. Essential hypertension, benign: has pulmonary htn: is stable b/p 122/62: will continue norvasc 10 mg daily cozaar 100 mg daily lopressor 100 mg twice daily asa 81 mg daily imdur 30 mg daily   5. Rheumatoid arthritis without elevated rheumatoid factor: is stable will continue methotrexate 15 mg weekly; folic acid 1 mg daily; tylenol 1300 mg twice daily   6. Hypokalemia: is stable k+ 3.5 not on supplement       MD is aware of resident's narcotic use and is in agreement with current plan of care. We will attempt to wean resident as apropriate  Ok Edwards NP [facility]  Contact [phone] Monday through Friday 8am- 5pm  After hours call [phone]

## 2018-03-05 ENCOUNTER — Other Ambulatory Visit
Admission: RE | Admit: 2018-03-05 | Discharge: 2018-03-05 | Disposition: A | Discharge status: Home / Self Care | Payer: PPO | Source: Ambulatory Visit | Attending: Adult Health | Admitting: Adult Health

## 2018-03-05 DIAGNOSIS — E876 Hypokalemia: Secondary | ICD-10-CM | POA: Diagnosis not present

## 2018-03-05 LAB — BASIC METABOLIC PANEL
ANION GAP: 7 (ref 5–15)
BUN: 12 mg/dL (ref 8–23)
CALCIUM: 8.7 mg/dL — AB (ref 8.9–10.3)
CHLORIDE: 106 mmol/L (ref 98–111)
CO2: 25 mmol/L (ref 22–32)
Creatinine, Ser: 0.63 mg/dL (ref 0.44–1.00)
GFR calc non Af Amer: >60 mL/min (ref >60)
GLUCOSE: 96 mg/dL (ref 70–99)
POTASSIUM: 3.8 mmol/L (ref 3.5–5.1)
Sodium: 138 mmol/L (ref 135–145)

## 2018-03-30 ENCOUNTER — Encounter
Admission: RE | Admit: 2018-03-30 | Discharge: 2018-03-30 | Disposition: A | Discharge status: Home / Self Care | Payer: PPO | Source: Ambulatory Visit | Attending: Internal Medicine | Admitting: Internal Medicine

## 2018-03-30 DIAGNOSIS — I10 Essential (primary) hypertension: Secondary | ICD-10-CM | POA: Insufficient documentation

## 2018-04-01 ENCOUNTER — Encounter: Payer: Self-pay | Admitting: Adult Health

## 2018-04-01 ENCOUNTER — Non-Acute Institutional Stay (SKILLED_NURSING_FACILITY): Payer: PPO | Admitting: Adult Health

## 2018-04-01 DIAGNOSIS — E876 Hypokalemia: Secondary | ICD-10-CM | POA: Diagnosis not present

## 2018-04-01 DIAGNOSIS — I272 Pulmonary hypertension, unspecified: Secondary | ICD-10-CM | POA: Diagnosis not present

## 2018-04-01 DIAGNOSIS — M0600 Rheumatoid arthritis without rheumatoid factor, unspecified site: Secondary | ICD-10-CM | POA: Diagnosis not present

## 2018-04-01 DIAGNOSIS — I10 Essential (primary) hypertension: Secondary | ICD-10-CM

## 2018-04-01 NOTE — Progress Notes (Signed)
[facility]   [facility] Room Number: [place] of Service:  SNF (31)   CODE STATUS: DNR  Allergies  Allergen Reactions  . Atorvastatin Other (See Comments)    Myositis  . Bisphosphonates Other (See Comments)    GI upset  . Codeine Other (See Comments)  . Memantine Other (See Comments)  . Nsaids Other (See Comments)    GI upset  . Omeprazole Nausea Only  . Other Other (See Comments)    Leg cramps GI upset  . Salsalate Other (See Comments)  . Sulfa Antibiotics     Pt doesn't remember  . Tamoxifen Other (See Comments)    Leg cramps  . Penicillins Itching    Has patient had a PCN reaction causing immediate rash, facial/tongue/throat swelling, SOB or lightheadedness with hypotension: Yes Has patient had a PCN reaction causing severe rash involving mucus membranes or skin necrosis: No Has patient had a PCN reaction that required hospitalization: No Has patient had a PCN reaction occurring within the last 10 years: No If all of the above answers are "NO", then may proceed with Cephalosporin use.    Chief Complaint  Patient presents with  . Medical Management of Chronic Issues    Pulmonary HTN; essential hypertension benign; rheumatoid arthritis without elevated rheumatoid factor; hypokalemia     HPI:  She is a 85 year old long term resident of this facility being seen for the management of her chronic illnesses: pulmonary htn; hypertension; RA; hypokalemia. She denies any uncontrolled pain; no changes in appetite; no insomnia; no anxiety or depressive thoughts.   Past Medical History:  Diagnosis Date  . breast cancer   . DVT (deep venous thrombosis) ([place]) 06/2005   RLE ; treated x 3 months with Coumadin  . Fibrocystic breast disease    Severe; status post bilateral mastectomies and implants  . GERD (gastroesophageal reflux disease)    Severe; manifested vocal cord irritation, status-post Nissen fundcoplication in 06/275  . History of ataxia     with questionable right upper and lower  . History of breast cancer   . History of exertional chest pain    with her last stress echo in 09/1999 being normal  . History of migraine    vascular  . History of shingles   . History of syncope 12/1999   With negative cardiac workup to include an echocardiogram, which revealed only mild to moderate MR, a CT scan of the chest, which was negative, and a Cardiolite stress test, which was normal  . Hyperlipidemia, unspecified   . Hypertension    CTA 05/2004 showed no renovascular cause  . Mild dementia ([place])   . Muscle atrophy    Positive FANA and anti- SCL70 antibodies; S/p biopsy without eosinic fascitis; Remicade discontinued  . Osteoarthritis    a. Cervical spine. b. Lumbar spine/spinal stenosis. [facility])  . Osteoporosis    a. Fosamax b. Right wrist fracture. c. Sternal fracture ([place])  . Rheumatoid arthritis without elevated rheumatoid factor (HCC)    a. Methotrexate. b. S/p Remicade. c. Seronegative. d. S/p Plaquenil. [facility])    Past Surgical History:  Procedure Laterality Date  . APPENDECTOMY    . CHOLECYSTECTOMY    . IRRIGATION AND DEBRIDEMENT ABSCESS Right 04/08/2017   Procedure: IRRIGATION AND DEBRIDEMENT ABSCESS with wound vac;  Surgeon: Albertine Patricia, DPM;  Location: [facility] ORS;  Service: Podiatry;  Laterality: Right;  . LAPAROSCOPIC NISSEN FUNDOPLICATION  41/2878  . LUMBAR LAMINECTOMY  for spinal stenosis  . MASTECTOMY Bilateral    with implants  . ORIF DISTAL RADIUS FRACTURE Right 07/27/2008   with volar plate  . TOTAL ABDOMINAL HYSTERECTOMY W/ BILATERAL SALPINGOOPHORECTOMY    . VASCULAR SURGERY      Social History   Socioeconomic History  . Marital status: Widowed    Spouse name: Not on file  . Number of children: 0  . Years of education: 44  . Highest education level: High school graduate  Occupational History  . Not on file  Social Needs  . Financial resource strain: Not on file  . Food insecurity:    Worry:  Not on file    Inability: Not on file  . Transportation needs:    Medical: Not on file    Non-medical: Not on file  Tobacco Use  . Smoking status: Never Smoker  . Smokeless tobacco: Never Used  Substance and Sexual Activity  . Alcohol use: No  . Drug use: No  . Sexual activity: Not Currently  Lifestyle  . Physical activity:    Days per week: Not on file    Minutes per session: Not on file  . Stress: Not on file  Relationships  . Social connections:    Talks on phone: Not on file    Gets together: Not on file    Attends religious service: Not on file    Active member of club or organization: Not on file    Attends meetings of clubs or organizations: Not on file    Relationship status: Not on file  . Intimate partner violence:    Fear of current or ex partner: Not on file    Emotionally abused: Not on file    Physically abused: Not on file    Forced sexual activity: Not on file  Other Topics Concern  . Not on file  Social History Narrative  . Not on file   Family History  Problem Relation Age of Onset  . Heart failure Mother   . Hypertension Mother   . Cancer Father   . Heart attack Sister       VITAL SIGNS BP 132/62   Pulse 88   Temp 97.8 F (36.6 C)   Resp 18   Ht 5\' 3"  (1.6 m)   Wt 147 lb (66.7 kg)   SpO2 100%   BMI 26.04 kg/m   Outpatient Encounter Medications as of 04/01/2018  Medication Sig  . acetaminophen (TYLENOL) 650 MG CR tablet Take 1,300 mg by mouth 2 (two) times daily.   Marland Kitchen amLODipine (NORVASC) 10 MG tablet Take 10 mg by mouth daily.  . Ascorbic Acid (VITAMIN C) 1000 MG tablet Take 1,000 mg by mouth daily.  Marland Kitchen aspirin EC 81 MG tablet Take 81 mg by mouth daily.   . Cholecalciferol (VITAMIN D) 125 MCG (5000 UT) CAPS Take 5,000 Units by mouth daily.   . cyanocobalamin 1000 MCG tablet Take 1,000 mcg by mouth daily.  Marland Kitchen docusate sodium (COLACE) 100 MG capsule Take 1 capsule (100 mg total) by mouth 2 (two) times daily.  . folic acid (FOLVITE) 1 MG  tablet Take 1 mg by mouth daily.  Marland Kitchen ipratropium-albuterol (DUONEB) 0.5-2.5 (3) MG/3ML SOLN Take 3 mLs by nebulization every 6 (six) hours as needed.  . isosorbide mononitrate (IMDUR) 30 MG 24 hr tablet Take 30 mg by mouth daily.   Marland Kitchen losartan (COZAAR) 100 MG tablet Take 100 mg by mouth daily.   . methotrexate (RHEUMATREX) 2.5 MG tablet  Take 6 tablets by mouth every Friday.   . metoprolol (LOPRESSOR) 100 MG tablet Take 100 mg by mouth 2 (two) times daily.   . Multiple Vitamin (MULTI-VITAMINS) TABS Take 1 tablet by mouth daily.  . NON FORMULARY Diet: Regular  . sertraline (ZOLOFT) 100 MG tablet Take 100 mg by mouth daily.   No facility-administered encounter medications on file as of 04/01/2018.      SIGNIFICANT DIAGNOSTIC EXAMS  PREVIOUS:   01-02-18: wbc 5.8; hgb  12.0; hgb 36.3; mcv 117.1; plt 237; glucose 93; bun 26; creat 0.80; k+ 4.2; na++ 140; ca 8.6 liver normal albumin 3.5 vit B 12: 712; vit D 24.5  02-25-18: glucose 95; bun 13; creat 0.74;  k+ 3.5; na++ 136 ca 8.7; mag 2.2   TODAY:   03-05-18: glucose 96 bun 12; creat 0.63 k+ 3.8 na++ 138 ca 8.7   Review of Systems  Constitutional: Negative for malaise/fatigue.  Respiratory: Negative for cough and shortness of breath.   Cardiovascular: Negative for chest pain, palpitations and leg swelling.  Gastrointestinal: Negative for abdominal pain, constipation and heartburn.  Musculoskeletal: Negative for back pain, joint pain and myalgias.  Skin: Negative.   Neurological: Negative for dizziness.  Psychiatric/Behavioral: The patient is not nervous/anxious.    Physical Exam Constitutional:      General: She is not in acute distress.    Appearance: She is well-developed. She is not diaphoretic.  Neck:     Musculoskeletal: Neck supple.     Thyroid: No thyromegaly.  Cardiovascular:     Rate and Rhythm: Normal rate and regular rhythm.     Pulses: Normal pulses.     Heart sounds: Normal heart sounds.  Pulmonary:     Effort: Pulmonary  effort is normal. No respiratory distress.     Breath sounds: Normal breath sounds.  Abdominal:     General: Bowel sounds are normal. There is no distension.     Palpations: Abdomen is soft.     Tenderness: There is no abdominal tenderness.  Musculoskeletal:     Right lower leg: No edema.     Left lower leg: No edema.     Comments: Status post bilateral mastectomies with implants  Is able to move all extremities   History of lumbar laminectomy    Lymphadenopathy:     Cervical: No cervical adenopathy.  Skin:    General: Skin is warm and dry.  Neurological:     Mental Status: She is alert and oriented to person, place, and time.  Psychiatric:        Mood and Affect: Mood normal.      ASSESSMENT/ PLAN:  TODAY:   1. Essential hypertension, benign: has pulmonary htn: is stable b/p 132/62: will continue norvasc 10 mg daily cozaar 100 mg daily lopressor 100 mg twice daily asa 81 mg daily imdur 30 mg daily   2. Rheumatoid arthritis without elevated rheumatoid factor: is stable will continue methotrexate 15 mg weekly; folic acid 1 mg daily; tylenol 1300 mg twice daily   Will have her return to rheumatologist   3. Hypokalemia: is stable k+ 3.8 not on supplement   PREVIOUS   4. Lumbar stenosis with neurogenic claudication: is stable will continue tylenol 1300 mg twice daily   5. Depression, major single episode, mild: is stable will continue zoloft 100 mg daily has tolerated coming off remeron   6. Chronic constipation: is stable will continue colace twice daily    Is due for eye exam 04-09-18  MD is aware of resident's narcotic use and is in agreement with current plan of care. We will attempt to wean resident as apropriate   Meagan Ancona NP [facility]  Contact [phone] Monday through Friday 8am- 5pm  After hours call [phone]  

## 2018-04-04 DIAGNOSIS — G629 Polyneuropathy, unspecified: Secondary | ICD-10-CM | POA: Diagnosis not present

## 2018-04-04 DIAGNOSIS — M0600 Rheumatoid arthritis without rheumatoid factor, unspecified site: Secondary | ICD-10-CM | POA: Diagnosis not present

## 2018-04-04 DIAGNOSIS — Z79899 Other long term (current) drug therapy: Secondary | ICD-10-CM | POA: Diagnosis not present

## 2018-04-08 DIAGNOSIS — M0600 Rheumatoid arthritis without rheumatoid factor, unspecified site: Secondary | ICD-10-CM | POA: Diagnosis not present

## 2018-04-08 DIAGNOSIS — I272 Pulmonary hypertension, unspecified: Secondary | ICD-10-CM | POA: Diagnosis not present

## 2018-04-08 DIAGNOSIS — Z789 Other specified health status: Secondary | ICD-10-CM | POA: Diagnosis not present

## 2018-04-08 DIAGNOSIS — F0390 Unspecified dementia without behavioral disturbance: Secondary | ICD-10-CM | POA: Diagnosis not present

## 2018-04-10 ENCOUNTER — Ambulatory Visit (INDEPENDENT_AMBULATORY_CARE_PROVIDER_SITE_OTHER): Payer: PPO | Admitting: Podiatry

## 2018-04-10 ENCOUNTER — Encounter: Payer: Self-pay | Admitting: Podiatry

## 2018-04-10 DIAGNOSIS — B351 Tinea unguium: Secondary | ICD-10-CM | POA: Diagnosis not present

## 2018-04-10 DIAGNOSIS — H35373 Puckering of macula, bilateral: Secondary | ICD-10-CM | POA: Diagnosis not present

## 2018-04-10 DIAGNOSIS — M79676 Pain in unspecified toe(s): Secondary | ICD-10-CM | POA: Diagnosis not present

## 2018-04-10 NOTE — Progress Notes (Signed)
She presents today chief complaint of painful elongated toenails bilaterally.  She states that this point corner so at the examiner she points to the fibular border of the hallux right.  Objective: Vital signs are stable alert oriented x3.  There is no erythema edema cellulitis drainage or odor.  Sharply rated nail margin along the fibular border of the hallux right toenails are thick yellow dystrophic clinically mycotic painful palpation.  Assessment: Pain in limb secondary to onychomycosis and ingrown nail.  Plan: Debridement of toenails 1 through 5 bilateral follow-up as needed.

## 2018-04-17 ENCOUNTER — Encounter: Payer: Self-pay | Admitting: Adult Health

## 2018-04-17 ENCOUNTER — Non-Acute Institutional Stay (SKILLED_NURSING_FACILITY): Payer: PPO | Admitting: Adult Health

## 2018-04-17 DIAGNOSIS — M0600 Rheumatoid arthritis without rheumatoid factor, unspecified site: Secondary | ICD-10-CM | POA: Diagnosis not present

## 2018-04-17 DIAGNOSIS — I10 Essential (primary) hypertension: Secondary | ICD-10-CM | POA: Diagnosis not present

## 2018-04-17 DIAGNOSIS — F320 Major depressive disorder, single episode, mild: Secondary | ICD-10-CM | POA: Diagnosis not present

## 2018-04-17 NOTE — Progress Notes (Signed)
[facility]  [facility] [place] Number: [address] Place of Service:  SNF (31) Provider:  Durenda Age, NP  [name], MD as PCP - General (Internal Medicine) [name] Fee, NP as Nurse Practitioner (Geriatric Medicine)  Extended Emergency Contact Information Primary Emergency Contact: [name],[name] Address: [place] [name] [name] of [place] Phone: [phone] Work Phone: [phone] Relation: Sister Secondary Emergency Contact: Clark Mobile Phone: [phone] Relation: Niece  Code Status:  DNR  Goals of care: Advanced Directive information Advanced Directives 04/01/2018  Does Patient Have a Medical Advance Directive? Yes  Type of Paramedic of [place];Out of facility DNR (pink MOST or yellow form)  Does patient want to make changes to medical advance directive? No - Patient declined  Copy of [place] in Chart? Yes - validated most recent copy scanned in chart (See row information)  Would patient like information on creating a medical advance directive? -  Pre-existing out of facility DNR order (yellow form or pink MOST form) Yellow form placed in chart (order not valid for inpatient use)     Chief Complaint  Patient presents with  . Discharge Note    Patient to transfer to [facility] on 83/21/2020.    HPI:   Pt is an 85 y.o. female seen today for a discharge visit.  She is to transfer to [facility] on 04/19/2018.  She has a PMH of lumbar spinal stenosis, rheumatoid arthritis, pulmonary hypretension, essential HTN, dementia, history of falls, and major depressive disorder. She has been admitted to [facility] on 03/20/17 after hospitalization for closed fracture of right foot, left wrist injury and UTI. 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    Past Medical History:  Diagnosis Date  . breast  cancer   . DVT (deep venous thrombosis) ([place]) 06/2005   RLE ; treated x 3 months with Coumadin  . Fibrocystic breast disease    Severe; status post bilateral mastectomies and implants  . GERD (gastroesophageal reflux disease)    Severe; manifested vocal cord irritation, status-post Nissen fundcoplication in 10/5186  . History of ataxia    with questionable right upper and lower  . History of breast cancer   . History of exertional chest pain    with her last stress echo in 09/1999 being normal  . History of migraine    vascular  . History of shingles   . History of syncope 12/1999   With negative cardiac workup to include an echocardiogram, which revealed only mild to moderate MR, a CT scan of the chest, which was negative, and a Cardiolite stress test, which was normal  . Hyperlipidemia, unspecified   . Hypertension    CTA 05/2004 showed no renovascular cause  . Mild dementia ([place])   . Muscle atrophy    Positive FANA and anti- SCL70 antibodies; S/p biopsy without eosinic fascitis; Remicade discontinued  . Osteoarthritis    a. Cervical spine. b. Lumbar spine/spinal stenosis. [facility])  . Osteoporosis    a. Fosamax b. Right wrist fracture. c. Sternal fracture ([place])  . Rheumatoid arthritis without elevated rheumatoid factor (HCC)    a. Methotrexate. b. S/p Remicade. c. Seronegative. d. S/p Plaquenil. [facility])   Past Surgical History:  Procedure Laterality Date  . APPENDECTOMY    . CHOLECYSTECTOMY    . IRRIGATION AND DEBRIDEMENT ABSCESS Right 04/08/2017   Procedure:  IRRIGATION AND DEBRIDEMENT ABSCESS with wound vac;  Surgeon: Albertine Patricia, DPM;  Location: [facility] ORS;  Service: Podiatry;  Laterality: Right;  . LAPAROSCOPIC NISSEN FUNDOPLICATION  76/8088  . LUMBAR LAMINECTOMY     for spinal stenosis  . MASTECTOMY Bilateral    with implants  . ORIF DISTAL RADIUS FRACTURE Right 07/27/2008   with volar plate  . TOTAL ABDOMINAL HYSTERECTOMY W/ BILATERAL SALPINGOOPHORECTOMY    . VASCULAR  SURGERY      Allergies  Allergen Reactions  . Atorvastatin Other (See Comments)    Myositis  . Bisphosphonates Other (See Comments)    GI upset  . Codeine Other (See Comments)  . Memantine Other (See Comments)  . Nsaids Other (See Comments)    GI upset  . Omeprazole Nausea Only  . Other Other (See Comments)    Leg cramps GI upset  . Salsalate Other (See Comments)  . Sulfa Antibiotics     Pt doesn't remember  . Tamoxifen Other (See Comments)    Leg cramps  . Penicillins Itching    Has patient had a PCN reaction causing immediate rash, facial/tongue/throat swelling, SOB or lightheadedness with hypotension: Yes Has patient had a PCN reaction causing severe rash involving mucus membranes or skin necrosis: No Has patient had a PCN reaction that required hospitalization: No Has patient had a PCN reaction occurring within the last 10 years: No If all of the above answers are "NO", then may proceed with Cephalosporin use.    Outpatient Encounter Medications as of 04/17/2018  Medication Sig  . acetaminophen (TYLENOL) 650 MG CR tablet Take 1,300 mg by mouth 2 (two) times daily.   Marland Kitchen amLODipine (NORVASC) 10 MG tablet Take 10 mg by mouth daily.  . Ascorbic Acid (VITAMIN C) 1000 MG tablet Take 1,000 mg by mouth daily.  Marland Kitchen aspirin EC 81 MG tablet Take 81 mg by mouth daily.   . Cholecalciferol (VITAMIN D) 125 MCG (5000 UT) CAPS Take 5,000 Units by mouth daily.   . cyanocobalamin 1000 MCG tablet Take 1,000 mcg by mouth daily.  Marland Kitchen docusate sodium (COLACE) 100 MG capsule Take 1 capsule (100 mg total) by mouth 2 (two) times daily.  . folic acid (FOLVITE) 1 MG tablet Take 1 mg by mouth daily.  Marland Kitchen ipratropium-albuterol (DUONEB) 0.5-2.5 (3) MG/3ML SOLN Take 3 mLs by nebulization every 6 (six) hours as needed.  . isosorbide mononitrate (IMDUR) 30 MG 24 hr tablet Take 30 mg by mouth daily.   Marland Kitchen losartan (COZAAR) 100 MG tablet Take 100 mg by mouth daily.   . methotrexate (RHEUMATREX) 2.5 MG tablet Take 6  tablets by mouth every Friday.   . metoprolol (LOPRESSOR) 100 MG tablet Take 100 mg by mouth 2 (two) times daily.   . Multiple Vitamin (MULTI-VITAMINS) TABS Take 1 tablet by mouth daily.  . NON FORMULARY Diet: Regular  . sertraline (ZOLOFT) 100 MG tablet Take 100 mg by mouth daily.   No facility-administered encounter medications on file as of 04/17/2018.     Review of Systems  GENERAL: No change in appetite, no fatigue, no weight changes, no fever, chills or weakness MOUTH and THROAT: Denies oral discomfort, gingival pain or bleeding RESPIRATORY: no cough, SOB, DOE, wheezing, hemoptysis CARDIAC: No chest pain, edema or palpitations GI: No abdominal pain, diarrhea, constipation, heart burn, nausea or vomiting GU: Denies dysuria, frequency, hematuria, incontinence, or discharge NEUROLOGICAL: Denies dizziness, syncope, numbness, or headache PSYCHIATRIC: Denies feelings of depression or anxiety. No report of hallucinations, insomnia, paranoia, or  agitation    Immunization History  Administered Date(s) Administered  . Influenza,inj,Quad PF,6+ Mos 01/22/2017  . Influenza-Unspecified 11/27/2013, 12/14/2017  . Pneumococcal Conjugate-13 10/25/2017  . Pneumococcal Polysaccharide-23 03/27/2013   Pertinent  Health Maintenance Due  Topic Date Due  . INFLUENZA VACCINE  Completed  . PNA vac Low Risk Adult  Completed  . DEXA SCAN  Discontinued   Fall Risk  03/04/2018  Falls in the past year? 0     Vitals:   04/17/18 1417  BP: 131/60  Pulse: 60  Temp: (!) 97.2 F (36.2 C)  TempSrc: Oral  Weight: 144 lb 9.6 oz (65.6 kg)  Height: 5\' 3"  (1.6 m)   Body mass index is 25.61 kg/m.  Physical Exam  GENERAL APPEARANCE: Well nourished. In no acute distress. Normal body habitus SKIN:  Skin is warm and dry.  MOUTH and THROAT: Lips are without lesions. Oral mucosa is moist and without lesions. Tongue is normal in shape, size, and color and without lesions RESPIRATORY: Breathing is even &  unlabored, BS CTAB CARDIAC: RRR, no murmur,no extra heart sounds, no edema GI: Abdomen soft, normal BS, no masses, no tenderness NEUROLOGICAL: There is no tremor. Speech is clear. Alert and oriented X 3.  PSYCHIATRIC:  Affect and behavior are appropriate. .   Labs reviewed: Recent Labs    06/05/17 0540 01/02/18 0600 02/25/18 0630 03/05/18 0610  NA 138 140 136 138  K 3.4* 4.2 3.5 3.8  CL 105 108 102 106  CO2 26 27 26 25   GLUCOSE 86 93 95 96  BUN 26* 26* 13 12  CREATININE 0.98 0.80 0.74 0.63  CALCIUM 8.6* 8.6* 8.7* 8.7*  MG 2.0  --  2.2  --    Recent Labs    05/10/17 1200 06/05/17 0540 01/02/18 0600  AST 27 23 22   ALT 17 16 16   ALKPHOS 63 72 58  BILITOT 0.7 0.6 0.7  PROT 6.0* 6.6 6.3*  ALBUMIN 3.4* 3.4* 3.5   Recent Labs    05/10/17 1200 06/05/17 0540 01/02/18 0600  WBC 11.9* 8.4 5.8  NEUTROABS 8.8* 4.8 2.8  HGB 11.8* 11.4* 12.0  HCT 35.4 34.8* 36.3  MCV 112.7* 112.8* 117.1*  PLT 284 241 237   Lab Results  Component Value Date   TSH 1.753 06/05/2017    Assessment/Plan  1. Essential hypertension, benign -Continue isosorbide mononitrate ER 24-hour 30 mg 1 tab daily, losartan 100 mg 1 tab, metoprolol tartrate 100 mg 1 tab twice a day  2. Rheumatoid arthritis without elevated rheumatoid factor (HCC) -Continue methotrexate sodium 2.5 mg give 5 tabs q. Fridays  3. Depression, major, single episode, mild (HCC) -Continue sertraline 100 mg 1 tab daily    I have filled out patient's discharge paperwork.  DME provided:  None  Total discharge time: Greater than 30 minutes Greater than 50% was spent in counseling and coordination of care.  Discharge time involved coordination of the discharge process with Education officer, museum and nursing staff.   Durenda Age, NP [facility] [phone] (Monday-Friday 8:00 a.m. - 5:00 p.m.) [phone] (after hours)

## 2018-04-20 DIAGNOSIS — I272 Pulmonary hypertension, unspecified: Secondary | ICD-10-CM | POA: Diagnosis not present

## 2018-04-20 DIAGNOSIS — M48062 Spinal stenosis, lumbar region with neurogenic claudication: Secondary | ICD-10-CM | POA: Diagnosis not present

## 2018-04-20 DIAGNOSIS — F0391 Unspecified dementia with behavioral disturbance: Secondary | ICD-10-CM | POA: Diagnosis not present

## 2018-04-20 DIAGNOSIS — M0600 Rheumatoid arthritis without rheumatoid factor, unspecified site: Secondary | ICD-10-CM | POA: Diagnosis not present

## 2018-04-20 DIAGNOSIS — R279 Unspecified lack of coordination: Secondary | ICD-10-CM | POA: Diagnosis not present

## 2018-04-20 DIAGNOSIS — Z741 Need for assistance with personal care: Secondary | ICD-10-CM | POA: Diagnosis not present

## 2018-04-20 DIAGNOSIS — R2681 Unsteadiness on feet: Secondary | ICD-10-CM | POA: Diagnosis not present

## 2018-04-20 DIAGNOSIS — L97513 Non-pressure chronic ulcer of other part of right foot with necrosis of muscle: Secondary | ICD-10-CM | POA: Diagnosis not present

## 2018-04-20 DIAGNOSIS — M6281 Muscle weakness (generalized): Secondary | ICD-10-CM | POA: Diagnosis not present

## 2018-04-20 DIAGNOSIS — R41841 Cognitive communication deficit: Secondary | ICD-10-CM | POA: Diagnosis not present

## 2018-04-24 DIAGNOSIS — M069 Rheumatoid arthritis, unspecified: Secondary | ICD-10-CM | POA: Diagnosis not present

## 2018-04-24 DIAGNOSIS — F329 Major depressive disorder, single episode, unspecified: Secondary | ICD-10-CM | POA: Diagnosis not present

## 2018-04-24 DIAGNOSIS — M48061 Spinal stenosis, lumbar region without neurogenic claudication: Secondary | ICD-10-CM | POA: Diagnosis not present

## 2018-04-24 DIAGNOSIS — I272 Pulmonary hypertension, unspecified: Secondary | ICD-10-CM | POA: Diagnosis not present

## 2018-04-24 DIAGNOSIS — F0390 Unspecified dementia without behavioral disturbance: Secondary | ICD-10-CM | POA: Diagnosis not present

## 2018-04-24 DIAGNOSIS — I10 Essential (primary) hypertension: Secondary | ICD-10-CM | POA: Diagnosis not present

## 2018-04-29 DIAGNOSIS — R279 Unspecified lack of coordination: Secondary | ICD-10-CM | POA: Diagnosis not present

## 2018-04-29 DIAGNOSIS — Z741 Need for assistance with personal care: Secondary | ICD-10-CM | POA: Diagnosis not present

## 2018-04-29 DIAGNOSIS — M48062 Spinal stenosis, lumbar region with neurogenic claudication: Secondary | ICD-10-CM | POA: Diagnosis not present

## 2018-04-29 DIAGNOSIS — R2681 Unsteadiness on feet: Secondary | ICD-10-CM | POA: Diagnosis not present

## 2018-04-29 DIAGNOSIS — R41841 Cognitive communication deficit: Secondary | ICD-10-CM | POA: Diagnosis not present

## 2018-04-29 DIAGNOSIS — R278 Other lack of coordination: Secondary | ICD-10-CM | POA: Diagnosis not present

## 2018-04-29 DIAGNOSIS — M6281 Muscle weakness (generalized): Secondary | ICD-10-CM | POA: Diagnosis not present

## 2018-05-01 DIAGNOSIS — N189 Chronic kidney disease, unspecified: Secondary | ICD-10-CM | POA: Diagnosis not present

## 2018-05-01 DIAGNOSIS — D649 Anemia, unspecified: Secondary | ICD-10-CM | POA: Diagnosis not present

## 2018-05-09 ENCOUNTER — Other Ambulatory Visit: Payer: Self-pay | Admitting: *Deleted

## 2018-05-09 ENCOUNTER — Other Ambulatory Visit: Payer: Self-pay

## 2018-05-09 ENCOUNTER — Non-Acute Institutional Stay: Payer: PPO | Admitting: Primary Care

## 2018-05-09 DIAGNOSIS — M0600 Rheumatoid arthritis without rheumatoid factor, unspecified site: Secondary | ICD-10-CM

## 2018-05-09 DIAGNOSIS — R630 Anorexia: Secondary | ICD-10-CM

## 2018-05-09 DIAGNOSIS — Z515 Encounter for palliative care: Secondary | ICD-10-CM | POA: Diagnosis not present

## 2018-05-09 DIAGNOSIS — F0390 Unspecified dementia without behavioral disturbance: Secondary | ICD-10-CM | POA: Diagnosis not present

## 2018-05-09 NOTE — Patient Outreach (Signed)
[place] [facility]) Care Management  05/09/2018  [name] [name] [facility] [id number] [phone]   Per PatientPing this patient admitted to skilled facility, noted that they are a [facility] patient.  This patient will be followed by [facility] in the community. [facility] will provide full case management services.  [facility] care management services are not appropriate at this time.   Plan to sign off. Will collaborate with [facility] as indicated.   Royetta Crochet. Laymond Purser, MSN, RN, [employer] , [place] [phone]) Business Cell  [phone]) Toll Free Office

## 2018-05-09 NOTE — Progress Notes (Signed)
[facility] Pall[facility] Care Consult Note Telephone: [phone]  Fax: [phone]  PATIENT NAME: [name] TULLO DOB: 08/08/1930 MRN: [id number]  PRIMARY CARE PROVIDER:   Idelle Crouch, MD  Marisa Hua MD  REFERRING PROVIDER:  Idelle Crouch, MD [facility] [place], [place] [address]  RESPONSIBLE PARTY:    Extended Emergency Contact Information Primary Emergency Contact: [name],[name] Address: [address]          [place] [address] [place] Phone: [phone] Work Phone: [phone] Relation: Sister Secondary Emergency Contact: [place] Phone: [phone] Relation: Niece  ASSESSMENT and RECOMMENDATIONS:   1. Debility: Recommend dietary supplements, states appetite fair. States no weight loss. Wts 148-149.  Goes to dining room for meals. Uses chair for ambulation and states her safety limits. Denies falls.   2. Pain in foot: Recommend continued acetaminophen ATC. S/p wound, with pedal edema.States tenderness.  No open wounds at this time. Currently on ATC extended release tylenol.  3. Goals of care: DNR, MOST with DNR, comfort measures, abx , iv ok, no feeding tube. States she is going to bible studies, bingo, puzzles.  Getting ready to play Family Feud; new roommate is motivating her to participate in events. Goes out to church when she's able. Voices feelings of disruption from having to move from another place but feels she will settle in. T/c to [name], sister and POA to introduce self and gave my contact information. No concerns voiced by POA.  Palliative Care to follow for goals of care clarification and symptom management. Return 4 weeks approx. Or PRN.   I spent 25 minutes providing this consultation,  from 1330 to 1355. More than 50% of the time in this consultation was spent coordinating communication.   HISTORY OF PRESENT ILLNESS:  [name] [name] is a 85 y.o.  year old female with multiple medical problems including RA, dementia, osteoporosis, OA, s/p breast CA. Palliative Care was asked to help address goals of care.   CODE STATUS: DNR, MOST with DNR, comfort measures, abx , iv ok, no feeding tube.   PPS: 30% HOSPICE ELIGIBILITY/DIAGNOSIS: TBD  PAST MEDICAL HISTORY:  Past Medical History:  Diagnosis Date  . breast cancer   . DVT (deep venous thrombosis) ([place]) 06/2005   RLE ; treated x 3 months with Coumadin  . Fibrocystic breast disease    Severe; status post bilateral mastectomies and implants  . GERD (gastroesophageal reflux disease)    Severe; manifested vocal cord irritation, status-post Nissen fundcoplication in 10/9355  . History of ataxia    with questionable right upper and lower  . History of breast cancer   . History of exertional chest pain    with her last stress echo in 09/1999 being normal  . History of migraine    vascular  . History of shingles   . History of syncope 12/1999   With negative cardiac workup to include an echocardiogram, which revealed only mild to moderate MR, a CT scan of the chest, which was negative, and a Cardiolite stress test, which was normal  . Hyperlipidemia, unspecified   . Hypertension    CTA 05/2004 showed no renovascular cause  . Mild dementia ([place])   . Muscle atrophy    Positive FANA and anti- SCL70 antibodies; S/p biopsy without eosinic fascitis; Remicade discontinued  . Osteoarthritis    a. Cervical spine. b. Lumbar spine/spinal stenosis. [facility])  . Osteoporosis    a. Fosamax  b. Right wrist fracture. c. Sternal fracture ([place])  . Rheumatoid arthritis without elevated rheumatoid factor (HCC)    a. Methotrexate. b. S/p Remicade. c. Seronegative. d. S/p Plaquenil. [facility])    SOCIAL HX:  Social History   Tobacco Use  . Smoking status: Never Smoker  . Smokeless tobacco: Never Used  Substance Use Topics  . Alcohol use: No    ALLERGIES:  Allergies  Allergen Reactions  . Atorvastatin  Other (See Comments)    Myositis  . Bisphosphonates Other (See Comments)    GI upset  . Codeine Other (See Comments)  . Memantine Other (See Comments)  . Nsaids Other (See Comments)    GI upset  . Omeprazole Nausea Only  . Other Other (See Comments)    Leg cramps GI upset  . Salsalate Other (See Comments)  . Sulfa Antibiotics     Pt doesn't remember  . Tamoxifen Other (See Comments)    Leg cramps  . Penicillins Itching    Has patient had a PCN reaction causing immediate rash, facial/tongue/throat swelling, SOB or lightheadedness with hypotension: Yes Has patient had a PCN reaction causing severe rash involving mucus membranes or skin necrosis: No Has patient had a PCN reaction that required hospitalization: No Has patient had a PCN reaction occurring within the last 10 years: No If all of the above answers are "NO", then may proceed with Cephalosporin use.     PERTINENT MEDICATIONS:  Outpatient Encounter Medications as of 05/09/2018  Medication Sig  . acetaminophen (TYLENOL) 650 MG CR tablet Take 1,300 mg by mouth 2 (two) times daily.   Marland Kitchen amLODipine (NORVASC) 10 MG tablet Take 10 mg by mouth daily.  . Ascorbic Acid (VITAMIN C) 1000 MG tablet Take 1,000 mg by mouth daily.  Marland Kitchen aspirin EC 81 MG tablet Take 81 mg by mouth daily.   . Cholecalciferol (VITAMIN D) 125 MCG (5000 UT) CAPS Take 5,000 Units by mouth daily.   . cyanocobalamin 1000 MCG tablet Take 1,000 mcg by mouth daily.  Marland Kitchen docusate sodium (COLACE) 100 MG capsule Take 1 capsule (100 mg total) by mouth 2 (two) times daily.  . folic acid (FOLVITE) 1 MG tablet Take 1 mg by mouth daily.  Marland Kitchen ipratropium-albuterol (DUONEB) 0.5-2.5 (3) MG/3ML SOLN Take 3 mLs by nebulization every 6 (six) hours as needed.  . isosorbide mononitrate (IMDUR) 30 MG 24 hr tablet Take 30 mg by mouth daily.   Marland Kitchen losartan (COZAAR) 100 MG tablet Take 100 mg by mouth daily.   . methotrexate (RHEUMATREX) 2.5 MG tablet Take 6 tablets by mouth every Friday.   .  metoprolol (LOPRESSOR) 100 MG tablet Take 100 mg by mouth 2 (two) times daily.   . Multiple Vitamin (MULTI-VITAMINS) TABS Take 1 tablet by mouth daily.  . NON FORMULARY Diet: Regular  . sertraline (ZOLOFT) 100 MG tablet Take 100 mg by mouth daily.   No facility-administered encounter medications on file as of 05/09/2018.     PHYSICAL EXAM:  VS deferred.Wt 149.2 lb.  General: NAD, frail appearing, WNWD Pulmonary: no cough Abdomen: Denies constipation, endorse fair appetite Extremities: slight right pedal edema, h/o RA Skin: no rashes, no wounds on gross exam Neurological: Weakness , memory loss  Cyndia Skeeters DNP, AGPCNP-BC

## 2018-07-04 ENCOUNTER — Non-Acute Institutional Stay: Payer: PPO | Admitting: Primary Care

## 2018-07-04 ENCOUNTER — Other Ambulatory Visit: Payer: Self-pay

## 2018-07-04 DIAGNOSIS — Z515 Encounter for palliative care: Secondary | ICD-10-CM

## 2018-07-04 NOTE — Progress Notes (Signed)
[facility] Pall[facility] Consult Note Telephone: [phone]  Fax: [phone]   TELEHEALTH VISIT STATEMENT Due to the COVID-19 crisis, this visit was done via telemedicine from my office. It was initiated and consented to by this patient and/or family.  PATIENT NAME: [name] [name] DOB: [birth year] MRN: [id number]  PRIMARY CARE PROVIDER:  Marisa Hua, MD  REFERRING PROVIDER:  Marisa Hua, MD [place] [address]  RESPONSIBLE PARTY:   Extended Emergency Contact Information Primary Emergency Contact: [name],[name] Address: [address]          [place] [address] [place] Phone: [phone] Work Phone: [phone] Relation: Sister Secondary Emergency Contact: [place] [employer] Phone: [phone] Relation: Niece   Palliative Care was asked to follow patient by consultation request of Dr. Marisa Hua, MD  . This is a follow up visit. . ASSESSMENT and RECOMMENDATIONS:   1. Debility: Appetite improved. Wt stable at 140.6, will double check  Denies falls, uses walker independently.   2. Pain in foot:  Recommend starting 100 mg gabapentin at hs for neuropathy. States this area is tender, occasional discomfort. Is willing to try it if it does not make her too sleepy.  3. Pill burden: States she takes a lot of medicines and would like to cut down on some. I'd recommend cutting vitamins if her diet is sufficient. She also states no problems with swallowing pills.  4 .Goals of Care: DNR, comfort measures, abx, iv fluids, no tube. Moved from [place], has been settling in with new building.  T/c to POA, no answer, message left.   Palliative care will continue to follow for goals of care clarification and symptom management. Return 4-6 weeks or prn.  I spent 25 minutes providing this consultation,  from 1350 to 1415. More than 50% of the time in this consultation was spent coordinating  communication.   HISTORY OF PRESENT ILLNESS:  [name] [name] is a 85 y.o. year old female with multiple medical problems including RA, dementia, osteoporosis, OA, s/p breast CA. Palliative Care was asked to help address goals of care.   CODE STATUS: DNR, comfort measures, abx, iv fluids, no tube  PPS: 40% HOSPICE ELIGIBILITY/DIAGNOSIS: TBD  PAST MEDICAL HISTORY:  Past Medical History:  Diagnosis Date  . breast cancer   . DVT (deep venous thrombosis) ([place]) 06/2005   RLE ; treated x 3 months with Coumadin  . Fibrocystic breast disease    Severe; status post bilateral mastectomies and implants  . GERD (gastroesophageal reflux disease)    Severe; manifested vocal cord irritation, status-post Nissen fundcoplication in 09/2639  . History of ataxia    with questionable right upper and lower  . History of breast cancer   . History of exertional chest pain    with her last stress echo in 09/1999 being normal  . History of migraine    vascular  . History of shingles   . History of syncope 12/1999   With negative cardiac workup to include an echocardiogram, which revealed only mild to moderate MR, a CT scan of the chest, which was negative, and a Cardiolite stress test, which was normal  . Hyperlipidemia, unspecified   . Hypertension    CTA 05/2004 showed no renovascular cause  . Mild dementia ([place])   . Muscle atrophy    Positive FANA and anti- SCL70 antibodies; S/p biopsy without eosinic fascitis; Remicade discontinued  . Osteoarthritis    a. Cervical spine. b.  Lumbar spine/spinal stenosis. [facility])  . Osteoporosis    a. Fosamax b. Right wrist fracture. c. Sternal fracture ([place])  . Rheumatoid arthritis without elevated rheumatoid factor (HCC)    a. Methotrexate. b. S/p Remicade. c. Seronegative. d. S/p Plaquenil. [facility])    SOCIAL HX:  Social History   Tobacco Use  . Smoking status: Never Smoker  . Smokeless tobacco: Never Used  Substance Use Topics  . Alcohol use: No     ALLERGIES:  Allergies  Allergen Reactions  . Atorvastatin Other (See Comments)    Myositis  . Bisphosphonates Other (See Comments)    GI upset  . Codeine Other (See Comments)  . Memantine Other (See Comments)  . Nsaids Other (See Comments)    GI upset  . Omeprazole Nausea Only  . Other Other (See Comments)    Leg cramps GI upset  . Salsalate Other (See Comments)  . Sulfa Antibiotics     Pt doesn't remember  . Tamoxifen Other (See Comments)    Leg cramps  . Penicillins Itching    Has patient had a PCN reaction causing immediate rash, facial/tongue/throat swelling, SOB or lightheadedness with hypotension: Yes Has patient had a PCN reaction causing severe rash involving mucus membranes or skin necrosis: No Has patient had a PCN reaction that required hospitalization: No Has patient had a PCN reaction occurring within the last 10 years: No If all of the above answers are "NO", then may proceed with Cephalosporin use.     PERTINENT MEDICATIONS:  Outpatient Encounter Medications as of 07/04/2018  Medication Sig  . acetaminophen (TYLENOL) 650 MG CR tablet Take 1,300 mg by mouth 2 (two) times daily.   Marland Kitchen amLODipine (NORVASC) 10 MG tablet Take 10 mg by mouth daily.  . Ascorbic Acid (VITAMIN C) 1000 MG tablet Take 1,000 mg by mouth daily.  Marland Kitchen aspirin EC 81 MG tablet Take 81 mg by mouth daily.   . Cholecalciferol (VITAMIN D) 125 MCG (5000 UT) CAPS Take 5,000 Units by mouth daily.   . cyanocobalamin 1000 MCG tablet Take 1,000 mcg by mouth daily.  Marland Kitchen docusate sodium (COLACE) 100 MG capsule Take 1 capsule (100 mg total) by mouth 2 (two) times daily.  . folic acid (FOLVITE) 1 MG tablet Take 1 mg by mouth daily.  Marland Kitchen ipratropium-albuterol (DUONEB) 0.5-2.5 (3) MG/3ML SOLN Take 3 mLs by nebulization every 6 (six) hours as needed.  . isosorbide mononitrate (IMDUR) 30 MG 24 hr tablet Take 30 mg by mouth daily.   Marland Kitchen losartan (COZAAR) 100 MG tablet Take 100 mg by mouth daily.   . methotrexate  (RHEUMATREX) 2.5 MG tablet Take 6 tablets by mouth every Friday.   . metoprolol (LOPRESSOR) 100 MG tablet Take 100 mg by mouth 2 (two) times daily.   . Multiple Vitamin (MULTI-VITAMINS) TABS Take 1 tablet by mouth daily.  . NON FORMULARY Diet: Regular  . sertraline (ZOLOFT) 100 MG tablet Take 100 mg by mouth daily.   No facility-administered encounter medications on file as of 07/04/2018.     PHYSICAL EXAM/ ROS  General: NAD, WNWD appearing Cardiovascular:denies chest pain Pulmonary: denies cough and SOB Abdomen: endorses good appetite, denies constipation GU: denies dysuria Extremities: no edema, no joint deformities, uses walker Skin: no rashes or open wounds Neurological: Weakness, endorses LEneuropathic pain  Cyndia Skeeters DNP, AGPCNP-BC

## 2018-07-10 ENCOUNTER — Emergency Department: Payer: PPO

## 2018-07-10 ENCOUNTER — Inpatient Hospital Stay
Admission: EM | Admit: 2018-07-10 | Discharge: 2018-07-12 | DRG: 101 | Disposition: A | Discharge status: Hospice | Payer: PPO | Source: Skilled Nursing Facility | Attending: Internal Medicine | Admitting: Internal Medicine

## 2018-07-10 ENCOUNTER — Inpatient Hospital Stay: Payer: PPO

## 2018-07-10 ENCOUNTER — Other Ambulatory Visit: Payer: Self-pay

## 2018-07-10 DIAGNOSIS — S0990XA Unspecified injury of head, initial encounter: Secondary | ICD-10-CM | POA: Diagnosis not present

## 2018-07-10 DIAGNOSIS — I639 Cerebral infarction, unspecified: Secondary | ICD-10-CM | POA: Diagnosis not present

## 2018-07-10 DIAGNOSIS — W19XXXA Unspecified fall, initial encounter: Secondary | ICD-10-CM | POA: Diagnosis present

## 2018-07-10 DIAGNOSIS — D519 Vitamin B12 deficiency anemia, unspecified: Secondary | ICD-10-CM | POA: Diagnosis not present

## 2018-07-10 DIAGNOSIS — Z882 Allergy status to sulfonamides status: Secondary | ICD-10-CM

## 2018-07-10 DIAGNOSIS — E785 Hyperlipidemia, unspecified: Secondary | ICD-10-CM | POA: Diagnosis not present

## 2018-07-10 DIAGNOSIS — Z66 Do not resuscitate: Secondary | ICD-10-CM | POA: Diagnosis present

## 2018-07-10 DIAGNOSIS — D649 Anemia, unspecified: Secondary | ICD-10-CM | POA: Diagnosis not present

## 2018-07-10 DIAGNOSIS — Z90722 Acquired absence of ovaries, bilateral: Secondary | ICD-10-CM | POA: Diagnosis not present

## 2018-07-10 DIAGNOSIS — S299XXA Unspecified injury of thorax, initial encounter: Secondary | ICD-10-CM | POA: Diagnosis not present

## 2018-07-10 DIAGNOSIS — Z79899 Other long term (current) drug therapy: Secondary | ICD-10-CM

## 2018-07-10 DIAGNOSIS — M0600 Rheumatoid arthritis without rheumatoid factor, unspecified site: Secondary | ICD-10-CM | POA: Diagnosis present

## 2018-07-10 DIAGNOSIS — Z880 Allergy status to penicillin: Secondary | ICD-10-CM

## 2018-07-10 DIAGNOSIS — R569 Unspecified convulsions: Principal | ICD-10-CM

## 2018-07-10 DIAGNOSIS — F0390 Unspecified dementia without behavioral disturbance: Secondary | ICD-10-CM | POA: Diagnosis present

## 2018-07-10 DIAGNOSIS — Z853 Personal history of malignant neoplasm of breast: Secondary | ICD-10-CM

## 2018-07-10 DIAGNOSIS — E876 Hypokalemia: Secondary | ICD-10-CM | POA: Diagnosis present

## 2018-07-10 DIAGNOSIS — G40901 Epilepsy, unspecified, not intractable, with status epilepticus: Secondary | ICD-10-CM | POA: Diagnosis not present

## 2018-07-10 DIAGNOSIS — R456 Violent behavior: Secondary | ICD-10-CM | POA: Diagnosis not present

## 2018-07-10 DIAGNOSIS — Z86718 Personal history of other venous thrombosis and embolism: Secondary | ICD-10-CM | POA: Diagnosis not present

## 2018-07-10 DIAGNOSIS — F439 Reaction to severe stress, unspecified: Secondary | ICD-10-CM | POA: Diagnosis not present

## 2018-07-10 DIAGNOSIS — I10 Essential (primary) hypertension: Secondary | ICD-10-CM | POA: Diagnosis not present

## 2018-07-10 DIAGNOSIS — Z885 Allergy status to narcotic agent status: Secondary | ICD-10-CM | POA: Diagnosis not present

## 2018-07-10 DIAGNOSIS — Y92129 Unspecified place in nursing home as the place of occurrence of the external cause: Secondary | ICD-10-CM

## 2018-07-10 DIAGNOSIS — M810 Age-related osteoporosis without current pathological fracture: Secondary | ICD-10-CM | POA: Diagnosis not present

## 2018-07-10 DIAGNOSIS — Z7401 Bed confinement status: Secondary | ICD-10-CM | POA: Diagnosis not present

## 2018-07-10 DIAGNOSIS — Z8249 Family history of ischemic heart disease and other diseases of the circulatory system: Secondary | ICD-10-CM | POA: Diagnosis not present

## 2018-07-10 DIAGNOSIS — E860 Dehydration: Secondary | ICD-10-CM | POA: Diagnosis present

## 2018-07-10 DIAGNOSIS — Z888 Allergy status to other drugs, medicaments and biological substances status: Secondary | ICD-10-CM | POA: Diagnosis not present

## 2018-07-10 DIAGNOSIS — Z515 Encounter for palliative care: Secondary | ICD-10-CM

## 2018-07-10 DIAGNOSIS — Z9049 Acquired absence of other specified parts of digestive tract: Secondary | ICD-10-CM | POA: Diagnosis not present

## 2018-07-10 DIAGNOSIS — Z8619 Personal history of other infectious and parasitic diseases: Secondary | ICD-10-CM

## 2018-07-10 DIAGNOSIS — Z7189 Other specified counseling: Secondary | ICD-10-CM

## 2018-07-10 DIAGNOSIS — Z03818 Encounter for observation for suspected exposure to other biological agents ruled out: Secondary | ICD-10-CM | POA: Diagnosis not present

## 2018-07-10 DIAGNOSIS — R0902 Hypoxemia: Secondary | ICD-10-CM | POA: Diagnosis not present

## 2018-07-10 DIAGNOSIS — K219 Gastro-esophageal reflux disease without esophagitis: Secondary | ICD-10-CM | POA: Diagnosis present

## 2018-07-10 DIAGNOSIS — R404 Transient alteration of awareness: Secondary | ICD-10-CM | POA: Diagnosis not present

## 2018-07-10 DIAGNOSIS — Z993 Dependence on wheelchair: Secondary | ICD-10-CM

## 2018-07-10 DIAGNOSIS — Z9013 Acquired absence of bilateral breasts and nipples: Secondary | ICD-10-CM | POA: Diagnosis not present

## 2018-07-10 DIAGNOSIS — Z1159 Encounter for screening for other viral diseases: Secondary | ICD-10-CM

## 2018-07-10 DIAGNOSIS — E559 Vitamin D deficiency, unspecified: Secondary | ICD-10-CM | POA: Diagnosis not present

## 2018-07-10 DIAGNOSIS — R4182 Altered mental status, unspecified: Secondary | ICD-10-CM | POA: Diagnosis not present

## 2018-07-10 DIAGNOSIS — M2550 Pain in unspecified joint: Secondary | ICD-10-CM | POA: Diagnosis not present

## 2018-07-10 DIAGNOSIS — Z90710 Acquired absence of both cervix and uterus: Secondary | ICD-10-CM | POA: Diagnosis not present

## 2018-07-10 DIAGNOSIS — Z7982 Long term (current) use of aspirin: Secondary | ICD-10-CM | POA: Diagnosis not present

## 2018-07-10 DIAGNOSIS — R5381 Other malaise: Secondary | ICD-10-CM | POA: Diagnosis present

## 2018-07-10 LAB — COMPREHENSIVE METABOLIC PANEL
ALT: 30 U/L (ref 0–44)
AST: 51 U/L — ABNORMAL HIGH (ref 15–41)
Albumin: 4.4 g/dL (ref 3.5–5.0)
Alkaline Phosphatase: 78 U/L (ref 38–126)
Anion gap: 14 (ref 5–15)
BUN: 12 mg/dL (ref 8–23)
CO2: 23 mmol/L (ref 22–32)
Calcium: 9.1 mg/dL (ref 8.9–10.3)
Chloride: 102 mmol/L (ref 98–111)
Creatinine, Ser: 0.66 mg/dL (ref 0.44–1.00)
GFR calc Af Amer: >60 mL/min (ref >60)
GFR calc non Af Amer: >60 mL/min (ref >60)
Glucose, Bld: 175 mg/dL — ABNORMAL HIGH (ref 70–99)
Potassium: 3.3 mmol/L — ABNORMAL LOW (ref 3.5–5.1)
Sodium: 139 mmol/L (ref 135–145)
Total Bilirubin: 1.3 mg/dL — ABNORMAL HIGH (ref 0.3–1.2)
Total Protein: 7.8 g/dL (ref 6.5–8.1)

## 2018-07-10 LAB — CBC WITH DIFFERENTIAL/PLATELET
Abs Immature Granulocytes: 0.34 10*3/uL — ABNORMAL HIGH (ref 0.00–0.07)
Basophils Absolute: 0.1 10*3/uL (ref 0.0–0.1)
Basophils Relative: 0 %
Eosinophils Absolute: 0.1 10*3/uL (ref 0.0–0.5)
Eosinophils Relative: 1 %
HCT: 47.9 % — ABNORMAL HIGH (ref 36.0–46.0)
Hemoglobin: 16.4 g/dL — ABNORMAL HIGH (ref 12.0–15.0)
Immature Granulocytes: 2 %
Lymphocytes Relative: 4 %
Lymphs Abs: 0.8 10*3/uL (ref 0.7–4.0)
MCH: 38.1 pg — ABNORMAL HIGH (ref 26.0–34.0)
MCHC: 34.2 g/dL (ref 30.0–36.0)
MCV: 111.1 fL — ABNORMAL HIGH (ref 80.0–100.0)
Monocytes Absolute: 0.8 10*3/uL (ref 0.1–1.0)
Monocytes Relative: 4 %
Neutro Abs: 17.2 10*3/uL — ABNORMAL HIGH (ref 1.7–7.7)
Neutrophils Relative %: 89 %
Platelets: 228 10*3/uL (ref 150–400)
RBC: 4.31 MIL/uL (ref 3.87–5.11)
RDW: 14.5 % (ref 11.5–15.5)
Smear Review: NORMAL
WBC: 19.2 10*3/uL — ABNORMAL HIGH (ref 4.0–10.5)
nRBC: 0.1 % (ref 0.0–0.2)

## 2018-07-10 LAB — URINALYSIS, COMPLETE (UACMP) WITH MICROSCOPIC
Bacteria, UA: NONE SEEN
Bilirubin Urine: NEGATIVE
Glucose, UA: 50 mg/dL — AB
Ketones, ur: 5 mg/dL — AB
Leukocytes,Ua: NEGATIVE
Nitrite: NEGATIVE
Protein, ur: 100 mg/dL — AB
Specific Gravity, Urine: 1.014 (ref 1.005–1.030)
pH: 6 (ref 5.0–8.0)

## 2018-07-10 LAB — PROTIME-INR
INR: 1.1 (ref 0.8–1.2)
Prothrombin Time: 13.9 seconds (ref 11.4–15.2)

## 2018-07-10 LAB — SARS CORONAVIRUS 2 BY RT PCR (HOSPITAL ORDER, PERFORMED IN ~~LOC~~ HOSPITAL LAB): SARS Coronavirus 2: NEGATIVE

## 2018-07-10 LAB — LACTIC ACID, PLASMA
Lactic Acid, Venous: 2.6 mmol/L (ref 0.5–1.9)
Lactic Acid, Venous: 2.7 mmol/L (ref 0.5–1.9)

## 2018-07-10 LAB — APTT: aPTT: 30 seconds (ref 24–36)

## 2018-07-10 LAB — MRSA PCR SCREENING: MRSA by PCR: NEGATIVE

## 2018-07-10 MED ORDER — LORAZEPAM 2 MG/ML IJ SOLN
0.5000 mg | Freq: Once | INTRAMUSCULAR | Status: AC
  Administered 2018-07-10: 0.5 mg via INTRAVENOUS
  Filled 2018-07-10: qty 1

## 2018-07-10 MED ORDER — POTASSIUM CHLORIDE 10 MEQ/100ML IV SOLN
10.0000 meq | INTRAVENOUS | Status: DC
  Administered 2018-07-10: 16:00:00 10 meq via INTRAVENOUS
  Filled 2018-07-10: qty 100

## 2018-07-10 MED ORDER — SODIUM CHLORIDE 0.9 % IV SOLN
75.0000 mL/h | INTRAVENOUS | Status: DC
  Administered 2018-07-10 – 2018-07-12 (×3): 75 mL/h via INTRAVENOUS

## 2018-07-10 MED ORDER — POTASSIUM CHLORIDE 10 MEQ/100ML IV SOLN: 10.0000 meq | INTRAVENOUS | Status: DC

## 2018-07-10 MED ORDER — ENOXAPARIN SODIUM 40 MG/0.4ML ~~LOC~~ SOLN
40.0000 mg | SUBCUTANEOUS | Status: DC
  Administered 2018-07-10 – 2018-07-11 (×2): 40 mg via SUBCUTANEOUS
  Filled 2018-07-10 (×2): qty 0.4

## 2018-07-10 MED ORDER — ORAL CARE MOUTH RINSE
15.0000 mL | Freq: Two times a day (BID) | OROMUCOSAL | Status: DC
  Administered 2018-07-10 – 2018-07-12 (×4): 15 mL via OROMUCOSAL

## 2018-07-10 MED ORDER — HYDRALAZINE HCL 20 MG/ML IJ SOLN
10.0000 mg | INTRAMUSCULAR | Status: DC | PRN
  Administered 2018-07-11: 10 mg via INTRAVENOUS
  Filled 2018-07-10: qty 1

## 2018-07-10 MED ORDER — LEVETIRACETAM IN NACL 500 MG/100ML IV SOLN
500.0000 mg | Freq: Two times a day (BID) | INTRAVENOUS | Status: DC
  Administered 2018-07-10 – 2018-07-11 (×2): 500 mg via INTRAVENOUS
  Filled 2018-07-10 (×4): qty 100

## 2018-07-10 MED ORDER — POTASSIUM CHLORIDE 10 MEQ/100ML IV SOLN
10.0000 meq | INTRAVENOUS | Status: AC
  Administered 2018-07-10 – 2018-07-11 (×3): 10 meq via INTRAVENOUS
  Filled 2018-07-10: qty 100

## 2018-07-10 NOTE — H&P (Signed)
[place] [facility] [place] NAME: [name] [name]    MR#:  [id number]  DATE OF BIRTH:  [birth year]  DATE OF ADMISSION:  07/10/2018  PRIMARY CARE PHYSICIAN: Marisa Hua, MD   REQUESTING/REFERRING PHYSICIAN: Lavonia Drafts, MD  CHIEF COMPLAINT:   Chief Complaint  Patient presents with  . Seizures  . Fall    HISTORY OF PRESENT ILLNESS:  [name] [name]  is a 85 y.o. female with a known history of end stage dementia lives at [facility] for over a month now since [facility] transferred their patients out (was long term resident at [facility] for over a yr) with palliative care following as an outpt. Last seen normal this AM and apparently because agitated/confused and suspected seizure activity with eyes rolling to left side was brought to ED. EMS reported active seizure and given Versed. has elevated lactate and seems post ictal. PAST MEDICAL HISTORY:   Past Medical History:  Diagnosis Date  . breast cancer   . DVT (deep venous thrombosis) ([place]) 06/2005   RLE ; treated x 3 months with Coumadin  . Fibrocystic breast disease    Severe; status post bilateral mastectomies and implants  . GERD (gastroesophageal reflux disease)    Severe; manifested vocal cord irritation, status-post Nissen fundcoplication in 09/996  . History of ataxia    with questionable right upper and lower  . History of breast cancer   . History of exertional chest pain    with her last stress echo in 09/1999 being normal  . History of migraine    vascular  . History of shingles   . History of syncope 12/1999   With negative cardiac workup to include an echocardiogram, which revealed only mild to moderate MR, a CT scan of the chest, which was negative, and a Cardiolite stress test, which was normal  . Hyperlipidemia, unspecified   . Hypertension    CTA 05/2004 showed no renovascular cause  . Mild dementia ([place])   . Muscle atrophy    Positive FANA and anti- SCL70  antibodies; S/p biopsy without eosinic fascitis; Remicade discontinued  . Osteoarthritis    a. Cervical spine. b. Lumbar spine/spinal stenosis. [facility])  . Osteoporosis    a. Fosamax b. Right wrist fracture. c. Sternal fracture ([place])  . Rheumatoid arthritis without elevated rheumatoid factor (HCC)    a. Methotrexate. b. S/p Remicade. c. Seronegative. d. S/p Plaquenil. [facility])    PAST SURGICAL HISTORY:   Past Surgical History:  Procedure Laterality Date  . APPENDECTOMY    . CHOLECYSTECTOMY    . IRRIGATION AND DEBRIDEMENT ABSCESS Right 04/08/2017   Procedure: IRRIGATION AND DEBRIDEMENT ABSCESS with wound vac;  Surgeon: Albertine Patricia, DPM;  Location: [facility] ORS;  Service: Podiatry;  Laterality: Right;  . LAPAROSCOPIC NISSEN FUNDOPLICATION  33/8250  . LUMBAR LAMINECTOMY     for spinal stenosis  . MASTECTOMY Bilateral    with implants  . ORIF DISTAL RADIUS FRACTURE Right 07/27/2008   with volar plate  . TOTAL ABDOMINAL HYSTERECTOMY W/ BILATERAL SALPINGOOPHORECTOMY    . VASCULAR SURGERY      SOCIAL HISTORY:   Social History   Tobacco Use  . Smoking status: Never Smoker  . Smokeless tobacco: Never Used  Substance Use Topics  . Alcohol use: No    FAMILY HISTORY:   Family History  Problem Relation Age of Onset  . Heart failure Mother   . Hypertension Mother   . Cancer Father   . Heart attack Sister  DRUG ALLERGIES:   Allergies  Allergen Reactions  . Atorvastatin Other (See Comments)    Myositis  . Bisphosphonates Other (See Comments)    GI upset  . Codeine Other (See Comments)  . Memantine Other (See Comments)  . Nsaids Other (See Comments)    GI upset  . Omeprazole Nausea Only  . Other Other (See Comments)    Leg cramps GI upset  . Salsalate Other (See Comments)  . Sulfa Antibiotics     Pt doesn't remember  . Tamoxifen Other (See Comments)    Leg cramps  . Penicillins Itching    Has patient had a PCN reaction causing immediate rash, facial/tongue/throat  swelling, SOB or lightheadedness with hypotension: Yes Has patient had a PCN reaction causing severe rash involving mucus membranes or skin necrosis: No Has patient had a PCN reaction that required hospitalization: No Has patient had a PCN reaction occurring within the last 10 years: No If all of the above answers are "NO", then may proceed with Cephalosporin use.    REVIEW OF SYSTEMS:   ROS: unable to obtain due to mental status  MEDICATIONS AT HOME:   Prior to Admission medications   Medication Sig Start Date End Date Taking? Authorizing Provider  acetaminophen (TYLENOL) 650 MG CR tablet Take 1,300 mg by mouth 2 (two) times daily.  04/10/17  Yes [provider]  amLODipine (NORVASC) 10 MG tablet Take 10 mg by mouth daily.   Yes [provider]  Ascorbic Acid (VITAMIN C) 1000 MG tablet Take 1,000 mg by mouth daily.   Yes [provider]  aspirin EC 81 MG tablet Take 81 mg by mouth daily.    Yes [provider]  Cholecalciferol (VITAMIN D) 125 MCG (5000 UT) CAPS Take 5,000 Units by mouth daily.  01/04/18  Yes [provider]  cyanocobalamin 1000 MCG tablet Take 1,000 mcg by mouth daily.   Yes [provider]  docusate sodium (COLACE) 100 MG capsule Take 1 capsule (100 mg total) by mouth 2 (two) times daily. 04/10/17  Yes Salary, Avel Peace, MD  folic acid (FOLVITE) 1 MG tablet Take 1 mg by mouth daily.   Yes [provider]  isosorbide mononitrate (IMDUR) 30 MG 24 hr tablet Take 30 mg by mouth daily.    Yes [provider]  losartan (COZAAR) 100 MG tablet Take 100 mg by mouth daily.    Yes [provider]  methotrexate (RHEUMATREX) 2.5 MG tablet Take 12.5 tablets by mouth every Friday.  10/28/15  Yes [provider]  metoprolol (LOPRESSOR) 100 MG tablet Take 100 mg by mouth 2 (two) times daily.    Yes [provider]  Multiple Vitamin (MULTI-VITAMINS) TABS Take 1 tablet by mouth daily.   Yes  [provider]  sertraline (ZOLOFT) 100 MG tablet Take 100 mg by mouth daily.   Yes [provider]      VITAL SIGNS:  Blood pressure (!) 199/85, pulse 99, temperature (!) 96.8 F (36 C), temperature source Axillary, resp. rate 20, height 5\' 5"  (1.651 m), weight 65 kg, SpO2 94 %.  PHYSICAL EXAMINATION:  Physical Exam  GENERAL:  85 y.o.-year-old patient lying in the bed with no acute distress.  EYES: Pupils equal, round, reactive to light and accommodation. No scleral icterus. Extraocular muscles intact.  HEENT: Head atraumatic, normocephalic. Oropharynx and nasopharynx clear.  NECK:  Supple, no jugular venous distention. No thyroid enlargement, no tenderness.  LUNGS: Normal breath sounds bilaterally, no wheezing, rales,rhonchi  or crepitation. No use of accessory muscles of respiration.  CARDIOVASCULAR: S1, S2 normal. No murmurs, rubs, or gallops.  ABDOMEN: Soft, nontender, nondistended. Bowel sounds present. No organomegaly or mass.  EXTREMITIES: No pedal edema, cyanosis, or clubbing.  NEUROLOGIC: Pt does withdraw from painful stimuli bilaterally. Left gaze deviation +, patient unresponsive to voice/verbal commands  PSYCHIATRIC: The patient is post-ictal so difficult to evaluate SKIN: No obvious rash, lesion, or ulcer.  LABORATORY PANEL:   CBC Recent Labs  Lab 07/10/18 0743  WBC 19.2*  HGB 16.4*  HCT 47.9*  PLT 228   ------------------------------------------------------------------------------------------------------------------  Chemistries  Recent Labs  Lab 07/10/18 0743  NA 139  K 3.3*  CL 102  CO2 23  GLUCOSE 175*  BUN 12  CREATININE 0.66  CALCIUM 9.1  AST 51*  ALT 30  ALKPHOS 78  BILITOT 1.3*   ------------------------------------------------------------------------------------------------------------------  Cardiac Enzymes No results for input(s): TROPONINI in the last 168 hours.  ------------------------------------------------------------------------------------------------------------------  RADIOLOGY:  Ct Head Wo Contrast  Result Date: 07/10/2018 CLINICAL DATA:  Seizure following fall EXAM: CT HEAD WITHOUT CONTRAST TECHNIQUE: Contiguous axial images were obtained from the base of the skull through the vertex without intravenous contrast. COMPARISON:  March 18, 2017 FINDINGS: Brain: Mild to moderate diffuse atrophy is stable. There is no intracranial mass, hemorrhage, extra-axial fluid collection, or midline shift. There is small vessel disease throughout the centra semiovale bilaterally. Small vessel disease is noted in the anterior limbs of each internal and external capsule, stable. There is a small infarct in the posterior inferior to midportion of the right cerebellum, stable. No acute infarct is evident. Vascular: There is no hyperdense vessel. There is calcification in each distal vertebral artery as well as in each carotid siphon region. Skull: The bony calvarium appears intact. Sinuses/Orbits: There is mucosal thickening in several ethmoid air cells. Other visualized paranasal sinuses are clear. Visualized orbits appear symmetric bilaterally. Other: Visualized mastoid air cells are clear. IMPRESSION: Stable atrophy with supratentorial small vessel disease. 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 No acute infarct evident. No mass or hemorrhage. There are foci of arterial vascular calcification. There is mucosal thickening in several ethmoid air cells. Electronically Signed   By: Lowella Grip III M.D.   On: 07/10/2018 08:32   Dg Chest Port 1 View  Result Date: 07/10/2018 CLINICAL DATA:  Altered mental status.  Recent fall. EXAM: PORTABLE CHEST 1 VIEW COMPARISON:  April 09, 2017 FINDINGS: There is no edema or consolidation. Heart is mildly enlarged with pulmonary vascularity normal. No adenopathy. There is aortic atherosclerosis. No pneumothorax. No  acute fracture evident. There is an old healed fracture of the lateral left sixth and seventh ribs. Bones are osteoporotic. IMPRESSION: No edema or consolidation. Stable cardiac prominence. Bones osteoporotic. Aortic Atherosclerosis (ICD10-I70.0). Electronically Signed   By: Lowella Grip III M.D.   On: 07/10/2018 08:57   IMPRESSION AND PLAN:  28 y f with suspected seizure  * Suspected seizure: can't r/o underlying stroke - getting MRI brain, EEG. - neuro c/s - Dr Irish Elders already seen her in ED. - continue IV Keppra for now per Neuro  * Hypokalemia: replete and recheck  * Uncontrolled HTN: r/o CVA first. Resume home BP meds and monitor. Currently on hold as she is not able to take PO  * Debility: overall poor prognosis  Consider Hospice based on stroke work-up and clinical progress.   All the records are reviewed and case discussed with ED provider. Management plans discussed with the  patient, family (d/w daughter [name],[name] over phone) and they are in agreement.  CODE STATUS: DNR  TOTAL TIME TAKING CARE OF THIS PATIENT: 45 minutes.    Max Sane M.D on 07/10/2018 at 1:00 PM  Between 7am to 6pm - Pager - [phone]   After 6pm go to [email or web address] - Proofreader  [employer] [facility]  Office  [phone]  CC: Primary care physician; Marisa Hua, MD   Note: This dictation was prepared with Dragon dictation along with smaller phrase technology. Any transcriptional errors that result from this process are unintentional.

## 2018-07-10 NOTE — Consult Note (Signed)
PHARMACY CONSULT NOTE - FOLLOW UP  Pharmacy Consult for Electrolyte Monitoring and Replacement   Recent Labs: Potassium (mmol/L)  Date Value  07/10/2018 3.3 (L)  10/01/2013 3.8   Magnesium (mg/dL)  Date Value  02/25/2018 2.2  05/04/2012 1.5 (L)   Calcium (mg/dL)  Date Value  07/10/2018 9.1   Calcium, Total (mg/dL)  Date Value  10/01/2013 8.6   Albumin (g/dL)  Date Value  07/10/2018 4.4  05/04/2012 2.7 (L)   Sodium (mmol/L)  Date Value  07/10/2018 139  10/01/2013 138     Assessment: Patient is hypokalemic and NPO, with K = 3.3  Goal of Therapy:  K wnl's  Plan:  Will give KCL 62meq IV x 4 and recheck K with am labs  [name] [name] ,PharmD Clinical Pharmacist 07/10/2018 2:48 PM

## 2018-07-10 NOTE — Consult Note (Signed)
MEDICATION RELATED CONSULT NOTE - FOLLOW UP   Pharmacy Consult for Drug interaction and monitoring of antiepileptic medications Indication: New start Keppra  Allergies  Allergen Reactions  . Atorvastatin Other (See Comments)    Myositis  . Bisphosphonates Other (See Comments)    GI upset  . Codeine Other (See Comments)  . Memantine Other (See Comments)  . Nsaids Other (See Comments)    GI upset  . Omeprazole Nausea Only  . Other Other (See Comments)    Leg cramps GI upset  . Salsalate Other (See Comments)  . Sulfa Antibiotics     Pt doesn't remember  . Tamoxifen Other (See Comments)    Leg cramps  . Penicillins Itching    Has patient had a PCN reaction causing immediate rash, facial/tongue/throat swelling, SOB or lightheadedness with hypotension: Yes Has patient had a PCN reaction causing severe rash involving mucus membranes or skin necrosis: No Has patient had a PCN reaction that required hospitalization: No Has patient had a PCN reaction occurring within the last 10 years: No If all of the above answers are "NO", then may proceed with Cephalosporin use.    Patient Measurements: Height: 5\' 5"  (165.1 cm) Weight: 143 lb 4.8 oz (65 kg) IBW/kg (Calculated) : 57  Vital Signs: Temp: 99.3 F (37.4 C) (05/13 1305) Temp Source: Axillary (05/13 1305) BP: 177/81 (05/13 1305) Pulse Rate: 99 (05/13 1305) Intake/Output from previous day: No intake/output data recorded. Intake/Output from this shift: Total I/O In: 103.7 [I.V.:46.5; IV Piggyback:57.2] Out: -   Labs: Recent Labs    07/10/18 0743  WBC 19.2*  HGB 16.4*  HCT 47.9*  PLT 228  APTT 30  CREATININE 0.66  ALBUMIN 4.4  PROT 7.8  AST 51*  ALT 30  ALKPHOS 78  BILITOT 1.3*   Estimated Creatinine Clearance: 45.4 mL/min (by C-G formula based on SCr of 0.66 mg/dL).   Microbiology: Recent Results (from the past 720 hour(s))  SARS Coronavirus 2 (CEPHEID- Performed in [place] hospital lab), Hosp Order      Status: None   Collection Time: 07/10/18  7:43 AM  Result Value Ref Range Status   SARS Coronavirus 2 NEGATIVE NEGATIVE Final    Comment: (NOTE) If result is NEGATIVE SARS-CoV-2 target nucleic acids are NOT DETECTED. The SARS-CoV-2 RNA is generally detectable in upper and lower  respiratory specimens during the acute phase of infection. The lowest  concentration of SARS-CoV-2 viral copies this assay can detect is 250  copies / mL. A negative result does not preclude SARS-CoV-2 infection  and should not be used as the sole basis for treatment or other  patient management decisions.  A negative result may occur with  improper specimen collection / handling, submission of specimen other  than nasopharyngeal swab, presence of viral mutation(s) within the  areas targeted by this assay, and inadequate number of viral copies  (<250 copies / mL). A negative result must be combined with clinical  observations, patient history, and epidemiological information. If result is POSITIVE SARS-CoV-2 target nucleic acids are DETECTED. The SARS-CoV-2 RNA is generally detectable in upper and lower  respiratory specimens dur ing the acute phase of infection.  Positive  results are indicative of active infection with SARS-CoV-2.  Clinical  correlation with patient history and other diagnostic information is  necessary to determine patient infection status.  Positive results do  not rule out bacterial infection or co-infection with other viruses. If result is PRESUMPTIVE POSTIVE SARS-CoV-2 nucleic acids MAY BE PRESENT.  A presumptive positive result was obtained on the submitted specimen  and confirmed on repeat testing.  While 2019 novel coronavirus  (SARS-CoV-2) nucleic acids may be present in the submitted sample  additional confirmatory testing may be necessary for epidemiological  and / or clinical management purposes  to differentiate between  SARS-CoV-2 and other Sarbecovirus currently known to  infect humans.  If clinically indicated additional testing with an alternate test  methodology [phone]) is advised. The SARS-CoV-2 RNA is generally  detectable in upper and lower respiratory sp ecimens during the acute  phase of infection. The expected result is Negative. Fact Sheet for Patients:  StrictlyIdeas.no Fact Sheet for Healthcare Providers: BankingDealers.co.za This test is not yet approved or cleared by the [place] FDA and has been authorized for detection and/or diagnosis of SARS-CoV-2 by FDA under an Emergency Use Authorization (EUA).  This EUA will remain in effect (meaning this test can be used) for the duration of the COVID-19 declaration under Section 564(b)(1) of the Act, 21 U.S.C. section 360bbb-3(b)(1), unless the authorization is terminated or revoked sooner. Performed at [facility], [address] [place], [place] [address]     Medications:  Multiple pta meds held due to pt NPO (unconsious) status  Assessment: No interactions of note at this time - will continue to monitor as pt resumes any home meds  Plan:  Will monitor potential drug interactions with Keppra daily per protocol  Lu Duffel, PharmD, BCPS Clinical Pharmacist 07/10/2018 2:02 PM

## 2018-07-10 NOTE — Consult Note (Signed)
Reason for Consult: seizure vs stroke Referring Physician: Dr. Manuella Ghazi  CC: sz vs stroke  HPI: [name] [name] is an 85 y.o. female with hx of end stage dementia with "MOST" form lives in NH with palliative care following. Last seen normal this AM and apparently because agitated/confused and suspected seizure activity with eyes rolling to left side. On admission elevated lactate.  Currently eyes midline but does not follow commands.    Past Medical History:  Diagnosis Date  . breast cancer   . DVT (deep venous thrombosis) ([place]) 06/2005   RLE ; treated x 3 months with Coumadin  . Fibrocystic breast disease    Severe; status post bilateral mastectomies and implants  . GERD (gastroesophageal reflux disease)    Severe; manifested vocal cord irritation, status-post Nissen fundcoplication in 06/4096  . History of ataxia    with questionable right upper and lower  . History of breast cancer   . History of exertional chest pain    with her last stress echo in 09/1999 being normal  . History of migraine    vascular  . History of shingles   . History of syncope 12/1999   With negative cardiac workup to include an echocardiogram, which revealed only mild to moderate MR, a CT scan of the chest, which was negative, and a Cardiolite stress test, which was normal  . Hyperlipidemia, unspecified   . Hypertension    CTA 05/2004 showed no renovascular cause  . Mild dementia ([place])   . Muscle atrophy    Positive FANA and anti- SCL70 antibodies; S/p biopsy without eosinic fascitis; Remicade discontinued  . Osteoarthritis    a. Cervical spine. b. Lumbar spine/spinal stenosis. [facility])  . Osteoporosis    a. Fosamax b. Right wrist fracture. c. Sternal fracture ([place])  . Rheumatoid arthritis without elevated rheumatoid factor (H[facility])    a. Methotrexate. b. S/p Remicade. c. Seronegative. d. S/p Plaquenil. [facility])    Past Surgical History:  Procedure Laterality Date  . APPENDECTOMY    . CHOLECYSTECTOMY    .  IRRIGATION AND DEBRIDEMENT ABSCESS Right 04/08/2017   Procedure: IRRIGATION AND DEBRIDEMENT ABSCESS with wound vac;  Surgeon: Albertine Patricia, DPM;  Location: [facility] ORS;  Service: Podiatry;  Laterality: Right;  . LAPAROSCOPIC NISSEN FUNDOPLICATION  12/9145  . LUMBAR LAMINECTOMY     for spinal stenosis  . MASTECTOMY Bilateral    with implants  . ORIF DISTAL RADIUS FRACTURE Right 07/27/2008   with volar plate  . TOTAL ABDOMINAL HYSTERECTOMY W/ BILATERAL SALPINGOOPHORECTOMY    . VASCULAR SURGERY      Family History  Problem Relation Age of Onset  . Heart failure Mother   . Hypertension Mother   . Cancer Father   . Heart attack Sister     Social History:  reports that she has never smoked. She has never used smokeless tobacco. She reports that she does not drink alcohol or use drugs.  Allergies  Allergen Reactions  . Atorvastatin Other (See Comments)    Myositis  . Bisphosphonates Other (See Comments)    GI upset  . Codeine Other (See Comments)  . Memantine Other (See Comments)  . Nsaids Other (See Comments)    GI upset  . Omeprazole Nausea Only  . Other Other (See Comments)    Leg cramps GI upset  . Salsalate Other (See Comments)  . Sulfa Antibiotics     Pt doesn't remember  . Tamoxifen Other (See Comments)    Leg cramps  . Penicillins  Itching    Has patient had a PCN reaction causing immediate rash, facial/tongue/throat swelling, SOB or lightheadedness with hypotension: Yes Has patient had a PCN reaction causing severe rash involving mucus membranes or skin necrosis: No Has patient had a PCN reaction that required hospitalization: No Has patient had a PCN reaction occurring within the last 10 years: No If all of the above answers are "NO", then may proceed with Cephalosporin use.    Medications: I have reviewed the patient's current medications.  ROS: Unable to obtain as not following commands   Physical Examination: Blood pressure (!) 207/86, pulse 88,  temperature (!) 96.8 F (36 C), temperature source Axillary, resp. rate 18, height 5\' 5"  (1.651 m), weight 65 kg, SpO2 100 %.  Pt does withdraw from painful stimuli bilaterally No signs of forced eye deviation Pupils and corneal reactive Cough not tested.   Laboratory Studies:   Basic Metabolic Panel: Recent Labs  Lab 07/10/18 0743  NA 139  K 3.3*  CL 102  CO2 23  GLUCOSE 175*  BUN 12  CREATININE 0.66  CALCIUM 9.1    Liver Function Tests: Recent Labs  Lab 07/10/18 0743  AST 51*  ALT 30  ALKPHOS 78  BILITOT 1.3*  PROT 7.8  ALBUMIN 4.4   No results for input(s): LIPASE, AMYLASE in the last 168 hours. No results for input(s): AMMONIA in the last 168 hours.  CBC: Recent Labs  Lab 07/10/18 0743  WBC 19.2*  NEUTROABS 17.2*  HGB 16.4*  HCT 47.9*  MCV 111.1*  PLT 228    Cardiac Enzymes: No results for input(s): CKTOTAL, CKMB, CKMBINDEX, TROPONINI in the last 168 hours.  BNP: Invalid input(s): POCBNP  CBG: No results for input(s): GLUCAP in the last 168 hours.  Microbiology: Results for orders placed or performed during the hospital encounter of 07/10/18  SARS Coronavirus 2 (CEPHEID- Performed in [place] hospital lab), Hosp Order     Status: None   Collection Time: 07/10/18  7:43 AM  Result Value Ref Range Status   SARS Coronavirus 2 NEGATIVE NEGATIVE Final    Comment: (NOTE) If result is NEGATIVE SARS-CoV-2 target nucleic acids are NOT DETECTED. The SARS-CoV-2 RNA is generally detectable in upper and lower  respiratory specimens during the acute phase of infection. The lowest  concentration of SARS-CoV-2 viral copies this assay can detect is 250  copies / mL. A negative result does not preclude SARS-CoV-2 infection  and should not be used as the sole basis for treatment or other  patient management decisions.  A negative result may occur with  improper specimen collection / handling, submission of specimen other  than nasopharyngeal swab, presence  of viral mutation(s) within the  areas targeted by this assay, and inadequate number of viral copies  (<250 copies / mL). A negative result must be combined with clinical  observations, patient history, and epidemiological information. If result is POSITIVE SARS-CoV-2 target nucleic acids are DETECTED. The SARS-CoV-2 RNA is generally detectable in upper and lower  respiratory specimens dur ing the acute phase of infection.  Positive  results are indicative of active infection with SARS-CoV-2.  Clinical  correlation with patient history and other diagnostic information is  necessary to determine patient infection status.  Positive results do  not rule out bacterial infection or co-infection with other viruses. If result is PRESUMPTIVE POSTIVE SARS-CoV-2 nucleic acids MAY BE PRESENT.   A presumptive positive result was obtained on the submitted specimen  and confirmed on repeat testing.  While 2019 novel coronavirus  (SARS-CoV-2) nucleic acids may be present in the submitted sample  additional confirmatory testing may be necessary for epidemiological  and / or clinical management purposes  to differentiate between  SARS-CoV-2 and other Sarbecovirus currently known to infect humans.  If clinically indicated additional testing with an alternate test  methodology [phone]) is advised. The SARS-CoV-2 RNA is generally  detectable in upper and lower respiratory sp ecimens during the acute  phase of infection. The expected result is Negative. Fact Sheet for Patients:  StrictlyIdeas.no Fact Sheet for Healthcare Providers: BankingDealers.co.za This test is not yet approved or cleared by the [place] FDA and has been authorized for detection and/or diagnosis of SARS-CoV-2 by FDA under an Emergency Use Authorization (EUA).  This EUA will remain in effect (meaning this test can be used) for the duration of the COVID-19 declaration under Section  564(b)(1) of the Act, 21 U.S.C. section 360bbb-3(b)(1), unless the authorization is terminated or revoked sooner. Performed at [facility], [address] [place], [place] [address]     Coagulation Studies: Recent Labs    07/10/18 0743  LABPROT 13.9  INR 1.1    Urinalysis:  Recent Labs  Lab 07/10/18 0743  COLORURINE YELLOW*  LABSPEC 1.014  PHURINE 6.0  GLUCOSEU 50*  HGBUR LARGE*  BILIRUBINUR NEGATIVE  KETONESUR 5*  PROTEINUR 100*  NITRITE NEGATIVE  LEUKOCYTESUR NEGATIVE    Lipid Panel:  No results found for: CHOL, TRIG, HDL, CHOLHDL, VLDL, LDLCALC  HgbA1C: No results found for: HGBA1C  Urine Drug Screen:  No results found for: LABOPIA, COCAINSCRNUR, LABBENZ, AMPHETMU, THCU, LABBARB  Alcohol Level: No results for input(s): ETH in the last 168 hours.  Imaging: Ct Head Wo Contrast  Result Date: 07/10/2018 CLINICAL DATA:  Seizure following fall EXAM: CT HEAD WITHOUT CONTRAST TECHNIQUE: Contiguous axial images were obtained from the base of the skull through the vertex without intravenous contrast. COMPARISON:  March 18, 2017 FINDINGS: Brain: Mild to moderate diffuse atrophy is stable. There is no intracranial mass, hemorrhage, extra-axial fluid collection, or midline shift. There is small vessel disease throughout the centra semiovale bilaterally. Small vessel disease is noted in the anterior limbs of each internal and external capsule, stable. There is a small infarct in the posterior inferior to midportion of the right cerebellum, stable. No acute infarct is evident. Vascular: There is no hyperdense vessel. There is calcification in each distal vertebral artery as well as in each carotid siphon region. Skull: The bony calvarium appears intact. Sinuses/Orbits: There is mucosal thickening in several ethmoid air cells. Other visualized paranasal sinuses are clear. Visualized orbits appear symmetric bilaterally. Other: Visualized mastoid air cells are clear.  IMPRESSION: Stable atrophy with supratentorial small vessel disease. 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 No acute infarct evident. No mass or hemorrhage. There are foci of arterial vascular calcification. There is mucosal thickening in several ethmoid air cells. Electronically Signed   By: Lowella Grip III M.D.   On: 07/10/2018 08:32   Dg Chest Port 1 View  Result Date: 07/10/2018 CLINICAL DATA:  Altered mental status.  Recent fall. EXAM: PORTABLE CHEST 1 VIEW COMPARISON:  April 09, 2017 FINDINGS: There is no edema or consolidation. Heart is mildly enlarged with pulmonary vascularity normal. No adenopathy. There is aortic atherosclerosis. No pneumothorax. No acute fracture evident. There is an old healed fracture of the lateral left sixth and seventh ribs. Bones are osteoporotic. IMPRESSION: No edema or consolidation. Stable cardiac prominence. Bones osteoporotic.  Aortic Atherosclerosis (ICD10-I70.0). Electronically Signed   By: Lowella Grip III M.D.   On: 07/10/2018 08:57     Assessment/Plan:  85 y.o. female with hx of end stage dementia with "MOST" form lives in NH with palliative care following. Last seen normal this AM and apparently because agitated/confused and suspected seizure activity with eyes rolling to left side. On admission elevated lactate.  Currently eyes midline but does not follow commands.   - CTH with significant atrophy consistent with age and dementia - EEG ordered. Pt started on Keppra 500 BID - MRI pending - Based on above studies family discussions on aggression of care.  - Appreciate palliative care following - Pt is DNR 07/10/2018, 10:19 AM

## 2018-07-10 NOTE — ED Notes (Signed)
Patient transported to CT 

## 2018-07-10 NOTE — ED Notes (Signed)
This tech went into room to recheck pts blood pressure. Pt had voided. This tech, Linus Orn EDT and Marya Amsler RN cleaned pt up and replaced all bedding/incontinence pads.

## 2018-07-10 NOTE — Consult Note (Signed)
Consultation Note Date: 07/10/2018   Patient Name: [name] [name]  DOB: Jun 17, 1930  MRN: [id number]  Age / Sex: 86 y.o., female  PCP: Marisa Hua, MD Referring Physician: Max Sane, MD  Reason for Consultation: Establishing goals of care  HPI/Patient Profile: 85 y.o. female  with past medical history of remote breast cancer, broken ankle with abscess, syncope, HTN, mild dementia, osteoarthritis, RA  admitted on 07/10/2018 after a fall at her nursing home and possible seizure activity. Per report she became unresponsive with EMS and then became having seizures. She was hypoxic on arrival at 84%, but has since recovered, EKG showed diffuse ST depression inferiorly and laterally - suggestive of posterior ischemia. Lactic acid elevated, CBC with WBC extremely high at 19.2 and ANC at 17.2, urine pos for glucose, ketones and protein; chest xray clear. Patient has a MOST form in place which indicates comfort measures, IV fluids and antibiotics if indicated. Neuro consulted- CT ordered showing no acute process, does show significant atrophy- EEG and MRI pending. Palliative medicine consulted for [name].    Clinical Assessment and Goals of Care: Patient in bed- she does not arouse to my voice or touch.  I spoke with [name] who is patient's niece. She is the daughter of patient's HCPOA- [name]. [name] has had surgery recently for cancer and is recovering. [name] has been active in [name] and is listed as an emergency contact. [name] was involved in helping to complete patient's MOST form. Life review was conducted- patient is one of six children- patient and sister [name] are only surviving. She was married for 27 years and is now widowed. She had one daughter who died just before her husband died. She was living independently until about a year and a half ago when she had a fall and broke her ankle. At  that time she went into rehab, then transitioned into long term care.  Functionally- she is wheelchair bound- but she is able to transfer herself from her bed to chair- she has been in the wheelchair for about a year. She feeds herself, and is able to communicate with family members using a cell phone. Per outpat Palliative note she was playing Bingo prior to nursing home lockdown. Family had noticed her mental status had decreased since lockdown with some increased confusion- which they attributed to the social isolation- but she was still able to interact, hold conversations and remember previous conversations- for example she asked about her sister's surgery.  I reviewed patient's GOC and MOST form with [name]. [name] was in agreement with patient's stated GOC. She stated that patient had stated multiple times that she was ready for her EOL when her time came. [name] notes that it is most important that patient is not in pain. She states that patient would not want her life to be prolonged if it is determined she was going to be left in a debilitated state, bedridden, unable to return to her baseline.  For now- [name] requests to honor patient's MOST form- trial  IV fluids and antibiotics for 24 hours to see patient's trajectory and monitor for improvement. If patient does not improve- then transition to comfort care only. We reviewed that comfort care only would include no IV fluids, antibiotics, or other medications or procedures intended to prolong life. However, care would be provided to ensure patient was not suffering or in pain and support patient through natural dying process.   Primary Decision Maker HCPOA- [name]- and patient's emergency contact- [name]    SUMMARY OF RECOMMENDATIONS -Continue IV fluids, antibiotics -Revaluate tomorrow for progression/decline, with consideration for transition to full comfort and hospice     Code Status/Advance Care Planning:  DNR  Prognosis:     Unable to determine  Discharge Planning: To Be Determined  Primary Diagnoses: Present on Admission: **None**   I have reviewed the medical record, interviewed the patient and family, and examined the patient. The following aspects are pertinent.  Past Medical History:  Diagnosis Date   breast cancer    DVT (deep venous thrombosis) ([place]) 06/2005   RLE ; treated x 3 months with Coumadin   Fibrocystic breast disease    Severe; status post bilateral mastectomies and implants   GERD (gastroesophageal reflux disease)    Severe; manifested vocal cord irritation, status-post Nissen fundcoplication in 0/8657   History of ataxia    with questionable right upper and lower   History of breast cancer    History of exertional chest pain    with her last stress echo in 09/1999 being normal   History of migraine    vascular   History of shingles    History of syncope 12/1999   With negative cardiac workup to include an echocardiogram, which revealed only mild to moderate MR, a CT scan of the chest, which was negative, and a Cardiolite stress test, which was normal   Hyperlipidemia, unspecified    Hypertension    CTA 05/2004 showed no renovascular cause   Mild dementia (HCC)    Muscle atrophy    Positive FANA and anti- SCL70 antibodies; S/p biopsy without eosinic fascitis; Remicade discontinued   Osteoarthritis    a. Cervical spine. b. Lumbar spine/spinal stenosis. ([place])   Osteoporosis    a. Fosamax b. Right wrist fracture. c. Sternal fracture ([place])   Rheumatoid arthritis without elevated rheumatoid factor ([place])    a. Methotrexate. b. S/p Remicade. c. Seronegative. d. S/p Plaquenil. Lavonne Chick)   Social History   Socioeconomic History   Marital status: Widowed    Spouse name: Not on file   Number of children: 0   Years of education: 12   Highest education level: High school graduate  Occupational History   Not on file  Social Needs   Financial resource strain:  Not on file   Food insecurity:    Worry: Not on file    Inability: Not on file   Transportation needs:    Medical: Not on file    Non-medical: Not on file  Tobacco Use   Smoking status: Never Smoker   Smokeless tobacco: Never Used  Substance and Sexual Activity   Alcohol use: No   Drug use: No   Sexual activity: Not Currently  Lifestyle   Physical activity:    Days per week: Not on file    Minutes per session: Not on file   Stress: Not on file  Relationships   Social connections:    Talks on phone: Not on file    Gets together: Not on file  Attends religious service: Not on file    Active member of club or organization: Not on file    Attends meetings of clubs or organizations: Not on file    Relationship status: Not on file  Other Topics Concern   Not on file  Social History Narrative   Not on file   Family History  Problem Relation Age of Onset   Heart failure Mother    Hypertension Mother    Cancer Father    Heart attack Sister    Scheduled Meds:  enoxaparin (LOVENOX) injection  40 mg Subcutaneous Q24H   mouth rinse  15 mL Mouth Rinse BID   Continuous Infusions:  sodium chloride 75 mL/hr (07/10/18 1356)   levETIRAcetam Stopped (07/10/18 1217)   potassium chloride     PRN Meds:.hydrALAZINE Medications Prior to Admission:  Prior to Admission medications   Medication Sig Start Date End Date Taking? Authorizing Provider  acetaminophen (TYLENOL) 650 MG CR tablet Take 1,300 mg by mouth 2 (two) times daily.  04/10/17  Yes [provider]  amLODipine (NORVASC) 10 MG tablet Take 10 mg by mouth daily.   Yes [provider]  Ascorbic Acid (VITAMIN C) 1000 MG tablet Take 1,000 mg by mouth daily.   Yes [provider]  aspirin EC 81 MG tablet Take 81 mg by mouth daily.    Yes [provider]  Cholecalciferol (VITAMIN D) 125 MCG (5000 UT) CAPS Take 5,000 Units by mouth daily.  01/04/18  Yes [provider]   cyanocobalamin 1000 MCG tablet Take 1,000 mcg by mouth daily.   Yes [provider]  docusate sodium (COLACE) 100 MG capsule Take 1 capsule (100 mg total) by mouth 2 (two) times daily. 04/10/17  Yes Salary, Avel Peace, MD  folic acid (FOLVITE) 1 MG tablet Take 1 mg by mouth daily.   Yes [provider]  isosorbide mononitrate (IMDUR) 30 MG 24 hr tablet Take 30 mg by mouth daily.    Yes [provider]  losartan (COZAAR) 100 MG tablet Take 100 mg by mouth daily.    Yes [provider]  methotrexate (RHEUMATREX) 2.5 MG tablet Take 12.5 tablets by mouth every Friday.  10/28/15  Yes [provider]  metoprolol (LOPRESSOR) 100 MG tablet Take 100 mg by mouth 2 (two) times daily.    Yes [provider]  Multiple Vitamin (MULTI-VITAMINS) TABS Take 1 tablet by mouth daily.   Yes [provider]  sertraline (ZOLOFT) 100 MG tablet Take 100 mg by mouth daily.   Yes [provider]   Allergies  Allergen Reactions   Atorvastatin Other (See Comments)    Myositis   Bisphosphonates Other (See Comments)    GI upset   Codeine Other (See Comments)   Memantine Other (See Comments)   Nsaids Other (See Comments)    GI upset   Omeprazole Nausea Only   Other Other (See Comments)    Leg cramps GI upset   Salsalate Other (See Comments)   Sulfa Antibiotics     Pt doesn't remember   Tamoxifen Other (See Comments)    Leg cramps   Penicillins Itching    Has patient had a PCN reaction causing immediate rash, facial/tongue/throat swelling, SOB or lightheadedness with hypotension: Yes Has patient had a PCN reaction causing severe rash involving mucus membranes or skin necrosis: No Has patient had a PCN reaction that required hospitalization: No Has patient had a PCN reaction occurring within the last 10 years: No  If all of the above answers are "NO", then may proceed with Cephalosporin use.   Review of Systems  Unable to perform  ROS   Physical Exam Vitals signs and nursing note reviewed.  Constitutional:      Appearance: She is normal weight.  HENT:     Head:     Comments: Laceration over R eye Cardiovascular:     Rate and Rhythm: Normal rate.     Pulses: Normal pulses.  Pulmonary:     Effort: Pulmonary effort is normal.  Abdominal:     Palpations: Abdomen is soft.  Skin:    General: Skin is warm and dry.     Coloration: Skin is pale.  Neurological:     Comments: Does not wake to voice or touch     Vital Signs: BP (!) 177/81 (BP Location: Right Arm)    Pulse 99    Temp 99.3 F (37.4 C) (Axillary)    Resp 18    Ht 5\' 5"  (1.651 m)    Wt 65 kg    SpO2 95%    BMI 23.85 kg/m  Pain Scale: PAINAD       SpO2: SpO2: 95 % O2 Device:SpO2: 95 % O2 Flow Rate: .O2 Flow Rate (L/min): 3.5 L/min  IO: Intake/output summary:   Intake/Output Summary (Last 24 hours) at 07/10/2018 1517 Last data filed at 07/10/2018 1356 Gross per 24 hour  Intake 103.74 ml  Output --  Net 103.74 ml    LBM: Last BM Date: (last BM unknown) Baseline Weight: Weight: 65 kg Most recent weight: Weight: 65 kg     Palliative Assessment/Data: PPS: 10%     Thank you for this consult. Palliative medicine will continue to follow and assist as needed.   Time In: 7253 Time Out: 1600 Time Total: 75 minutes Greater than 50%  of this time was spent counseling and coordinating care related to the above assessment and plan.  Signed by: Mariana Kaufman, AGNP-C Palliative Medicine    Please contact Palliative Medicine Team phone at [phone] for questions and concerns.  For individual provider: See Shea Evans

## 2018-07-10 NOTE — Progress Notes (Signed)
Please note, patient is currently followed by [facility] outpatient Palliative at [facility]. CSW Annamaria Boots made aware.  Flo Shanks BSN, RN, [facility] [employer] [phone]

## 2018-07-10 NOTE — Progress Notes (Signed)
Family Meeting Note  Advance Directive:yes  Today a meeting took place with the Patient's daughter over phone.  Patient is unable to participate due KD:XIPJAS capacity Comatose   The following clinical team members were present during this meeting:MD  The following were discussed:Patient's diagnosis: 85 y.o. female with a known history of end stage dementia lives at [facility] for over a month now since [facility] transferred their patients out (was long term resident at [facility] for over a yr) with palliative care following as an outpt. Being admitted for suspected seizure   Past Medical History:  Diagnosis Date  . breast cancer   . DVT (deep venous thrombosis) ([place]) 06/2005   RLE ; treated x 3 months with Coumadin  . Fibrocystic breast disease    Severe; status post bilateral mastectomies and implants  . GERD (gastroesophageal reflux disease)    Severe; manifested vocal cord irritation, status-post Nissen fundcoplication in 06/537  . History of ataxia    with questionable right upper and lower  . History of breast cancer   . History of exertional chest pain    with her last stress echo in 09/1999 being normal  . History of migraine    vascular  . History of shingles   . History of syncope 12/1999   With negative cardiac workup to include an echocardiogram, which revealed only mild to moderate MR, a CT scan of the chest, which was negative, and a Cardiolite stress test, which was normal  . Hyperlipidemia, unspecified   . Hypertension    CTA 05/2004 showed no renovascular cause  . Mild dementia ([place])   . Muscle atrophy    Positive FANA and anti- SCL70 antibodies; S/p biopsy without eosinic fascitis; Remicade discontinued  . Osteoarthritis    a. Cervical spine. b. Lumbar spine/spinal stenosis. [facility])  . Osteoporosis    a. Fosamax b. Right wrist fracture. c. Sternal fracture ([place])  . Rheumatoid arthritis without elevated rheumatoid factor (HCC)    a. Methotrexate. b. S/p Remicade.  c. Seronegative. d. S/p Plaquenil. ([place])     Patient's progosis: < 6 months and Goals for treatment: DNR  Additional follow-up to be provided: Daughter is in agreement with Hospice if she qualifies. Prefers Hospice Home if appropriate. Palliative care c/s  Time spent during discussion:20 minutes  Max Sane, MD

## 2018-07-10 NOTE — ED Notes (Signed)
Verbal order from Dr. Manuella Ghazi to try mittens on pt as she is attempting to take everything off and rip IV out.

## 2018-07-10 NOTE — Progress Notes (Signed)
eeg completed ° °

## 2018-07-10 NOTE — ED Notes (Signed)
Pt was thrashing in bed and was combative with this RN and Katie, NT while doing in and out. Pt not responding but was fighting and moving despite reorientation atttempts. See new orders.

## 2018-07-10 NOTE — ED Provider Notes (Signed)
[facility] Emergency Department Provider Note   ____________________________________________    I have reviewed the triage vital signs and the nursing notes.   HISTORY  Chief Complaint Seizures and Fall  History limited by altered mental status   HPI [name] [name] is a 85 y.o. female who presents after reported seizure/fall, history is somewhat limited.  Patient is coming from [place].  EMS reports active seizure and given Versed.  Patient apparently does have a history of dementia, patient is altered currently no further history available  Past Medical History:  Diagnosis Date  . breast cancer   . DVT (deep venous thrombosis) ([place]) 06/2005   RLE ; treated x 3 months with Coumadin  . Fibrocystic breast disease    Severe; status post bilateral mastectomies and implants  . GERD (gastroesophageal reflux disease)    Severe; manifested vocal cord irritation, status-post Nissen fundcoplication in 03/4399  . History of ataxia    with questionable right upper and lower  . History of breast cancer   . History of exertional chest pain    with her last stress echo in 09/1999 being normal  . History of migraine    vascular  . History of shingles   . History of syncope 12/1999   With negative cardiac workup to include an echocardiogram, which revealed only mild to moderate MR, a CT scan of the chest, which was negative, and a Cardiolite stress test, which was normal  . Hyperlipidemia, unspecified   . Hypertension    CTA 05/2004 showed no renovascular cause  . Mild dementia ([place])   . Muscle atrophy    Positive FANA and anti- SCL70 antibodies; S/p biopsy without eosinic fascitis; Remicade discontinued  . Osteoarthritis    a. Cervical spine. b. Lumbar spine/spinal stenosis. [facility])  . Osteoporosis    a. Fosamax b. Right wrist fracture. c. Sternal fracture ([place])  . Rheumatoid arthritis without elevated rheumatoid factor (HCC)    a. Methotrexate. b. S/p  Remicade. c. Seronegative. d. S/p Plaquenil. ([facility])    Patient Active Problem List   Diagnosis Date Noted  . Dementia arising in the senium and presenium ([place]) 03/01/2018  . Palliative care encounter 02/22/2018  . Other chronic pain 02/22/2018  . Essential hypertension, benign 09/23/2017  . Hypokalemia 09/23/2017  . Depression, major, single episode, mild ([place]) 09/23/2017  . Chronic constipation 09/23/2017  . Osteoarthritis 07/03/2016  . Rheumatoid arthritis without elevated rheumatoid factor ([place]) 07/03/2016  . Pulmonary HTN ([place]) 06/19/2016  . GERD (gastroesophageal reflux disease) 10/06/2013  . Osteoporosis 10/06/2013  . Lumbar radiculitis 07/08/2013  . Lumbar spondylosis 07/08/2013  . Lumbar stenosis with neurogenic claudication 07/08/2013    Past Surgical History:  Procedure Laterality Date  . APPENDECTOMY    . CHOLECYSTECTOMY    . IRRIGATION AND DEBRIDEMENT ABSCESS Right 04/08/2017   Procedure: IRRIGATION AND DEBRIDEMENT ABSCESS with wound vac;  Surgeon: Albertine Patricia, DPM;  Location: [facility] ORS;  Service: Podiatry;  Laterality: Right;  . LAPAROSCOPIC NISSEN FUNDOPLICATION  25/3664  . LUMBAR LAMINECTOMY     for spinal stenosis  . MASTECTOMY Bilateral    with implants  . ORIF DISTAL RADIUS FRACTURE Right 07/27/2008   with volar plate  . TOTAL ABDOMINAL HYSTERECTOMY W/ BILATERAL SALPINGOOPHORECTOMY    . VASCULAR SURGERY      Prior to Admission medications   Medication Sig Start Date End Date Taking? Authorizing Provider  acetaminophen (TYLENOL) 650 MG CR tablet Take 1,300 mg by mouth 2 (two) times  daily.  04/10/17   [provider]  amLODipine (NORVASC) 10 MG tablet Take 10 mg by mouth daily.    [provider]  Ascorbic Acid (VITAMIN C) 1000 MG tablet Take 1,000 mg by mouth daily.    [provider]  aspirin EC 81 MG tablet Take 81 mg by mouth daily.     [provider]  Cholecalciferol (VITAMIN D) 125 MCG (5000 UT) CAPS Take 5,000  Units by mouth daily.  01/04/18   [provider]  cyanocobalamin 1000 MCG tablet Take 1,000 mcg by mouth daily.    [provider]  docusate sodium (COLACE) 100 MG capsule Take 1 capsule (100 mg total) by mouth 2 (two) times daily. 04/10/17   Salary, Avel Peace, MD  folic acid (FOLVITE) 1 MG tablet Take 1 mg by mouth daily.    [provider]  ipratropium-albuterol (DUONEB) 0.5-2.5 (3) MG/3ML SOLN Take 3 mLs by nebulization every 6 (six) hours as needed.    [provider]  isosorbide mononitrate (IMDUR) 30 MG 24 hr tablet Take 30 mg by mouth daily.     [provider]  losartan (COZAAR) 100 MG tablet Take 100 mg by mouth daily.     [provider]  methotrexate (RHEUMATREX) 2.5 MG tablet Take 6 tablets by mouth every Friday.  10/28/15   [provider]  metoprolol (LOPRESSOR) 100 MG tablet Take 100 mg by mouth 2 (two) times daily.     [provider]  Multiple Vitamin (MULTI-VITAMINS) TABS Take 1 tablet by mouth daily.    [provider]  NON FORMULARY Diet: Regular    [provider]  sertraline (ZOLOFT) 100 MG tablet Take 100 mg by mouth daily.    [provider]     Allergies Atorvastatin; Bisphosphonates; Codeine; Memantine; Nsaids; Omeprazole; Other; Salsalate; Sulfa antibiotics; Tamoxifen; and Penicillins  Family History  Problem Relation Age of Onset  . Heart failure Mother   . Hypertension Mother   . Cancer Father   . Heart attack Sister     Social History Social History   Tobacco Use  . Smoking status: Never Smoker  . Smokeless tobacco: Never Used  Substance Use Topics  . Alcohol use: No  . Drug use: No    Unable to obtain review of Systems due to altered mental status    ____________________________________________   PHYSICAL EXAM:  VITAL SIGNS: ED Triage Vitals  Enc Vitals Group     BP 07/10/18 0737 (!) 218/113     Pulse Rate 07/10/18 0733 85     Resp 07/10/18  0737 20     Temp 07/10/18 0733 (!) 96.8 F (36 C)     Temp Source 07/10/18 0733 Axillary     SpO2 07/10/18 0733 (!) 84 %     Weight 07/10/18 0734 65 kg (143 lb 4.8 oz)     Height 07/10/18 0734 1.651 m (5\' 5" )     Head Circumference --      Peak Flow --      Pain Score --      Pain Loc --      Pain Edu? --      Excl. in [place]? --     Constitutional: Eyes open but not responding Eyes: Conjunctivae are normal.  Left gaze deviation, left pupil fully dilated, right pupil 3 mm Head: Atraumatic. Nose: No epistaxis Mouth/Throat: Mucous membranes are moist.    Cardiovascular: Normal rate, regular rhythm. Grossly normal heart sounds.  Good peripheral circulation. Respiratory: Mild tachypnea.  No retractions.  Bibasilar Rales Gastrointestinal: Soft . No distention.    Musculoskeletal:  Warm and well perfused, no evidence of traumatic injury Neurologic: Left gaze deviation, patient unresponsive to voice/commands, pain Skin:  Skin is warm, dry and intact. No rash noted. Psychiatric: Unable to examine  ____________________________________________   LABS (all labs ordered are listed, but only abnormal results are displayed)  Labs Reviewed  CBC WITH DIFFERENTIAL/PLATELET - Abnormal; Notable for the following components:      Result Value   WBC 19.2 (*)    Hemoglobin 16.4 (*)    HCT 47.9 (*)    MCV 111.1 (*)    MCH 38.1 (*)    Neutro Abs 17.2 (*)    Abs Immature Granulocytes 0.34 (*)    All other components within normal limits  COMPREHENSIVE METABOLIC PANEL - Abnormal; Notable for the following components:   Potassium 3.3 (*)    Glucose, Bld 175 (*)    AST 51 (*)    Total Bilirubin 1.3 (*)    All other components within normal limits  URINALYSIS, COMPLETE (UACMP) WITH MICROSCOPIC - Abnormal; Notable for the following components:   Color, Urine YELLOW (*)    APPearance CLEAR (*)    Glucose, UA 50 (*)    Hgb urine dipstick LARGE (*)    Ketones, ur 5 (*)    Protein, ur 100 (*)     All other components within normal limits  LACTIC ACID, PLASMA - Abnormal; Notable for the following components:   Lactic Acid, Venous 2.6 (*)    All other components within normal limits  SARS CORONAVIRUS 2 (HOSPITAL ORDER, [place] [facility])  APTT  PROTIME-INR  LACTIC ACID, PLASMA  CBG MONITORING, ED   ____________________________________________  EKG  ED ECG REPORT I, Lavonia Drafts, the attending physician, personally viewed and interpreted this ECG.  Date: 07/10/2018  Rhythm: normal sinus rhythm QRS Axis: normal Intervals: normal ST/T Wave abnormalities: Abnormal, ST depressions noted in 1 II and laterally no ST elevation noted Narrative Interpretation: Concerning for possible posterior ischemia  ____________________________________________  RADIOLOGY  CT head unremarkable ____________________________________________   PROCEDURES  Procedure(s) performed: No  Procedures   Critical Care performed: yes  CRITICAL CARE Performed by: Lavonia Drafts   Total critical care time: 30 minutes  Critical care time was exclusive of separately billable procedures and treating other patients.  Critical care was necessary to treat or prevent imminent or life-threatening deterioration.  Critical care was time spent personally by me on the following activities: development of treatment plan with patient and/or surrogate as well as nursing, discussions with consultants, evaluation of patient's response to treatment, examination of patient, obtaining history from patient or surrogate, ordering and performing treatments and interventions, ordering and review of laboratory studies, ordering and review of radiographic studies, pulse oximetry and re-evaluation of patient's condition.  ____________________________________________   INITIAL IMPRESSION / ASSESSMENT AND PLAN / ED COURSE  Pertinent labs & imaging results that were available during my care of the  patient were reviewed by me and considered in my medical decision making (see chart for details).  Patient presents with left gaze deviation, altered mental status, reported possible seizure.  Left pupil fully dilated, highly concerning for ICH versus large stroke.  Stat CT ordered, CBG 161.  Also patient is significantly hypoxic and requiring oxygen, she arrived with room air sats of 84%.   EKG is concerning, diffuse ST depressions inferiorly and laterally suggestive  of posterior ischemia.  However given possibility of hemorrhagic stroke pending CT   ----------------------------------------- 8:05 AM on 07/10/2018 ----------------------------------------- Made aware of MOST form which states the patient will accept IV fluids and antibiotics but primarily comfort measures, DNR.  Discussed with Dr. Irish Elders of neurology who agrees no indication for code stroke given the above, he will consult  Discussed with hospitalist for admission     ____________________________________________   FINAL CLINICAL IMPRESSION(S) / ED DIAGNOSES  Final diagnoses:  Seizure ([place])  Cerebrovascular accident (CVA), unspecified mechanism ([place])        Note:  This document was prepared using Dragon voice recognition software and may include unintentional dictation errors.   Lavonia Drafts, MD 07/10/18 1013

## 2018-07-10 NOTE — ED Triage Notes (Signed)
Pt comes via EMS after a fall. Pt was acting funny before fall with muscle spasms and AMS. Pt then fell. Pt unresponsive with EMS then began having seizures. 1mg  versed given with EMS. Pupils equal but pinpoint and gaze is fixed. 260/130 with EMS manual. CBG 163.

## 2018-07-10 NOTE — ED Notes (Signed)
ED TO INPATIENT HANDOFF REPORT  ED Nurse Name and Phone #: Janett Billow [phone]  S Name/Age/Gender [name] [name] 85 y.o. female Room/Bed: ED26A/ED26A  Code Status   Code Status: Prior  Home/SNF/Other Nursing Home Not oriented to anything Is this baseline? No   Triage Complete: Triage complete  Chief Complaint weakness  Triage Note Pt comes via EMS after a fall. Pt was acting funny before fall with muscle spasms and AMS. Pt then fell. Pt unresponsive with EMS then began having seizures. 1mg  versed given with EMS. Pupils equal but pinpoint and gaze is fixed. 260/130 with EMS manual. CBG 163.    Allergies Allergies  Allergen Reactions  . Atorvastatin Other (See Comments)    Myositis  . Bisphosphonates Other (See Comments)    GI upset  . Codeine Other (See Comments)  . Memantine Other (See Comments)  . Nsaids Other (See Comments)    GI upset  . Omeprazole Nausea Only  . Other Other (See Comments)    Leg cramps GI upset  . Salsalate Other (See Comments)  . Sulfa Antibiotics     Pt doesn't remember  . Tamoxifen Other (See Comments)    Leg cramps  . Penicillins Itching    Has patient had a PCN reaction causing immediate rash, facial/tongue/throat swelling, SOB or lightheadedness with hypotension: Yes Has patient had a PCN reaction causing severe rash involving mucus membranes or skin necrosis: No Has patient had a PCN reaction that required hospitalization: No Has patient had a PCN reaction occurring within the last 10 years: No If all of the above answers are "NO", then may proceed with Cephalosporin use.    Level of Care/Admitting Diagnosis ED Disposition    ED Disposition Condition [facility] Area: [place] [[id number]]  Level of Care: Med-Surg [16]  Covid Evaluation: N/A  Diagnosis: Seizure [facility]) [[id number]]  Admitting Physician: Hines, [place]  Attending Physician: Max Sane [[id number]]  Estimated length of stay: past  midnight tomorrow  Certification:: I certify this patient will need inpatient services for at least 2 midnights  PT Class (Do Not Modify): Inpatient [101]  PT Acc Code (Do Not Modify): Private [1]       B Medical/Surgery History Past Medical History:  Diagnosis Date  . breast cancer   . DVT (deep venous thrombosis) ([place]) 06/2005   RLE ; treated x 3 months with Coumadin  . Fibrocystic breast disease    Severe; status post bilateral mastectomies and implants  . GERD (gastroesophageal reflux disease)    Severe; manifested vocal cord irritation, status-post Nissen fundcoplication in 10/3265  . History of ataxia    with questionable right upper and lower  . History of breast cancer   . History of exertional chest pain    with her last stress echo in 09/1999 being normal  . History of migraine    vascular  . History of shingles   . History of syncope 12/1999   With negative cardiac workup to include an echocardiogram, which revealed only mild to moderate MR, a CT scan of the chest, which was negative, and a Cardiolite stress test, which was normal  . Hyperlipidemia, unspecified   . Hypertension    CTA 05/2004 showed no renovascular cause  . Mild dementia ([place])   . Muscle atrophy    Positive FANA and anti- SCL70 antibodies; S/p biopsy without eosinic fascitis; Remicade discontinued  . Osteoarthritis    a. Cervical spine. b. Lumbar spine/spinal stenosis. [facility])  .  Osteoporosis    a. Fosamax b. Right wrist fracture. c. Sternal fracture ([place])  . Rheumatoid arthritis without elevated rheumatoid factor (HCC)    a. Methotrexate. b. S/p Remicade. c. Seronegative. d. S/p Plaquenil. [facility])   Past Surgical History:  Procedure Laterality Date  . APPENDECTOMY    . CHOLECYSTECTOMY    . IRRIGATION AND DEBRIDEMENT ABSCESS Right 04/08/2017   Procedure: IRRIGATION AND DEBRIDEMENT ABSCESS with wound vac;  Surgeon: Albertine Patricia, DPM;  Location: [facility] ORS;  Service: Podiatry;  Laterality: Right;  .  LAPAROSCOPIC NISSEN FUNDOPLICATION  16/1096  . LUMBAR LAMINECTOMY     for spinal stenosis  . MASTECTOMY Bilateral    with implants  . ORIF DISTAL RADIUS FRACTURE Right 07/27/2008   with volar plate  . TOTAL ABDOMINAL HYSTERECTOMY W/ BILATERAL SALPINGOOPHORECTOMY    . VASCULAR SURGERY       A IV Location/Drains/Wounds Patient Lines/Drains/Airways Status   Active Line/Drains/Airways    Name:   Placement date:   Placement time:   Site:   Days:   Peripheral IV 04/08/17 Anterior;Distal;Left Arm   04/08/17    -    Arm   458   Peripheral IV 07/10/18 Left Antecubital   07/10/18    0745    Antecubital   less than 1   PICC Single Lumen 04/54/09 PICC Right Basilic 38 cm 0 cm   81/19/14    7829    Basilic   562   Negative Pressure Wound Therapy Foot Right   04/08/17    0900    -   458   Incision (Closed) 04/08/17 Foot Right   04/08/17    0935     458          Intake/Output Last 24 hours No intake or output data in the 24 hours ending 07/10/18 1209  Labs/Imaging Results for orders placed or performed during the hospital encounter of 07/10/18 (from the past 48 hour(s))  CBC with Differential     Status: Abnormal   Collection Time: 07/10/18  7:43 AM  Result Value Ref Range   WBC 19.2 (H) 4.0 - 10.5 K/uL   RBC 4.31 3.87 - 5.11 MIL/uL   Hemoglobin 16.4 (H) 12.0 - 15.0 g/dL   HCT 47.9 (H) 36.0 - 46.0 %   MCV 111.1 (H) 80.0 - 100.0 fL   MCH 38.1 (H) 26.0 - 34.0 pg   MCHC 34.2 30.0 - 36.0 g/dL   RDW 14.5 11.5 - 15.5 %   Platelets 228 150 - 400 K/uL   nRBC 0.1 0.0 - 0.2 %   Neutrophils Relative % 89 %   Neutro Abs 17.2 (H) 1.7 - 7.7 K/uL   Lymphocytes Relative 4 %   Lymphs Abs 0.8 0.7 - 4.0 K/uL   Monocytes Relative 4 %   Monocytes Absolute 0.8 0.1 - 1.0 K/uL   Eosinophils Relative 1 %   Eosinophils Absolute 0.1 0.0 - 0.5 K/uL   Basophils Relative 0 %   Basophils Absolute 0.1 0.0 - 0.1 K/uL   WBC Morphology MORPHOLOGY UNREMARKABLE    RBC Morphology MORPHOLOGY UNREMARKABLE    Smear  Review Normal platelet morphology    Immature Granulocytes 2 %   Abs Immature Granulocytes 0.34 (H) 0.00 - 0.07 K/uL    Comment: Performed at [facility], [address], [place], [place] [address]  Comprehensive metabolic panel     Status: Abnormal   Collection Time: 07/10/18  7:43 AM  Result Value Ref Range  Sodium 139 135 - 145 mmol/L   Potassium 3.3 (L) 3.5 - 5.1 mmol/L    Comment: HEMOLYSIS AT THIS LEVEL MAY AFFECT RESULT   Chloride 102 98 - 111 mmol/L   CO2 23 22 - 32 mmol/L   Glucose, Bld 175 (H) 70 - 99 mg/dL   BUN 12 8 - 23 mg/dL   Creatinine, Ser 0.66 0.44 - 1.00 mg/dL   Calcium 9.1 8.9 - 10.3 mg/dL   Total Protein 7.8 6.5 - 8.1 g/dL   Albumin 4.4 3.5 - 5.0 g/dL   AST 51 (H) 15 - 41 U/L   ALT 30 0 - 44 U/L   Alkaline Phosphatase 78 38 - 126 U/L   Total Bilirubin 1.3 (H) 0.3 - 1.2 mg/dL   GFR calc non Af Amer >60 >60 mL/min   GFR calc Af Amer >60 >60 mL/min   Anion gap 14 5 - 15    Comment: Performed at [facility], [address] [place] [address]  APTT     Status: None   Collection Time: 07/10/18  7:43 AM  Result Value Ref Range   aPTT 30 24 - 36 seconds    Comment: Performed at [facility], [address] [place], [place] [address]  Protime-INR     Status: None   Collection Time: 07/10/18  7:43 AM  Result Value Ref Range   Prothrombin Time 13.9 11.4 - 15.2 seconds   INR 1.1 0.8 - 1.2    Comment: (NOTE) INR goal varies based on device and disease states. Performed at [facility], [address] [place],  [address]   SARS Coronavirus 2 (CEPHEID- Performed in [facility] hospital lab), Hosp Order     Status: None   Collection Time: 07/10/18  7:43 AM  Result Value Ref Range   SARS Coronavirus 2 NEGATIVE NEGATIVE    Comment: (NOTE) If result is NEGATIVE SARS-CoV-2 target nucleic acids are NOT DETECTED. The SARS-CoV-2 RNA is generally detectable in upper and lower  respiratory specimens during the  acute phase of infection. The lowest  concentration of SARS-CoV-2 viral copies this assay can detect is 250  copies / mL. A negative result does not preclude SARS-CoV-2 infection  and should not be used as the sole basis for treatment or other  patient management decisions.  A negative result may occur with  improper specimen collection / handling, submission of specimen other  than nasopharyngeal swab, presence of viral mutation(s) within the  areas targeted by this assay, and inadequate number of viral copies  (<250 copies / mL). A negative result must be combined with clinical  observations, patient history, and epidemiological information. If result is POSITIVE SARS-CoV-2 target nucleic acids are DETECTED. The SARS-CoV-2 RNA is generally detectable in upper and lower  respiratory specimens dur ing the acute phase of infection.  Positive  results are indicative of active infection with SARS-CoV-2.  Clinical  correlation with patient history and other diagnostic information is  necessary to determine patient infection status.  Positive results do  not rule out bacterial infection or co-infection with other viruses. If result is PRESUMPTIVE POSTIVE SARS-CoV-2 nucleic acids MAY BE PRESENT.   A presumptive positive result was obtained on the submitted specimen  and confirmed on repeat testing.  While 2019 novel coronavirus  (SARS-CoV-2) nucleic acids may be present in the submitted sample  additional confirmatory testing may be necessary for epidemiological  and / or clinical management purposes  to differentiate between  SARS-CoV-2 and other Sarbecovirus currently known to infect humans.  If clinically indicated additional testing with an alternate test  methodology [phone]) is advised. The SARS-CoV-2 RNA is generally  detectable in upper and lower respiratory sp ecimens during the acute  phase of infection. The expected result is Negative. Fact Sheet for Patients:   StrictlyIdeas.no Fact Sheet for Healthcare Providers: BankingDealers.co.za This test is not yet approved or cleared by the [place] FDA and has been authorized for detection and/or diagnosis of SARS-CoV-2 by FDA under an Emergency Use Authorization (EUA).  This EUA will remain in effect (meaning this test can be used) for the duration of the COVID-19 declaration under Section 564(b)(1) of the Act, 21 U.S.C. section 360bbb-3(b)(1), unless the authorization is terminated or revoked sooner. Performed at [facility], [address] [place], [place] [address]   Urinalysis, Complete w Microscopic     Status: Abnormal   Collection Time: 07/10/18  7:43 AM  Result Value Ref Range   Color, Urine YELLOW (A) YELLOW   APPearance CLEAR (A) CLEAR   Specific Gravity, Urine 1.014 1.005 - 1.030   pH 6.0 5.0 - 8.0   Glucose, UA 50 (A) NEGATIVE mg/dL   Hgb urine dipstick LARGE (A) NEGATIVE   Bilirubin Urine NEGATIVE NEGATIVE   Ketones, ur 5 (A) NEGATIVE mg/dL   Protein, ur 100 (A) NEGATIVE mg/dL   Nitrite NEGATIVE NEGATIVE   Leukocytes,Ua NEGATIVE NEGATIVE   RBC / HPF 6-10 0 - 5 RBC/hpf   WBC, UA 6-10 0 - 5 WBC/hpf   Bacteria, UA NONE SEEN NONE SEEN   Squamous Epithelial / LPF 0-5 0 - 5   Mucus PRESENT     Comment: Performed at [facility], [address] [place], [place] [address]  Lactic acid, plasma     Status: Abnormal   Collection Time: 07/10/18  7:43 AM  Result Value Ref Range   Lactic Acid, Venous 2.6 (HH) 0.5 - 1.9 mmol/L    Comment: CRITICAL RESULT CALLED TO, READ BACK BY AND VERIFIED WITH JESSICA Matheson Vandehei AT 0946 ON 07/10/18 [facility] Performed at [facility], [address] [place], [place] [address]    Ct Head Wo Contrast  Result Date: 07/10/2018 CLINICAL DATA:  Seizure following fall EXAM: CT HEAD WITHOUT CONTRAST TECHNIQUE: Contiguous axial images were obtained from the base of the skull through the  vertex without intravenous contrast. COMPARISON:  March 18, 2017 FINDINGS: Brain: Mild to moderate diffuse atrophy is stable. There is no intracranial mass, hemorrhage, extra-axial fluid collection, or midline shift. There is small vessel disease throughout the centra semiovale bilaterally. Small vessel disease is noted in the anterior limbs of each internal and external capsule, stable. There is a small infarct in the posterior inferior to midportion of the right cerebellum, stable. No acute infarct is evident. Vascular: There is no hyperdense vessel. There is calcification in each distal vertebral artery as well as in each carotid siphon region. Skull: The bony calvarium appears intact. Sinuses/Orbits: There is mucosal thickening in several ethmoid air cells. Other visualized paranasal sinuses are clear. Visualized orbits appear symmetric bilaterally. Other: Visualized mastoid air cells are clear. IMPRESSION: Stable atrophy with supratentorial small vessel disease. 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 No acute infarct evident. No mass or hemorrhage. There are foci of arterial vascular calcification. There is mucosal thickening in several ethmoid air cells. Electronically Signed   By: Lowella Grip III M.D.   On: 07/10/2018 08:32   Dg  Chest Port 1 View  Result Date: 07/10/2018 CLINICAL DATA:  Altered mental status.  Recent fall. EXAM: PORTABLE CHEST 1 VIEW COMPARISON:  April 09, 2017 FINDINGS: There is no edema or consolidation. Heart is mildly enlarged with pulmonary vascularity normal. No adenopathy. There is aortic atherosclerosis. No pneumothorax. No acute fracture evident. There is an old healed fracture of the lateral left sixth and seventh ribs. Bones are osteoporotic. IMPRESSION: No edema or consolidation. Stable cardiac prominence. Bones osteoporotic. Aortic Atherosclerosis (ICD10-I70.0). Electronically Signed   By: Lowella Grip III M.D.   On: 07/10/2018 08:57     Pending Labs Unresulted Labs (From admission, onward)    Start     Ordered   07/10/18 0925  Lactic acid, plasma  Now then every 2 hours,   STAT     07/10/18 0924   Signed and Held  CBC  (enoxaparin (LOVENOX)    CrCl >/= 30 ml/min)  Once,   R    Comments:  Baseline for enoxaparin therapy IF NOT ALREADY DRAWN.  Notify MD if PLT < 100 K.    Signed and Held   Signed and Held  Creatinine, serum  (enoxaparin (LOVENOX)    CrCl >/= 30 ml/min)  Once,   R    Comments:  Baseline for enoxaparin therapy IF NOT ALREADY DRAWN.    Signed and Held   Signed and Held  Creatinine, serum  (enoxaparin (LOVENOX)    CrCl >/= 30 ml/min)  Weekly,   R    Comments:  while on enoxaparin therapy    Signed and Held          Vitals/Pain Today's Vitals   07/10/18 1014 07/10/18 1030 07/10/18 1100 07/10/18 1145  BP:  (!) 234/91 (!) 257/106 (!) 173/128  Pulse: 90 89 97 (!) 109  Resp: 19 17 (!) 24 (!) 24  Temp:      TempSrc:      SpO2: 100% 100% 100% 91%  Weight:      Height:        Isolation Precautions No active isolations  Medications Medications  levETIRAcetam (KEPPRA) IVPB 500 mg/100 mL premix (500 mg Intravenous New Bag/Given 07/10/18 1202)  LORazepam (ATIVAN) injection 0.5 mg (0.5 mg Intravenous Given 07/10/18 4098)    Mobility non-ambulatory High fall risk   Focused Assessments Neuro Assessment Handoff:  Swallow screen pass? Not able to complete   NIH Stroke Scale ( + Modified Stroke Scale Criteria)  Interval: Initial Level of Consciousness (1a.)   : Not alert, requires repeated stimulation to attend, or is obtunded and requires strong or painful stimulation to make movements (not stereotyped) LOC Questions (1b. )   +: Answers neither question correctly LOC Commands (1c. )   + : Performs neither task correctly Best Gaze (2. )  +: Forced deviation Visual (3. )  +: Bilateral hemianopia (blind including cortical blindness) Facial Palsy (4. )    : Normal symmetrical movements Motor  Arm, Left (5a. )   +: No effort against gravity Motor Arm, Right (5b. )   +: No effort against gravity Motor Leg, Left (6a. )   +: No effort against gravity Motor Leg, Right (6b. )   +: No effort against gravity Limb Ataxia (7. ): Absent Sensory (8. )   +: Severe to total sensory loss, patient is not aware of being touched in the face, arm, and leg Best Language (9. )   +: Mute, global aphasia Dysarthria (10. ): Severe dysarthria, patient's speech is so  slurred as to be unintelligible in the absence of or out of proportion to any dysphasia, or is mute/anarthric Extinction/Inattention (11.)   +: Profound hemi-inattention or extinction to more than one modality Modified SS Total  +: 28 Complete NIHSS TOTAL: 32     Neuro Assessment: Exceptions to WDL Neuro Checks:   Initial (07/10/18 0821)  Last Documented NIHSS Modified Score: 28 (07/10/18 0922) Has TPA been given? No If patient is a Neuro Trauma and patient is going to OR before floor call report to [place] nurse: [phone] or [phone]     R Recommendations: See Admitting Provider Note  Report given to:   Additional Notes:

## 2018-07-10 NOTE — Procedures (Signed)
History: 85 year old female being evaluated for encephalopathy, suspected seizure  Sedation: None  Technique: This is a 21 channel routine scalp EEG performed at the bedside with bipolar and monopolar montages arranged in accordance to the international 10/20 system of electrode placement. One channel was dedicated to EKG recording.    Background: The background consists predominantly of relatively high voltage generalized irregular delta range activity.  There are moderately frequent left posterior temporal (P7 >T7) sharp wave discharges without rhythmicity, periodicity or evolution.  There is a poorly formed, poorly sustained posterior dominant rhythm of 7 to 8 Hz which is seen at times.  Photic stimulation: Physiologic driving is not performed  EEG Abnormalities: 1) moderately frequent left posterior temporal sharp waves 2) generalized irregular slow activity 3) slow posterior dominant rhythm  Clinical Interpretation: This EEG is consistent with an area of potential seizure focus in the left posterior temporal region. There was no seizure recorded on this study.   Roland Rack, MD [facility] [phone]  If 7pm- 7am, please page neurology on call as listed in [place].

## 2018-07-10 NOTE — Progress Notes (Signed)
MD aware that last lactic result 2.7

## 2018-07-11 DIAGNOSIS — Z7189 Other specified counseling: Secondary | ICD-10-CM

## 2018-07-11 LAB — POTASSIUM: Potassium: 3.8 mmol/L (ref 3.5–5.1)

## 2018-07-11 MED ORDER — ACETAMINOPHEN 650 MG RE SUPP
650.0000 mg | Freq: Four times a day (QID) | RECTAL | Status: DC | PRN
  Administered 2018-07-11: 650 mg via RECTAL
  Filled 2018-07-11: qty 1

## 2018-07-11 MED ORDER — ACETAMINOPHEN 325 MG PO TABS: 650.0000 mg | ORAL_TABLET | Freq: Four times a day (QID) | ORAL | Status: DC | PRN

## 2018-07-11 MED ORDER — SODIUM CHLORIDE 0.9 % IV SOLN
750.0000 mg | Freq: Two times a day (BID) | INTRAVENOUS | Status: DC
  Administered 2018-07-11 – 2018-07-12 (×2): 750 mg via INTRAVENOUS
  Filled 2018-07-11 (×4): qty 7.5

## 2018-07-11 NOTE — Progress Notes (Signed)
Palliative:   [name] [name] is resting quietly in bed.  She will open her eyes when I call her name, but not really make eye contact.  She appears acutely/chronicall ill and frail with multiple bruises and light scabbing on her face.   There is no family at bedside at this time dt visitor restrictions.   [name] [name] is able to tell me her name, but not where we are.  She appears dehydrated, and agrees that she would like something to drink.  She is able to take a sip from her water cup, but appears weak.  She is able to take another sip, but seems to aspirate.   Call to niece [name] at [phone].  Left VM message.  Second call to niece [name] at [phone].  We talk about neurology consult, Hospitalist recommendations.  We talk about doing something TO [name] [name] or FOR her.  We talk about recommendations for full comfort care by hospitalist. We discuss what residential hospice care looks like. [name] is tearful stating that her mother is not doing well after her cancer surgery and now has shingles.   We talk about returning to SNF with hospice for treat the treatable care, considering do not re hospitalize, transition to comfort care. We talk about the chronic illness pathway and how to care for [facility] when she gets sick again.  [name] shares a story about decline after a recent fall.  We talk about caring for [facility] and letting her body show [place] if she is able to recover.   I share that the medical team agrees that residential hospice would also be a kind and loving choice.    Plan:  Another 24 hours for outcomes.  We plan for a follow up call tomorrow.   55 minutes, extended time Quinn Axe, NP  Palliative Medicine Team  Team Phone # [phone]  Greater than 50% of this time was spent counseling and coordinating care related to the above assessment and plan.

## 2018-07-11 NOTE — Consult Note (Signed)
PHARMACY CONSULT NOTE - FOLLOW UP  Pharmacy Consult for Electrolyte Monitoring and Replacement   Recent Labs: Potassium (mmol/L)  Date Value  07/11/2018 3.8  10/01/2013 3.8   Magnesium (mg/dL)  Date Value  02/25/2018 2.2  05/04/2012 1.5 (L)   Calcium (mg/dL)  Date Value  07/10/2018 9.1   Calcium, Total (mg/dL)  Date Value  10/01/2013 8.6   Albumin (g/dL)  Date Value  07/10/2018 4.4  05/04/2012 2.7 (L)   Sodium (mmol/L)  Date Value  07/10/2018 139  10/01/2013 138     Assessment: Patient K = 3.8  Goal of Therapy:  K wnl's  Plan:  No replenishment warranted at this time - will reassess with am labs and follow  Lu Duffel, PharmD, BCPS Clinical Pharmacist 07/11/2018 7:26 AM

## 2018-07-11 NOTE — Progress Notes (Signed)
Subjective: More alert today. But unclear his baseline.   Past Medical History:  Diagnosis Date  . breast cancer   . DVT (deep venous thrombosis) ([place]) 06/2005   RLE ; treated x 3 months with Coumadin  . Fibrocystic breast disease    Severe; status post bilateral mastectomies and implants  . GERD (gastroesophageal reflux disease)    Severe; manifested vocal cord irritation, status-post Nissen fundcoplication in 06/7844  . History of ataxia    with questionable right upper and lower  . History of breast cancer   . History of exertional chest pain    with her last stress echo in 09/1999 being normal  . History of migraine    vascular  . History of shingles   . History of syncope 12/1999   With negative cardiac workup to include an echocardiogram, which revealed only mild to moderate MR, a CT scan of the chest, which was negative, and a Cardiolite stress test, which was normal  . Hyperlipidemia, unspecified   . Hypertension    CTA 05/2004 showed no renovascular cause  . Mild dementia ([place])   . Muscle atrophy    Positive FANA and anti- SCL70 antibodies; S/p biopsy without eosinic fascitis; Remicade discontinued  . Osteoarthritis    a. Cervical spine. b. Lumbar spine/spinal stenosis. [facility])  . Osteoporosis    a. Fosamax b. Right wrist fracture. c. Sternal fracture ([place])  . Rheumatoid arthritis without elevated rheumatoid factor (HCC)    a. Methotrexate. b. S/p Remicade. c. Seronegative. d. S/p Plaquenil. [facility])    Past Surgical History:  Procedure Laterality Date  . APPENDECTOMY    . CHOLECYSTECTOMY    . IRRIGATION AND DEBRIDEMENT ABSCESS Right 04/08/2017   Procedure: IRRIGATION AND DEBRIDEMENT ABSCESS with wound vac;  Surgeon: Albertine Patricia, DPM;  Location: [facility] ORS;  Service: Podiatry;  Laterality: Right;  . LAPAROSCOPIC NISSEN FUNDOPLICATION  96/2952  . LUMBAR LAMINECTOMY     for spinal stenosis  . MASTECTOMY Bilateral    with implants  . ORIF DISTAL RADIUS FRACTURE Right  07/27/2008   with volar plate  . TOTAL ABDOMINAL HYSTERECTOMY W/ BILATERAL SALPINGOOPHORECTOMY    . VASCULAR SURGERY      Family History  Problem Relation Age of Onset  . Heart failure Mother   . Hypertension Mother   . Cancer Father   . Heart attack Sister     Social History:  reports that she has never smoked. She has never used smokeless tobacco. She reports that she does not drink alcohol or use drugs.  Allergies  Allergen Reactions  . Atorvastatin Other (See Comments)    Myositis  . Bisphosphonates Other (See Comments)    GI upset  . Codeine Other (See Comments)  . Memantine Other (See Comments)  . Nsaids Other (See Comments)    GI upset  . Omeprazole Nausea Only  . Other Other (See Comments)    Leg cramps GI upset  . Salsalate Other (See Comments)  . Sulfa Antibiotics     Pt doesn't remember  . Tamoxifen Other (See Comments)    Leg cramps  . Penicillins Itching    Has patient had a PCN reaction causing immediate rash, facial/tongue/throat swelling, SOB or lightheadedness with hypotension: Yes Has patient had a PCN reaction causing severe rash involving mucus membranes or skin necrosis: No Has patient had a PCN reaction that required hospitalization: No Has patient had a PCN reaction occurring within the last 10 years: No If all of the above answers  are "NO", then may proceed with Cephalosporin use.    Medications: I have reviewed the patient's current medications.  ROS: Unable to obtain as not following commands   Physical Examination: Blood pressure (!) 217/102, pulse (!) 104, temperature 97.9 F (36.6 C), temperature source Axillary, resp. rate 19, height 5\' 5"  (1.651 m), weight 65 kg, SpO2 97 %.  Pt does withdraw from painful stimuli bilaterally No signs of forced eye deviation Pupils and corneal reactive Cough not tested.   Laboratory Studies:   Basic Metabolic Panel: Recent Labs  Lab 07/10/18 0743 07/11/18 0355  NA 139  --   K 3.3* 3.8  CL  102  --   CO2 23  --   GLUCOSE 175*  --   BUN 12  --   CREATININE 0.66  --   CALCIUM 9.1  --     Liver Function Tests: Recent Labs  Lab 07/10/18 0743  AST 51*  ALT 30  ALKPHOS 78  BILITOT 1.3*  PROT 7.8  ALBUMIN 4.4   No results for input(s): LIPASE, AMYLASE in the last 168 hours. No results for input(s): AMMONIA in the last 168 hours.  CBC: Recent Labs  Lab 07/10/18 0743  WBC 19.2*  NEUTROABS 17.2*  HGB 16.4*  HCT 47.9*  MCV 111.1*  PLT 228    Cardiac Enzymes: No results for input(s): CKTOTAL, CKMB, CKMBINDEX, TROPONINI in the last 168 hours.  BNP: Invalid input(s): POCBNP  CBG: No results for input(s): GLUCAP in the last 168 hours.  Microbiology: Results for orders placed or performed during the hospital encounter of 07/10/18  SARS Coronavirus 2 (CEPHEID- Performed in [place] hospital lab), Hosp Order     Status: None   Collection Time: 07/10/18  7:43 AM  Result Value Ref Range Status   SARS Coronavirus 2 NEGATIVE NEGATIVE Final    Comment: (NOTE) If result is NEGATIVE SARS-CoV-2 target nucleic acids are NOT DETECTED. The SARS-CoV-2 RNA is generally detectable in upper and lower  respiratory specimens during the acute phase of infection. The lowest  concentration of SARS-CoV-2 viral copies this assay can detect is 250  copies / mL. A negative result does not preclude SARS-CoV-2 infection  and should not be used as the sole basis for treatment or other  patient management decisions.  A negative result may occur with  improper specimen collection / handling, submission of specimen other  than nasopharyngeal swab, presence of viral mutation(s) within the  areas targeted by this assay, and inadequate number of viral copies  (<250 copies / mL). A negative result must be combined with clinical  observations, patient history, and epidemiological information. If result is POSITIVE SARS-CoV-2 target nucleic acids are DETECTED. The SARS-CoV-2 RNA is  generally detectable in upper and lower  respiratory specimens dur ing the acute phase of infection.  Positive  results are indicative of active infection with SARS-CoV-2.  Clinical  correlation with patient history and other diagnostic information is  necessary to determine patient infection status.  Positive results do  not rule out bacterial infection or co-infection with other viruses. If result is PRESUMPTIVE POSTIVE SARS-CoV-2 nucleic acids MAY BE PRESENT.   A presumptive positive result was obtained on the submitted specimen  and confirmed on repeat testing.  While 2019 novel coronavirus  (SARS-CoV-2) nucleic acids may be present in the submitted sample  additional confirmatory testing may be necessary for epidemiological  and / or clinical management purposes  to differentiate between  SARS-CoV-2 and other Sarbecovirus currently  known to infect humans.  If clinically indicated additional testing with an alternate test  methodology [phone]) is advised. The SARS-CoV-2 RNA is generally  detectable in upper and lower respiratory sp ecimens during the acute  phase of infection. The expected result is Negative. Fact Sheet for Patients:  StrictlyIdeas.no Fact Sheet for Healthcare Providers: BankingDealers.co.za This test is not yet approved or cleared by the [place] FDA and has been authorized for detection and/or diagnosis of SARS-CoV-2 by FDA under an Emergency Use Authorization (EUA).  This EUA will remain in effect (meaning this test can be used) for the duration of the COVID-19 declaration under Section 564(b)(1) of the Act, 21 U.S.C. section 360bbb-3(b)(1), unless the authorization is terminated or revoked sooner. Performed at [facility], [address] [place], [place] [address]   MRSA PCR Screening     Status: None   Collection Time: 07/10/18  1:38 PM  Result Value Ref Range Status   MRSA by PCR NEGATIVE  NEGATIVE Final    Comment:        The GeneXpert MRSA Assay (FDA approved for NASAL specimens only), is one component of a comprehensive MRSA colonization surveillance program. It is not intended to diagnose MRSA infection nor to guide or monitor treatment for MRSA infections. Performed at [facility], [address] [place], [place] [address]     Coagulation Studies: Recent Labs    07/10/18 0743  LABPROT 13.9  INR 1.1    Urinalysis:  Recent Labs  Lab 07/10/18 0743  COLORURINE YELLOW*  LABSPEC 1.014  PHURINE 6.0  GLUCOSEU 50*  HGBUR LARGE*  BILIRUBINUR NEGATIVE  KETONESUR 5*  PROTEINUR 100*  NITRITE NEGATIVE  LEUKOCYTESUR NEGATIVE    Lipid Panel:  No results found for: CHOL, TRIG, HDL, CHOLHDL, VLDL, LDLCALC  HgbA1C: No results found for: HGBA1C  Urine Drug Screen:  No results found for: LABOPIA, COCAINSCRNUR, LABBENZ, AMPHETMU, THCU, LABBARB  Alcohol Level: No results for input(s): ETH in the last 168 hours.  Imaging: Ct Head Wo Contrast  Result Date: 07/10/2018 CLINICAL DATA:  Seizure following fall EXAM: CT HEAD WITHOUT CONTRAST TECHNIQUE: Contiguous axial images were obtained from the base of the skull through the vertex without intravenous contrast. COMPARISON:  March 18, 2017 FINDINGS: Brain: Mild to moderate diffuse atrophy is stable. There is no intracranial mass, hemorrhage, extra-axial fluid collection, or midline shift. There is small vessel disease throughout the centra semiovale bilaterally. Small vessel disease is noted in the anterior limbs of each internal and external capsule, stable. There is a small infarct in the posterior inferior to midportion of the right cerebellum, stable. No acute infarct is evident. Vascular: There is no hyperdense vessel. There is calcification in each distal vertebral artery as well as in each carotid siphon region. Skull: The bony calvarium appears intact. Sinuses/Orbits: There is mucosal thickening in  several ethmoid air cells. Other visualized paranasal sinuses are clear. Visualized orbits appear symmetric bilaterally. Other: Visualized mastoid air cells are clear. IMPRESSION: Stable atrophy with supratentorial small vessel disease. 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 No acute infarct evident. No mass or hemorrhage. There are foci of arterial vascular calcification. There is mucosal thickening in several ethmoid air cells. Electronically Signed   By: Lowella Grip III M.D.   On: 07/10/2018 08:32   Dg Chest Port 1 View  Result Date: 07/10/2018 CLINICAL DATA:  Altered mental status.  Recent fall. EXAM: PORTABLE CHEST 1 VIEW COMPARISON:  April 09, 2017 FINDINGS: There  is no edema or consolidation. Heart is mildly enlarged with pulmonary vascularity normal. No adenopathy. There is aortic atherosclerosis. No pneumothorax. No acute fracture evident. There is an old healed fracture of the lateral left sixth and seventh ribs. Bones are osteoporotic. IMPRESSION: No edema or consolidation. Stable cardiac prominence. Bones osteoporotic. Aortic Atherosclerosis (ICD10-I70.0). Electronically Signed   By: Lowella Grip III M.D.   On: 07/10/2018 08:57     Assessment/Plan:  85 y.o. female with hx of end stage dementia with "MOST" form lives in NH with palliative care following. Last seen normal this AM and apparently because agitated/confused and suspected seizure activity with eyes rolling to left side.  Mentation has improved slightly but she has baseline poor dementia so unknown how far off baseline she is.   - EEG shows epileptiform potential on L temporal lobe but no true seizures - Will increase Keppra to 750 BID.  - MRI pending but not sure it will change the management - Appreciate palliative care following - Pt is DNR 07/11/2018, 11:42 AM

## 2018-07-11 NOTE — Progress Notes (Signed)
[facility] [place] NAME: [name] [name]    MR#:  [id number]  DATE OF BIRTH:  06-Oct-1930  SUBJECTIVE:   Patient here with seizures  REVIEW OF SYSTEMS:  Unable to obtain.  Patient is confused  Tolerating Diet: npo      DRUG ALLERGIES:   Allergies  Allergen Reactions  . Atorvastatin Other (See Comments)    Myositis  . Bisphosphonates Other (See Comments)    GI upset  . Codeine Other (See Comments)  . Memantine Other (See Comments)  . Nsaids Other (See Comments)    GI upset  . Omeprazole Nausea Only  . Other Other (See Comments)    Leg cramps GI upset  . Salsalate Other (See Comments)  . Sulfa Antibiotics     Pt doesn't remember  . Tamoxifen Other (See Comments)    Leg cramps  . Penicillins Itching    Has patient had a PCN reaction causing immediate rash, facial/tongue/throat swelling, SOB or lightheadedness with hypotension: Yes Has patient had a PCN reaction causing severe rash involving mucus membranes or skin necrosis: No Has patient had a PCN reaction that required hospitalization: No Has patient had a PCN reaction occurring within the last 10 years: No If all of the above answers are "NO", then may proceed with Cephalosporin use.    VITALS:  Blood pressure (!) 217/102, pulse (!) 104, temperature 97.9 F (36.6 C), temperature source Axillary, resp. rate 19, height 5\' 5"  (1.651 m), weight 65 kg, SpO2 97 %.  PHYSICAL EXAMINATION:  Constitutional: Appears Frail not in distress  hENT: Normocephalic. Marland Kitchen Oropharynx dry   eyes:  no scleral icterus.  Neck: Normal ROM. Neck supple. No JVD. No tracheal deviation. CVS: RRR, S1/S2 +, no murmurs, no gallops, no carotid bruit.  Pulmonary: Effort and breath sounds normal, no stridor, rhonchi, wheezes, rales.  Abdominal: Soft. BS +,  no distension, tenderness, rebound or guarding.  Musculoskeletal: She moves all extremities.  No edema of lower extremity.    Neuro: Alert.   Confused  sSkin: Skin is warm and dry. No rash noted. Psychiatric: Confused     LABORATORY PANEL:   CBC Recent Labs  Lab 07/10/18 0743  WBC 19.2*  HGB 16.4*  HCT 47.9*  PLT 228   ------------------------------------------------------------------------------------------------------------------  Chemistries  Recent Labs  Lab 07/10/18 0743 07/11/18 0355  NA 139  --   K 3.3* 3.8  CL 102  --   CO2 23  --   GLUCOSE 175*  --   BUN 12  --   CREATININE 0.66  --   CALCIUM 9.1  --   AST 51*  --   ALT 30  --   ALKPHOS 78  --   BILITOT 1.3*  --    ------------------------------------------------------------------------------------------------------------------  Cardiac Enzymes No results for input(s): TROPONINI in the last 168 hours. ------------------------------------------------------------------------------------------------------------------  RADIOLOGY:  Ct Head Wo Contrast  Result Date: 07/10/2018 CLINICAL DATA:  Seizure following fall EXAM: CT HEAD WITHOUT CONTRAST TECHNIQUE: Contiguous axial images were obtained from the base of the skull through the vertex without intravenous contrast. COMPARISON:  March 18, 2017 FINDINGS: Brain: Mild to moderate diffuse atrophy is stable. There is no intracranial mass, hemorrhage, extra-axial fluid collection, or midline shift. There is small vessel disease throughout the centra semiovale bilaterally. Small vessel disease is noted in the anterior limbs of each internal and external capsule, stable. There is a small infarct in the posterior inferior to midportion of the right cerebellum,  stable. No acute infarct is evident. Vascular: There is no hyperdense vessel. There is calcification in each distal vertebral artery as well as in each carotid siphon region. Skull: The bony calvarium appears intact. Sinuses/Orbits: There is mucosal thickening in several ethmoid air cells. Other visualized paranasal sinuses are clear. Visualized orbits  appear symmetric bilaterally. Other: Visualized mastoid air cells are clear. IMPRESSION: Stable atrophy with supratentorial small vessel disease. 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 No acute infarct evident. No mass or hemorrhage. There are foci of arterial vascular calcification. There is mucosal thickening in several ethmoid air cells. Electronically Signed   By: Lowella Grip III M.D.   On: 07/10/2018 08:32   Dg Chest Port 1 View  Result Date: 07/10/2018 CLINICAL DATA:  Altered mental status.  Recent fall. EXAM: PORTABLE CHEST 1 VIEW COMPARISON:  April 09, 2017 FINDINGS: There is no edema or consolidation. Heart is mildly enlarged with pulmonary vascularity normal. No adenopathy. There is aortic atherosclerosis. No pneumothorax. No acute fracture evident. There is an old healed fracture of the lateral left sixth and seventh ribs. Bones are osteoporotic. IMPRESSION: No edema or consolidation. Stable cardiac prominence. Bones osteoporotic. Aortic Atherosclerosis (ICD10-I70.0). Electronically Signed   By: Lowella Grip III M.D.   On: 07/10/2018 08:57     ASSESSMENT AND PLAN:  85 year old female with history of end-stage dementia who is a resident at [facility] who presents after a seizure.  1.  Seizure: EEG is consistent with seizure  Continue seizure precautions and follow-up on neurology consult. Patient currently on Keppra  2.  Hypokalemia: Repleted  3.  Elevated white blood cell count in the setting of seizure which is stress reaction  4.  Dementia: Patient appears to be at baseline.  5.  Hypertension: Holding p.o. medications Continue PRN hydralazine  Appreciate palliative care services.  Patient with overall very poor prognosis given end-stage dementia.  Would recommend transitioning to full comfort care and hospice.      CODE STATUS: dnr  TOTAL TIME TAKING CARE OF THIS PATIENT: 30 minutes.     POSSIBLE D/C TBD, DEPENDING ON CLINICAL  CONDITION.   [name] M.D on 07/11/2018 at 9:55 AM  Between 7am to 6pm - Pager - [phone] After 6pm go to [email or web address] - password [email or web address] [place]  Office  [phone]  CC: Primary care physician; Marisa Hua, MD  Note: This dictation was prepared with Dragon dictation along with smaller phrase technology. Any transcriptional errors that result from this process are unintentional.

## 2018-07-11 NOTE — Progress Notes (Signed)
Initial Nutrition Assessment  DOCUMENTATION CODES:   Not applicable  INTERVENTION:  No appropriate nutrition intervention at this time.  Will monitor for outcome of discussions regarding goals of care.  NUTRITION DIAGNOSIS:   Inadequate oral intake related to inability to eat as evidenced by NPO status.  GOAL:   Patient will meet greater than or equal to 90% of their needs  MONITOR:   Diet advancement, PO intake, Labs, Weight trends, Skin, I & O's  REASON FOR ASSESSMENT:   Malnutrition Screening Tool    ASSESSMENT:   85 year old female with PMHx of breast cancer, HTN, HLD, GERD, dementia, OA, hx DVT admitted after a seizure.   Patient unable to provide history at this time. She is currently NPO. Plan is for a trial of IV fluids and antibiotics for 24 hours and then transition to comfort care if patient does not improve. Weight appears stable in chart.  Medications reviewed and include: NS @ 75 mL/hr, Keppra.  Labs reviewed: Potassium 3.3.   NUTRITION - FOCUSED PHYSICAL EXAM:  Unable to complete at this time.  Diet Order:   Diet Order            Diet NPO time specified  Diet effective now             EDUCATION NEEDS:   Not appropriate for education at this time  Skin:  Skin Assessment: Reviewed RN Assessment(ecchymosis)  Last BM:  07/10/2018 - large type 6  Height:   Ht Readings from Last 1 Encounters:  07/10/18 5\' 5"  (1.651 m)   Weight:   Wt Readings from Last 1 Encounters:  07/10/18 65 kg   Ideal Body Weight:  56.8 kg  BMI:  Body mass index is 23.85 kg/m.  Estimated Nutritional Needs:   Kcal:  1600-1800  Protein:  80-90 grams  Fluid:  1.6-1.8 L/day  Willey Blade, MS, RD, LDN Office: [phone] Pager: [phone] After Hours/Weekend Pager: [phone]

## 2018-07-12 LAB — POTASSIUM: Potassium: 3.3 mmol/L — ABNORMAL LOW (ref 3.5–5.1)

## 2018-07-12 MED ORDER — POLYVINYL ALCOHOL 1.4 % OP SOLN
2.0000 [drp] | OPHTHALMIC | Status: DC | PRN
  Filled 2018-07-12: qty 15

## 2018-07-12 MED ORDER — POTASSIUM CHLORIDE 10 MEQ/100ML IV SOLN
10.0000 meq | INTRAVENOUS | Status: DC
  Administered 2018-07-12 (×4): 10 meq via INTRAVENOUS
  Filled 2018-07-12 (×4): qty 100

## 2018-07-12 MED ORDER — SODIUM CHLORIDE 0.9% FLUSH: 3.0000 mL | Freq: Two times a day (BID) | INTRAVENOUS | Status: DC

## 2018-07-12 MED ORDER — MORPHINE SULFATE (CONCENTRATE) 10 MG/0.5ML PO SOLN: 2.6000 mg | ORAL | Status: DC | PRN

## 2018-07-12 MED ORDER — SODIUM CHLORIDE 0.9 % IV SOLN: 250.0000 mL | INTRAVENOUS | Status: DC | PRN

## 2018-07-12 MED ORDER — SODIUM CHLORIDE 0.9% FLUSH: 3.0000 mL | INTRAVENOUS | Status: DC | PRN

## 2018-07-12 MED ORDER — LORAZEPAM 2 MG/ML PO CONC: 1.0000 mg | ORAL | 0 refills | Status: AC | PRN

## 2018-07-12 MED ORDER — MORPHINE SULFATE (CONCENTRATE) 10 MG/0.5ML PO SOLN: 2.6000 mg | ORAL | Status: AC | PRN

## 2018-07-12 MED ORDER — LORAZEPAM 2 MG/ML PO CONC: 1.0000 mg | ORAL | Status: DC | PRN

## 2018-07-12 NOTE — Progress Notes (Signed)
Patient discharged to hospice home per MD order. EMS called for transport.

## 2018-07-12 NOTE — Progress Notes (Signed)
New referral for [name] [name] home received following a  Palliative medicine follow up visit. Patient is an 85 year old woman admitted to [facility] on 5/13 from [facility] for evaluation of possible seizure activity. She has been seen by neurology and followed by Palliative Medicine. She was started on IV anti-seizure medications and IV fluids, but has remained lethargic, not following commands, some agitation yesterday. Palliative Medicine NP [name] [name] has spoken to patient's neice [name] [name], who has chosen to focus on comfort with transfer to the hospice home. Patient seen lying in bed, did not open eyes to name, appears frail and thin. Writer spoke to [name] to initiate education regarding hospice services, philosophy and team approach to care with understanding voiced. Consents to be done with hospice social worker [name] [name]. Plan is for discharge today, via EMS with signed DNR in place. Hospital care team updated. Patient information faxed to referral, report called to the hospice home.Thank you. [name] [name] BSN, RN, [facility]  [phone]

## 2018-07-12 NOTE — Care Management Important Message (Signed)
Important Message  Patient Details  Name: [name] [name] MRN: [id number] Date of Birth: [birth year]   Medicare Important Message Given:  Yes    Juliann Pulse [place] 07/12/2018, 10:51 AM

## 2018-07-12 NOTE — Progress Notes (Signed)
Palliative:   [name] [name] is lying quietly in bed.  She appears quite frail.  She will open her eyes when I rub her chest, but not make eye contact.  She does not respond in any way to my questions.  There is no family at bedside at this time due to his restrictions.  Bedside nursing staff share that they could not get [name] [name] to interact with them in any meaningful way, take any medications.  Call to [name] at [phone].  Update page on my interactions with [name] this morning.   [name] [name] tells me that she has spoken with [facility] who shared that [name] [name] had exhibited some behavior changes/decreased level of consciousness prior to this illness.  [name] [name] shares she considers that another event happened prior to her fall/seizures.  We talked about what is next.  We talk abut modern medicine prolonging life, but also prolonging the dying process and thereby prolonging suffering. We discuss the benefits of residential hospice vs hospice service at [facility]. We talked about un burdening her from medications and treatments that are painful/not changing what is happening. [name] [name] is tearful, telling me that her main goal is for [name] to not suffer.    Conference with hospitalist, social work, and hospice liaison to update on goals of care.  Assessment:  No acute distress, appears frail.  Will open eyes but not make eye contact.    Unable to make her basic needs known.  Prognosis:  2 weeks or less expected based on continued declines, no desire to eat or drink, poor functional status, inability to maintain electrolyte balance, and family's desire to focus on comfort and dignity, let nature take its course.  Plan:   Requesting residential hospice at [facility] for comfort and dignity at end-of-life, let nature take its course. Full comfort care [name], [phone], is main contact.   65 minutes, extended time    [name] Axe, NP  Palliative Medicine Team   Team Phone # [phone]  Greater than 50% of this time was spent counseling and coordinating care related to the above assessment and plan.

## 2018-07-12 NOTE — TOC Transition Note (Signed)
Transition of Care [facility]) - CM/SW Discharge Note   Patient Details  Name: [name] [name] MRN: [id number] Date of Birth: 07-15-1930  Transition of Care [facility]) CM/SW Contact:  Annamaria Boots, Story Phone Number: 07/12/2018, 2:13 PM   Clinical Narrative:   Patient is a long term resident at [facility]. CSW notified by Palliative NP that patient's family would like her to go to hospice home. CSW spoke with patient's niece and she confirms that patient should be discharged to hospice home. Niece would like to use [facility]. Santiago Glad with [place] aware of discharge plan. Patient will discharge today to hospice home. Santiago Glad will call report and call for transport.     Final next level of care: [place] Barriers to Discharge: No Barriers Identified   Patient Goals and CMS Choice Patient states their goals for this hospitalization and ongoing recovery are:: comfort care  CMS Medicare.gov Compare Post Acute Care list provided to:: Patient Represenative (must comment)([name] Nyoka Cowden- Niece ) Choice offered to / list presented to : (Niece )  Discharge Placement                       Discharge Plan and Services     Post Acute Care Choice: Hospice                               Social Determinants of Health (SDOH) Interventions     Readmission Risk Interventions No flowsheet data found.

## 2018-07-12 NOTE — Consult Note (Signed)
PHARMACY CONSULT NOTE - FOLLOW UP  Pharmacy Consult for Electrolyte Monitoring and Replacement   Recent Labs: Potassium (mmol/L)  Date Value  07/12/2018 3.3 (L)  10/01/2013 3.8   Magnesium (mg/dL)  Date Value  02/25/2018 2.2  05/04/2012 1.5 (L)   Calcium (mg/dL)  Date Value  07/10/2018 9.1   Calcium, Total (mg/dL)  Date Value  10/01/2013 8.6   Albumin (g/dL)  Date Value  07/10/2018 4.4  05/04/2012 2.7 (L)   Sodium (mmol/L)  Date Value  07/10/2018 139  10/01/2013 138     Assessment: Patient is mildly hypokalemic with K = 3.3  Goal of Therapy:  K wnl's  Plan:  Will give KCL IV 22meq x 4 and will reassess with am labs  Lu Duffel, PharmD, BCPS Clinical Pharmacist 07/12/2018 8:20 AM

## 2018-07-12 NOTE — Progress Notes (Signed)
[place] at [place] NAME: [name] [name]    MR#:  [id number]  DATE OF BIRTH:  September 25, 1930  SUBJECTIVE:   Patient here with seizures currently on Keppra  REVIEW OF SYSTEMS:  Unable to obtain.  Patient is lethargic    tolerating Diet: npo      DRUG ALLERGIES:   Allergies  Allergen Reactions  . Atorvastatin Other (See Comments)    Myositis  . Bisphosphonates Other (See Comments)    GI upset  . Codeine Other (See Comments)  . Memantine Other (See Comments)  . Nsaids Other (See Comments)    GI upset  . Omeprazole Nausea Only  . Other Other (See Comments)    Leg cramps GI upset  . Salsalate Other (See Comments)  . Sulfa Antibiotics     Pt doesn't remember  . Tamoxifen Other (See Comments)    Leg cramps  . Penicillins Itching    Has patient had a PCN reaction causing immediate rash, facial/tongue/throat swelling, SOB or lightheadedness with hypotension: Yes Has patient had a PCN reaction causing severe rash involving mucus membranes or skin necrosis: No Has patient had a PCN reaction that required hospitalization: No Has patient had a PCN reaction occurring within the last 10 years: No If all of the above answers are "NO", then may proceed with Cephalosporin use.    VITALS:  Blood pressure (!) 169/64, pulse 80, temperature 97.7 F (36.5 C), temperature source Axillary, resp. rate 18, height 5\' 5"  (1.651 m), weight 65 kg, SpO2 98 %.  PHYSICAL EXAMINATION:  Constitutional: Appears Frail not in distress  Chronically ill appearing hENT: Normocephalic. Marland Kitchen Oropharynx dry   eyes:  no scleral icterus.  Neck: Normal ROM. Neck supple. No JVD. No tracheal deviation. CVS: RRR, S1/S2 +, no murmurs, no gallops, no carotid bruit.  Pulmonary: Normal respiratory effort with bilateral rhonchi  Abdominal: Soft. BS +,  no distension, tenderness, rebound or guarding.  Musculoskeletal: Withdraws from pain moves extremities  spontaneously  Neuro:lethargic sSkin: Skin is warm and dry. No rash noted. Psychiatric: lethargic     LABORATORY PANEL:   CBC Recent Labs  Lab 07/10/18 0743  WBC 19.2*  HGB 16.4*  HCT 47.9*  PLT 228   ------------------------------------------------------------------------------------------------------------------  Chemistries  Recent Labs  Lab 07/10/18 0743  07/12/18 0637  NA 139  --   --   K 3.3*   < > 3.3*  CL 102  --   --   CO2 23  --   --   GLUCOSE 175*  --   --   BUN 12  --   --   CREATININE 0.66  --   --   CALCIUM 9.1  --   --   AST 51*  --   --   ALT 30  --   --   ALKPHOS 78  --   --   BILITOT 1.3*  --   --    < > = values in this interval not displayed.   ------------------------------------------------------------------------------------------------------------------  Cardiac Enzymes No results for input(s): TROPONINI in the last 168 hours. ------------------------------------------------------------------------------------------------------------------  RADIOLOGY:  No results found.   ASSESSMENT AND PLAN:  85 year old female with history of end-stage dementia who is a resident at [facility] who presents after a seizure.  1.  Seizure: EEG is consistent with seizure  Continue seizure precautions  Continue Keppra as per recommendations by neurology Evaded lactic acid due to seizure 2.  Hypokalemia: Replete as per pharmacy protocol  3.  Elevated white blood cell count in the setting of seizure which is stress reaction  4.  Dementia: Patient appears to be at baseline.  5.  Hypertension: Holding p.o. medications Continue PRN hydralazine  Appreciate palliative care services.  Patient with overall very poor prognosis given end-stage dementia.  Would recommend transitioning to full comfort care and hospice.  Family meeting today   Cussed with nursing CODE STATUS: dnr  TOTAL TIME TAKING CARE OF THIS PATIENT: 21 minutes.     POSSIBLE  D/C TBD, DEPENDING ON CLINICAL CONDITION.   [name] M.D on 07/12/2018 at 9:50 AM  Between 7am to 6pm - Pager - [phone] After 6pm go to [email or web address] - password [email or web address] [place] [facility]  Office  [phone]  CC: Primary care physician; Marisa Hua, MD  Note: This dictation was prepared with Dragon dictation along with smaller phrase technology. Any transcriptional errors that result from this process are unintentional.

## 2018-07-12 NOTE — Progress Notes (Signed)
Subjective: No improvement. Not following commands.   Past Medical History:  Diagnosis Date  . breast cancer   . DVT (deep venous thrombosis) ([place]) 06/2005   RLE ; treated x 3 months with Coumadin  . Fibrocystic breast disease    Severe; status post bilateral mastectomies and implants  . GERD (gastroesophageal reflux disease)    Severe; manifested vocal cord irritation, status-post Nissen fundcoplication in 0/1027  . History of ataxia    with questionable right upper and lower  . History of breast cancer   . History of exertional chest pain    with her last stress echo in 09/1999 being normal  . History of migraine    vascular  . History of shingles   . History of syncope 12/1999   With negative cardiac workup to include an echocardiogram, which revealed only mild to moderate MR, a CT scan of the chest, which was negative, and a Cardiolite stress test, which was normal  . Hyperlipidemia, unspecified   . Hypertension    CTA 05/2004 showed no renovascular cause  . Mild dementia ([place])   . Muscle atrophy    Positive FANA and anti- SCL70 antibodies; S/p biopsy without eosinic fascitis; Remicade discontinued  . Osteoarthritis    a. Cervical spine. b. Lumbar spine/spinal stenosis. [facility])  . Osteoporosis    a. Fosamax b. Right wrist fracture. c. Sternal fracture ([place])  . Rheumatoid arthritis without elevated rheumatoid factor (HCC)    a. Methotrexate. b. S/p Remicade. c. Seronegative. d. S/p Plaquenil. [facility])    Past Surgical History:  Procedure Laterality Date  . APPENDECTOMY    . CHOLECYSTECTOMY    . IRRIGATION AND DEBRIDEMENT ABSCESS Right 04/08/2017   Procedure: IRRIGATION AND DEBRIDEMENT ABSCESS with wound vac;  Surgeon: Albertine Patricia, DPM;  Location: [facility] ORS;  Service: Podiatry;  Laterality: Right;  . LAPAROSCOPIC NISSEN FUNDOPLICATION  25/3664  . LUMBAR LAMINECTOMY     for spinal stenosis  . MASTECTOMY Bilateral    with implants  . ORIF DISTAL RADIUS FRACTURE Right  07/27/2008   with volar plate  . TOTAL ABDOMINAL HYSTERECTOMY W/ BILATERAL SALPINGOOPHORECTOMY    . VASCULAR SURGERY      Family History  Problem Relation Age of Onset  . Heart failure Mother   . Hypertension Mother   . Cancer Father   . Heart attack Sister     Social History:  reports that she has never smoked. She has never used smokeless tobacco. She reports that she does not drink alcohol or use drugs.  Allergies  Allergen Reactions  . Atorvastatin Other (See Comments)    Myositis  . Bisphosphonates Other (See Comments)    GI upset  . Codeine Other (See Comments)  . Memantine Other (See Comments)  . Nsaids Other (See Comments)    GI upset  . Omeprazole Nausea Only  . Other Other (See Comments)    Leg cramps GI upset  . Salsalate Other (See Comments)  . Sulfa Antibiotics     Pt doesn't remember  . Tamoxifen Other (See Comments)    Leg cramps  . Penicillins Itching    Has patient had a PCN reaction causing immediate rash, facial/tongue/throat swelling, SOB or lightheadedness with hypotension: Yes Has patient had a PCN reaction causing severe rash involving mucus membranes or skin necrosis: No Has patient had a PCN reaction that required hospitalization: No Has patient had a PCN reaction occurring within the last 10 years: No If all of the above answers are "NO",  then may proceed with Cephalosporin use.    Medications: I have reviewed the patient's current medications.  ROS: Unable to obtain as not following commands   Physical Examination: Blood pressure (!) 169/64, pulse 80, temperature 97.7 F (36.5 C), temperature source Axillary, resp. rate 18, height 5\' 5"  (1.651 m), weight 65 kg, SpO2 98 %.  Pt does withdraw from painful stimuli bilaterally No signs of forced eye deviation Pupils and corneal reactive Cough not tested.   Laboratory Studies:   Basic Metabolic Panel: Recent Labs  Lab 07/10/18 0743 07/11/18 0355 07/12/18 0637  NA 139  --   --   K  3.3* 3.8 3.3*  CL 102  --   --   CO2 23  --   --   GLUCOSE 175*  --   --   BUN 12  --   --   CREATININE 0.66  --   --   CALCIUM 9.1  --   --     Liver Function Tests: Recent Labs  Lab 07/10/18 0743  AST 51*  ALT 30  ALKPHOS 78  BILITOT 1.3*  PROT 7.8  ALBUMIN 4.4   No results for input(s): LIPASE, AMYLASE in the last 168 hours. No results for input(s): AMMONIA in the last 168 hours.  CBC: Recent Labs  Lab 07/10/18 0743  WBC 19.2*  NEUTROABS 17.2*  HGB 16.4*  HCT 47.9*  MCV 111.1*  PLT 228    Cardiac Enzymes: No results for input(s): CKTOTAL, CKMB, CKMBINDEX, TROPONINI in the last 168 hours.  BNP: Invalid input(s): POCBNP  CBG: No results for input(s): GLUCAP in the last 168 hours.  Microbiology: Results for orders placed or performed during the hospital encounter of 07/10/18  SARS Coronavirus 2 (CEPHEID- Performed in [place] hospital lab), Hosp Order     Status: None   Collection Time: 07/10/18  7:43 AM  Result Value Ref Range Status   SARS Coronavirus 2 NEGATIVE NEGATIVE Final    Comment: (NOTE) If result is NEGATIVE SARS-CoV-2 target nucleic acids are NOT DETECTED. The SARS-CoV-2 RNA is generally detectable in upper and lower  respiratory specimens during the acute phase of infection. The lowest  concentration of SARS-CoV-2 viral copies this assay can detect is 250  copies / mL. A negative result does not preclude SARS-CoV-2 infection  and should not be used as the sole basis for treatment or other  patient management decisions.  A negative result may occur with  improper specimen collection / handling, submission of specimen other  than nasopharyngeal swab, presence of viral mutation(s) within the  areas targeted by this assay, and inadequate number of viral copies  (<250 copies / mL). A negative result must be combined with clinical  observations, patient history, and epidemiological information. If result is POSITIVE SARS-CoV-2 target nucleic  acids are DETECTED. The SARS-CoV-2 RNA is generally detectable in upper and lower  respiratory specimens dur ing the acute phase of infection.  Positive  results are indicative of active infection with SARS-CoV-2.  Clinical  correlation with patient history and other diagnostic information is  necessary to determine patient infection status.  Positive results do  not rule out bacterial infection or co-infection with other viruses. If result is PRESUMPTIVE POSTIVE SARS-CoV-2 nucleic acids MAY BE PRESENT.   A presumptive positive result was obtained on the submitted specimen  and confirmed on repeat testing.  While 2019 novel coronavirus  (SARS-CoV-2) nucleic acids may be present in the submitted sample  additional confirmatory testing may  be necessary for epidemiological  and / or clinical management purposes  to differentiate between  SARS-CoV-2 and other Sarbecovirus currently known to infect humans.  If clinically indicated additional testing with an alternate test  methodology [phone]) is advised. The SARS-CoV-2 RNA is generally  detectable in upper and lower respiratory sp ecimens during the acute  phase of infection. The expected result is Negative. Fact Sheet for Patients:  StrictlyIdeas.no Fact Sheet for Healthcare Providers: BankingDealers.co.za This test is not yet approved or cleared by the [place] FDA and has been authorized for detection and/or diagnosis of SARS-CoV-2 by FDA under an Emergency Use Authorization (EUA).  This EUA will remain in effect (meaning this test can be used) for the duration of the COVID-19 declaration under Section 564(b)(1) of the Act, 21 U.S.C. section 360bbb-3(b)(1), unless the authorization is terminated or revoked sooner. Performed at [facility], [address] [place], [place] [address]   MRSA PCR Screening     Status: None   Collection Time: 07/10/18  1:38 PM  Result Value  Ref Range Status   MRSA by PCR NEGATIVE NEGATIVE Final    Comment:        The GeneXpert MRSA Assay (FDA approved for NASAL specimens only), is one component of a comprehensive MRSA colonization surveillance program. It is not intended to diagnose MRSA infection nor to guide or monitor treatment for MRSA infections. Performed at [facility], [address] [place], [place] [address]     Coagulation Studies: Recent Labs    07/10/18 0743  LABPROT 13.9  INR 1.1    Urinalysis:  Recent Labs  Lab 07/10/18 0743  COLORURINE YELLOW*  LABSPEC 1.014  PHURINE 6.0  GLUCOSEU 50*  HGBUR LARGE*  BILIRUBINUR NEGATIVE  KETONESUR 5*  PROTEINUR 100*  NITRITE NEGATIVE  LEUKOCYTESUR NEGATIVE    Lipid Panel:  No results found for: CHOL, TRIG, HDL, CHOLHDL, VLDL, LDLCALC  HgbA1C: No results found for: HGBA1C  Urine Drug Screen:  No results found for: LABOPIA, COCAINSCRNUR, LABBENZ, AMPHETMU, THCU, LABBARB  Alcohol Level: No results for input(s): ETH in the last 168 hours.  Imaging: No results found.   Assessment/Plan:  85 y.o. female with hx of end stage dementia with "MOST" form lives in NH with palliative care following. Last seen normal this AM and apparently because agitated/confused and suspected seizure activity with eyes rolling to left side.  Not following commands   - EEG shows epileptiform potential on L temporal lobe but no true seizures -Keppra to 750 BID.  - Appreciate palliative care following - Family meeting. She has very poor baseline prior to this event.  - Pt is DNR 07/12/2018, 10:51 AM

## 2018-07-12 NOTE — Discharge Summary (Signed)
[place] [facility] [place] NAME: [name] [name]    MR#:  [id number]  DATE OF BIRTH:  1930/03/04  DATE OF ADMISSION:  07/10/2018 ADMITTING PHYSICIAN: Max Sane, MD  DATE OF DISCHARGE: 07/12/2018  PRIMARY CARE PHYSICIAN: Marisa Hua, MD    ADMISSION DIAGNOSIS:  Seizure ([place]) [R56.9] Cerebrovascular accident (CVA), unspecified mechanism ([place]) [I63.9]  DISCHARGE DIAGNOSIS:  Active Problems:   Seizure [facility])   Palliative care by specialist   Advanced care planning/counseling discussion   Goals of care, counseling/discussion   Encounter for hospice care discussion   SECONDARY DIAGNOSIS:   Past Medical History:  Diagnosis Date  . breast cancer   . DVT (deep venous thrombosis) ([place]) 06/2005   RLE ; treated x 3 months with Coumadin  . Fibrocystic breast disease    Severe; status post bilateral mastectomies and implants  . GERD (gastroesophageal reflux disease)    Severe; manifested vocal cord irritation, status-post Nissen fundcoplication in 08/6281  . History of ataxia    with questionable right upper and lower  . History of breast cancer   . History of exertional chest pain    with her last stress echo in 09/1999 being normal  . History of migraine    vascular  . History of shingles   . History of syncope 12/1999   With negative cardiac workup to include an echocardiogram, which revealed only mild to moderate MR, a CT scan of the chest, which was negative, and a Cardiolite stress test, which was normal  . Hyperlipidemia, unspecified   . Hypertension    CTA 05/2004 showed no renovascular cause  . Mild dementia ([place])   . Muscle atrophy    Positive FANA and anti- SCL70 antibodies; S/p biopsy without eosinic fascitis; Remicade discontinued  . Osteoarthritis    a. Cervical spine. b. Lumbar spine/spinal stenosis. [facility]  . Osteoporosis    a. Fosamax b. Right wrist fracture. c. Sternal fracture ([place])  . Rheumatoid arthritis without  elevated rheumatoid factor (HCC)    a. Methotrexate. b. S/p Remicade. c. Seronegative. d. S/p Plaquenil. [facility])    HOSPITAL COURSE:  85 year old female with history of end-stage dementia who is a resident at [facility] who presents after a seizure.  1.  Seizure: EEG is consistent with seizure  2.  Hypokalemia:   3.  Elevated white blood cell count in the setting of seizure which is stress reaction  4.  Dementia:   5.  Hypertension:   Appreciate palliative care services.  Patient with overall very poor prognosis given end-stage dementia. Patient will be going to hospice.   DISCHARGE CONDITIONS AND DIET:  guarded  CONSULTS OBTAINED:  Treatment Team:  Leotis Pain, MD  DRUG ALLERGIES:   Allergies  Allergen Reactions  . Atorvastatin Other (See Comments)    Myositis  . Bisphosphonates Other (See Comments)    GI upset  . Codeine Other (See Comments)  . Memantine Other (See Comments)  . Nsaids Other (See Comments)    GI upset  . Omeprazole Nausea Only  . Other Other (See Comments)    Leg cramps GI upset  . Salsalate Other (See Comments)  . Sulfa Antibiotics     Pt doesn't remember  . Tamoxifen Other (See Comments)    Leg cramps  . Penicillins Itching    Has patient had a PCN reaction causing immediate rash, facial/tongue/throat swelling, SOB or lightheadedness with hypotension: Yes Has patient had a PCN reaction causing severe rash involving mucus membranes  or skin necrosis: No Has patient had a PCN reaction that required hospitalization: No Has patient had a PCN reaction occurring within the last 10 years: No If all of the above answers are "NO", then may proceed with Cephalosporin use.    DISCHARGE MEDICATIONS:   Allergies as of 07/12/2018      Reactions   Atorvastatin Other (See Comments)   Myositis   Bisphosphonates Other (See Comments)   GI upset   Codeine Other (See Comments)   Memantine Other (See Comments)   Nsaids Other (See Comments)   GI  upset   Omeprazole Nausea Only   Other Other (See Comments)   Leg cramps GI upset   Salsalate Other (See Comments)   Sulfa Antibiotics    Pt doesn't remember   Tamoxifen Other (See Comments)   Leg cramps   Penicillins Itching   Has patient had a PCN reaction causing immediate rash, facial/tongue/throat swelling, SOB or lightheadedness with hypotension: Yes Has patient had a PCN reaction causing severe rash involving mucus membranes or skin necrosis: No Has patient had a PCN reaction that required hospitalization: No Has patient had a PCN reaction occurring within the last 10 years: No If all of the above answers are "NO", then may proceed with Cephalosporin use.      Medication List    STOP taking these medications   acetaminophen 650 MG CR tablet Commonly known as:  TYLENOL   amLODipine 10 MG tablet Commonly known as:  NORVASC   aspirin EC 81 MG tablet   cyanocobalamin 1000 MCG tablet   docusate sodium 100 MG capsule Commonly known as:  COLACE   folic acid 1 MG tablet Commonly known as:  FOLVITE   isosorbide mononitrate 30 MG 24 hr tablet Commonly known as:  IMDUR   losartan 100 MG tablet Commonly known as:  COZAAR   methotrexate 2.5 MG tablet Commonly known as:  RHEUMATREX   metoprolol tartrate 100 MG tablet Commonly known as:  LOPRESSOR   Multi-Vitamins Tabs   sertraline 100 MG tablet Commonly known as:  ZOLOFT   vitamin C 1000 MG tablet   Vitamin D 125 MCG (5000 UT) Caps     TAKE these medications   LORazepam 2 MG/ML concentrated solution Commonly known as:  ATIVAN Take 0.5 mLs (1 mg total) by mouth every 4 (four) hours as needed for anxiety or seizure (EOL care).   morphine CONCENTRATE 10 MG/0.5ML Soln concentrated solution Take 0.13-0.25 mLs (2.6-5 mg total) by mouth every 2 (two) hours as needed for moderate pain or shortness of breath (EOL care).         Today   CHIEF COMPLAINT:     VITAL SIGNS:  Blood pressure (!) 169/64, pulse  80, temperature 97.7 F (36.5 C), temperature source Axillary, resp. rate 18, height 5\' 5"  (1.651 m), weight 65 kg, SpO2 98 %.   REVIEW OF SYSTEMS:  Review of Systems  Unable to perform ROS: Dementia     PHYSICAL EXAMINATION:  GENERAL:  85 y.o.-year-old patient lying in the bed lethargic   Critically ill appearng DATA  :   CBC Recent Labs  Lab 07/10/18 0743  WBC 19.2*  HGB 16.4*  HCT 47.9*  PLT 228    Chemistries  Recent Labs  Lab 07/10/18 0743  07/12/18 0637  NA 139  --   --   K 3.3*   < > 3.3*  CL 102  --   --   CO2 23  --   --  GLUCOSE 175*  --   --   BUN 12  --   --   CREATININE 0.66  --   --   CALCIUM 9.1  --   --   AST 51*  --   --   ALT 30  --   --   ALKPHOS 78  --   --   BILITOT 1.3*  --   --    < > = values in this interval not displayed.    Cardiac Enzymes No results for input(s): TROPONINI in the last 168 hours.  Microbiology Results  @MICRORSLT48 @  RADIOLOGY:  No results found.    Allergies as of 07/12/2018      Reactions   Atorvastatin Other (See Comments)   Myositis   Bisphosphonates Other (See Comments)   GI upset   Codeine Other (See Comments)   Memantine Other (See Comments)   Nsaids Other (See Comments)   GI upset   Omeprazole Nausea Only   Other Other (See Comments)   Leg cramps GI upset   Salsalate Other (See Comments)   Sulfa Antibiotics    Pt doesn't remember   Tamoxifen Other (See Comments)   Leg cramps   Penicillins Itching   Has patient had a PCN reaction causing immediate rash, facial/tongue/throat swelling, SOB or lightheadedness with hypotension: Yes Has patient had a PCN reaction causing severe rash involving mucus membranes or skin necrosis: No Has patient had a PCN reaction that required hospitalization: No Has patient had a PCN reaction occurring within the last 10 years: No If all of the above answers are "NO", then may proceed with Cephalosporin use.      Medication List    STOP taking these  medications   acetaminophen 650 MG CR tablet Commonly known as:  TYLENOL   amLODipine 10 MG tablet Commonly known as:  NORVASC   aspirin EC 81 MG tablet   cyanocobalamin 1000 MCG tablet   docusate sodium 100 MG capsule Commonly known as:  COLACE   folic acid 1 MG tablet Commonly known as:  FOLVITE   isosorbide mononitrate 30 MG 24 hr tablet Commonly known as:  IMDUR   losartan 100 MG tablet Commonly known as:  COZAAR   methotrexate 2.5 MG tablet Commonly known as:  RHEUMATREX   metoprolol tartrate 100 MG tablet Commonly known as:  LOPRESSOR   Multi-Vitamins Tabs   sertraline 100 MG tablet Commonly known as:  ZOLOFT   vitamin C 1000 MG tablet   Vitamin D 125 MCG (5000 UT) Caps     TAKE these medications   LORazepam 2 MG/ML concentrated solution Commonly known as:  ATIVAN Take 0.5 mLs (1 mg total) by mouth every 4 (four) hours as needed for anxiety or seizure (EOL care).   morphine CONCENTRATE 10 MG/0.5ML Soln concentrated solution Take 0.13-0.25 mLs (2.6-5 mg total) by mouth every 2 (two) hours as needed for moderate pain or shortness of breath (EOL care).         Patient should follow up with hospice**  CODE STATUS:     Code Status Orders  (From admission, onward)         Start     Ordered   07/10/18 1246  Do not attempt resuscitation (DNR)  Continuous    Question Answer Comment  In the event of cardiac or respiratory ARREST Do not call a "code blue"   In the event of cardiac or respiratory ARREST Do not perform Intubation, CPR, defibrillation or ACLS  In the event of cardiac or respiratory ARREST Use medication by any route, position, wound care, and other measures to relive pain and suffering. May use oxygen, suction and manual treatment of airway obstruction as needed for comfort.      07/10/18 1245        Code Status History    Date Active Date Inactive Code Status Order ID Comments User Context   07/10/2018 3557 07/10/2018 1245 DNR  [id number]  Max Sane, MD Inpatient   04/09/2017 1406 04/10/2017 2240 DNR [id number]  Raquel James, RN Inpatient   04/06/2017 1549 04/09/2017 1406 Full Code [id number]  Gorden Harms, MD Inpatient   03/26/2017 1319 04/03/2017 2042 DNR [id number]  Hillary Bow, MD ED   11/09/2015 1304 11/10/2015 1805 Full Code [id number]  Fritzi Mandes, MD Inpatient    Advance Directive Documentation     Most Recent Value  Type of Advance Directive  Out of facility DNR (pink MOST or yellow form)  Pre-existing out of facility DNR order (yellow form or pink MOST form)  Pink MOST form placed in chart (order not valid for inpatient use), Yellow form placed in chart (order not valid for inpatient use)  "MOST" Form in Place?  -      TOTAL TIME TAKING CARE OF THIS PATIENT: 33 minutes.    Note: This dictation was prepared with Dragon dictation along with smaller phrase technology. Any transcriptional errors that result from this process are unintentional.  [name] M.D on 07/12/2018 at 3:36 PM  Between 7am to 6pm - Pager - [phone] After 6pm go to [email or web address] - password [email or web address] [facility]  Office  [phone]  CC: Primary care physician; Marisa Hua, MD

## 2018-07-29 DEATH — deceased

## 2019-04-06 IMAGING — MR MR FOOT*R* W/O CM
5 of 6 series · 33 of 40 positions shown · non-contrast
Comparison: None.

CLINICAL DATA: Cellulitis of the toe.  Pain.

EXAM:
MRI OF THE RIGHT FOREFOOT WITHOUT CONTRAST
TECHNIQUE: Multiplanar, multisequence MR imaging of the right forefoot was
performed. No intravenous contrast was administered.

[Series 5: T1 · coronal · 3.0mm · 0.50mm/px · 8 of 40 slices shown (1 of 2)]
[im 1/40]
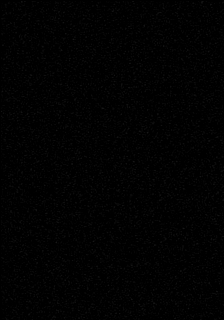
[im 6/40]
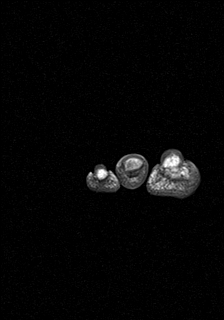
[im 12/40]
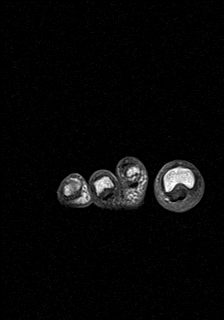
[im 17/40]
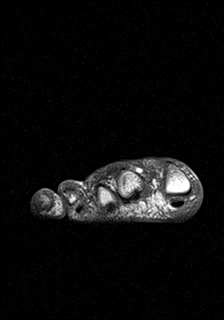
[im 23/40]
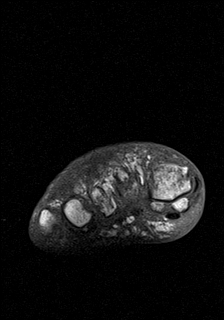
[im 28/40]
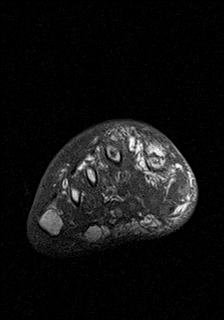
[im 34/40]
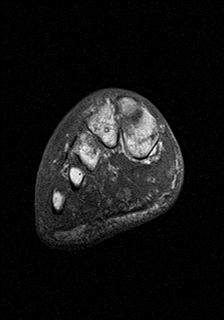
[im 40/40]
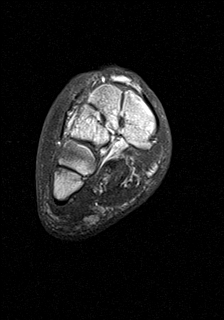

[Series 6: T2 fat-sat · coronal · 3.0mm · 0.31mm/px · 8 of 38 slices shown (1 of 3)]
[im 1/38]
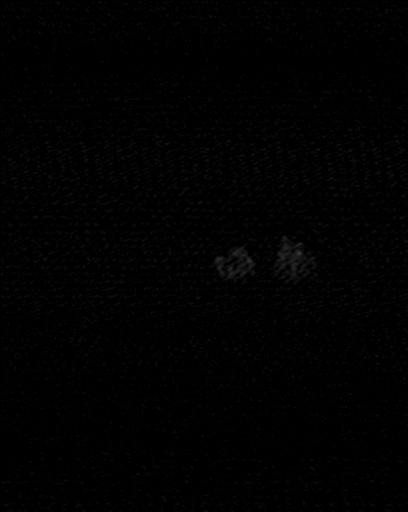
[im 6/38]
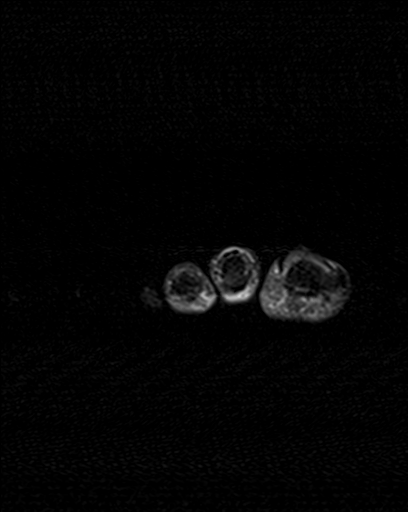
[im 11/38]
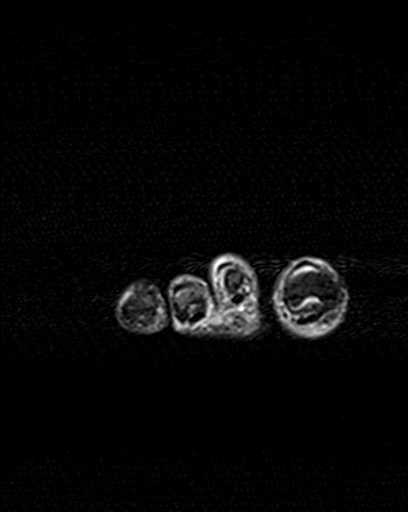
[im 16/38]
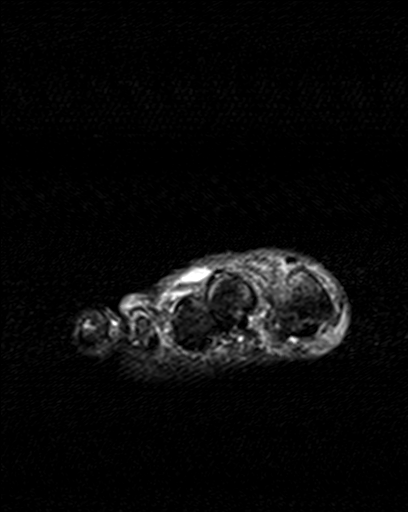
[im 22/38]
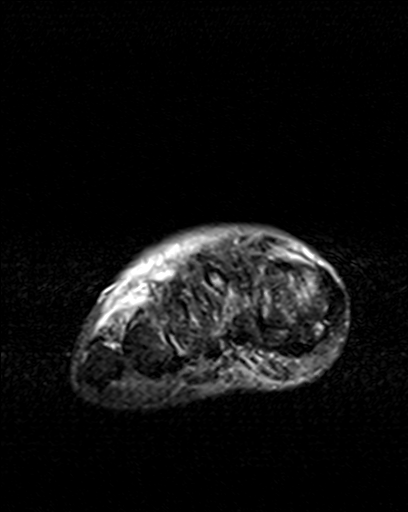
[im 27/38]
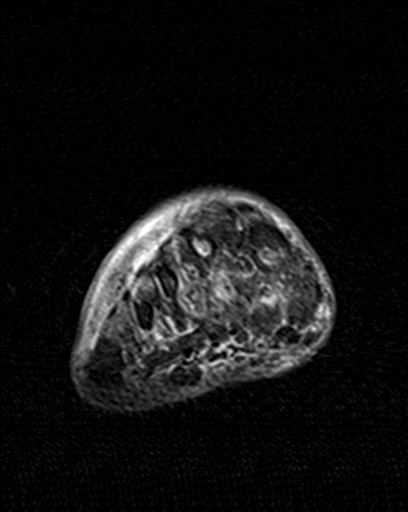
[im 32/38]
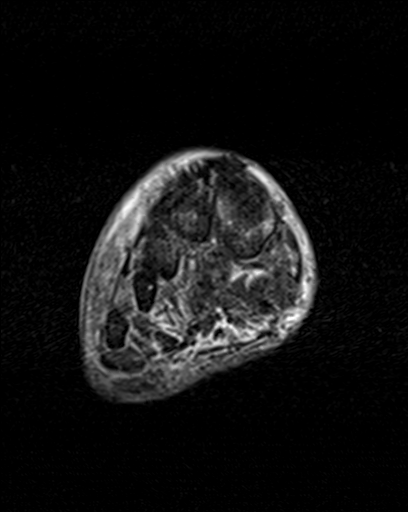
[im 38/38]
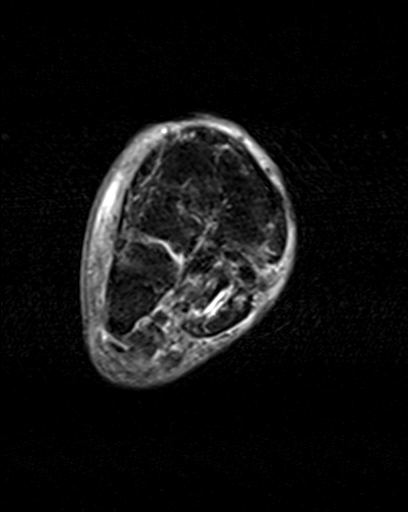

[Series 8: T1 · axial · 3.0mm · 0.53mm/px · z∈[-151,-93]mm · 4 of 27 slices shown (2 of 2)]
[im 1/27]
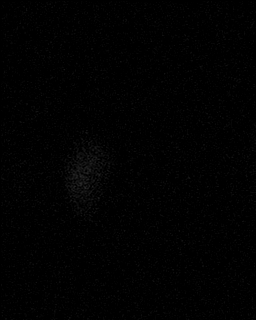
[im 7/27]
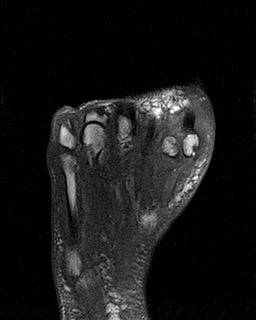
[im 14/27]
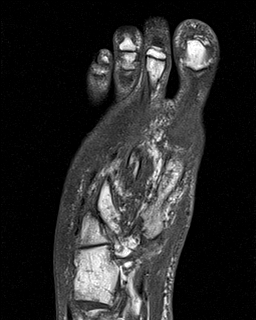
[im 20/27]
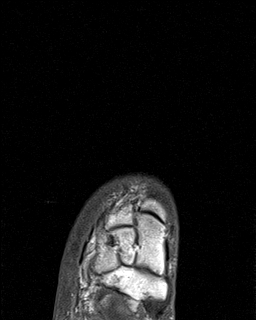

[Series 9: T2 fat-sat · coronal · 3.0mm · 0.31mm/px · 8 of 40 slices shown (2 of 3)]
[im 1/40]
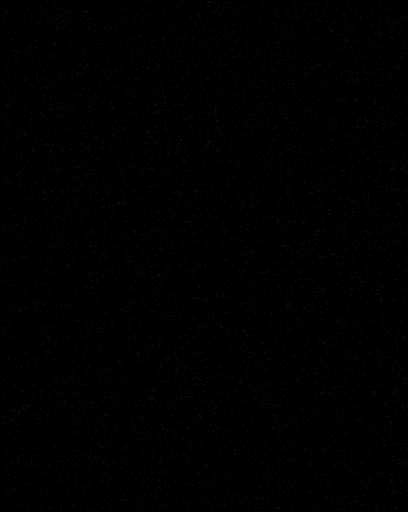
[im 6/40]
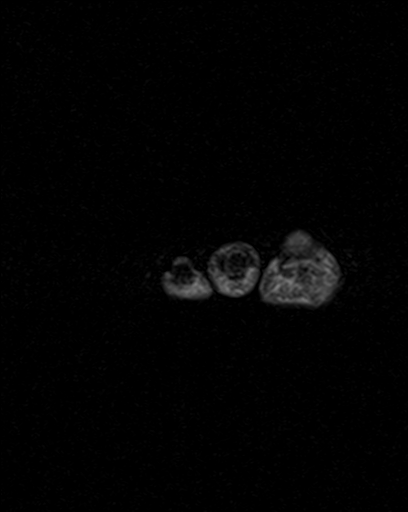
[im 12/40]
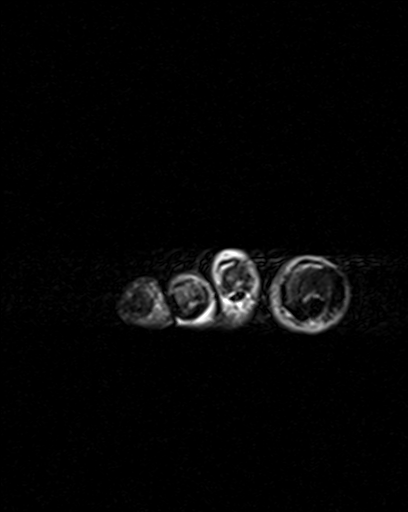
[im 17/40]
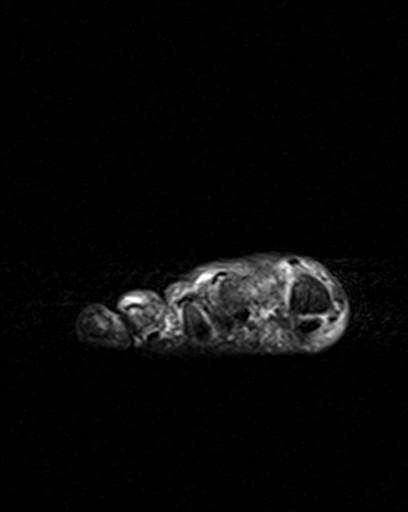
[im 23/40]
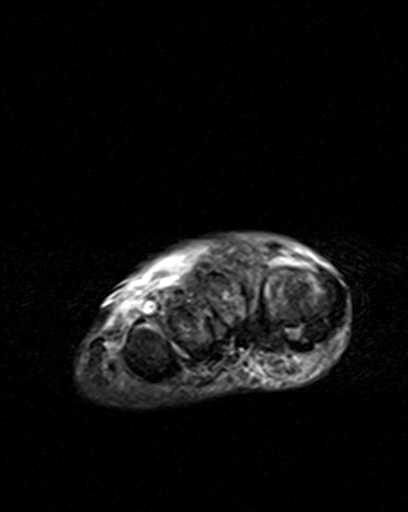
[im 28/40]
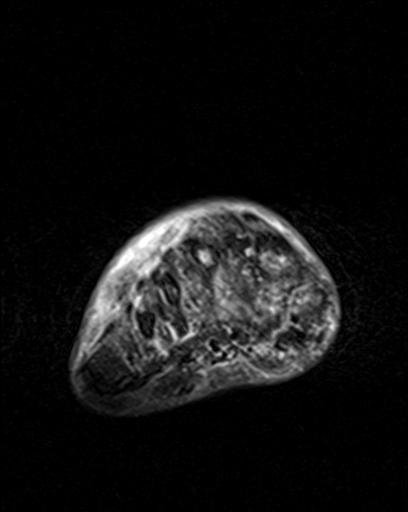
[im 34/40]
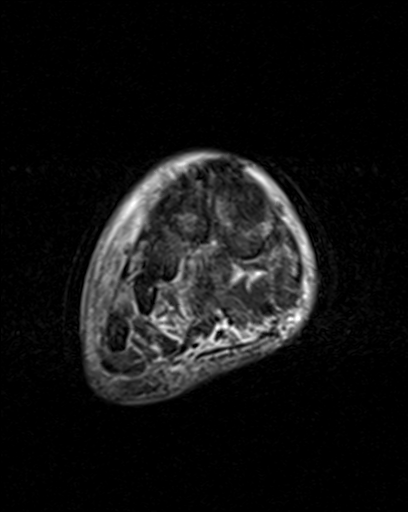
[im 40/40]
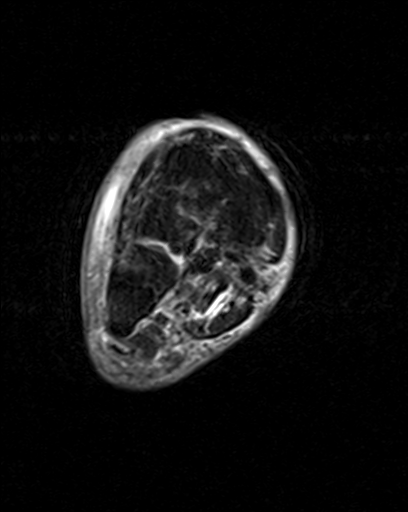

[Series 11: T2 fat-sat · axial · 3.0mm · 0.44mm/px · z∈[-151,-71]mm · 5 of 27 slices shown (3 of 3)]
[im 1/27]
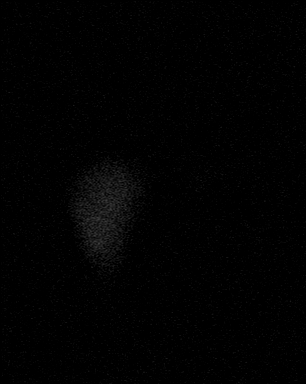
[im 7/27]
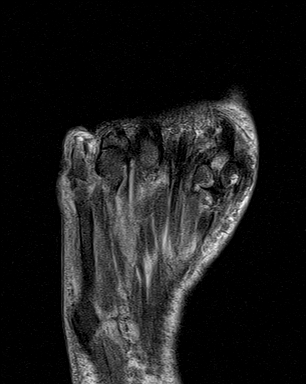
[im 14/27]
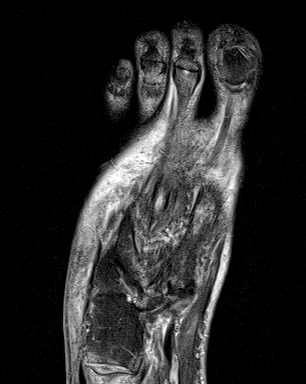
[im 20/27]
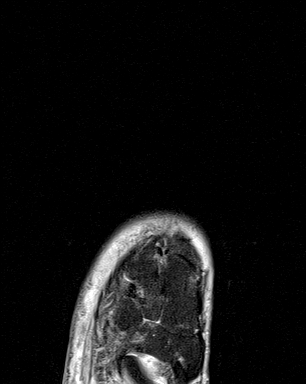
[im 27/27]
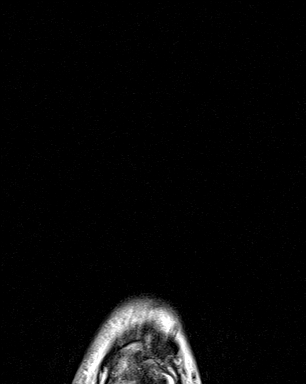

[33 of 40 positions shown; findings below may reference images not displayed]

FINDINGS: Bones/Joint/Cartilage

Nondisplaced fracture at the lateral base of the first proximal
phalanx with mild surrounding marrow edema.

Nondisplaced oblique fracture of the distal shaft of the first
metatarsal with surrounding marrow edema.

Nondisplaced fracture of the second metatarsal neck with marrow
edema in the second metatarsal shaft and second metatarsal head.

Nondisplaced fracture of third metatarsal head.

Severe marrow edema with a linear component involving the distal
anterior calcaneus adjacent to the calcaneocuboid joint partially
visualized most consistent with a nondisplaced fracture.

No periosteal reaction or bone destruction. Normal alignment. No
joint effusion.

Ligaments

Collateral ligaments are intact.  Lisfranc ligament is intact

Muscles and Tendons
Flexor, peroneal and extensor compartment tendons are intact.
Muscles are normal.

Soft tissue
No fluid collection or hematoma. No soft tissue mass. Soft tissue
edema along the dorsal aspect of the foot which may be reactive
secondary to venous insufficiency versus cellulitis.
IMPRESSION: 1. Nondisplaced fracture at the lateral base of the first proximal
phalanx with mild surrounding marrow edema.
2. Nondisplaced oblique fracture of the distal shaft of the first
metatarsal with surrounding marrow edema.
3. Nondisplaced fracture of the second metatarsal neck with marrow
edema in the second metatarsal shaft and second metatarsal head.
4. Nondisplaced fracture of third metatarsal head.
5. Nondisplaced fracture of the distal anterior calcaneus at the
calcaneocuboid joint.
6. No evidence of osteomyelitis.
